# Patient Record
Sex: Female | Born: 1965 | Race: White | Hispanic: No | Marital: Married | Smoking: Never smoker
Health system: Southern US, Community
[De-identification: ages and names within clinical notes are randomized; demographics above are authoritative.]

## PROBLEM LIST (undated history)

## (undated) ENCOUNTER — Emergency Department (HOSPITAL_BASED_OUTPATIENT_CLINIC_OR_DEPARTMENT_OTHER): Discharge status: Absent without leave | Payer: PRIVATE HEALTH INSURANCE

## (undated) DIAGNOSIS — I2510 Atherosclerotic heart disease of native coronary artery without angina pectoris: Secondary | ICD-10-CM

## (undated) DIAGNOSIS — A6920 Lyme disease, unspecified: Secondary | ICD-10-CM

## (undated) DIAGNOSIS — R51 Headache: Secondary | ICD-10-CM

## (undated) DIAGNOSIS — E7800 Pure hypercholesterolemia, unspecified: Secondary | ICD-10-CM

## (undated) DIAGNOSIS — R519 Headache, unspecified: Secondary | ICD-10-CM

## (undated) DIAGNOSIS — F32A Depression, unspecified: Secondary | ICD-10-CM

## (undated) DIAGNOSIS — R011 Cardiac murmur, unspecified: Secondary | ICD-10-CM

## (undated) DIAGNOSIS — G8929 Other chronic pain: Secondary | ICD-10-CM

## (undated) DIAGNOSIS — K7460 Unspecified cirrhosis of liver: Secondary | ICD-10-CM

## (undated) DIAGNOSIS — F419 Anxiety disorder, unspecified: Secondary | ICD-10-CM

## (undated) DIAGNOSIS — H409 Unspecified glaucoma: Secondary | ICD-10-CM

## (undated) DIAGNOSIS — I509 Heart failure, unspecified: Secondary | ICD-10-CM

## (undated) DIAGNOSIS — J45909 Unspecified asthma, uncomplicated: Secondary | ICD-10-CM

## (undated) DIAGNOSIS — M549 Dorsalgia, unspecified: Secondary | ICD-10-CM

## (undated) DIAGNOSIS — F319 Bipolar disorder, unspecified: Secondary | ICD-10-CM

## (undated) DIAGNOSIS — M1990 Unspecified osteoarthritis, unspecified site: Secondary | ICD-10-CM

## (undated) DIAGNOSIS — N183 Chronic kidney disease, stage 3 unspecified: Secondary | ICD-10-CM

## (undated) DIAGNOSIS — F329 Major depressive disorder, single episode, unspecified: Secondary | ICD-10-CM

## (undated) DIAGNOSIS — Z9981 Dependence on supplemental oxygen: Secondary | ICD-10-CM

## (undated) DIAGNOSIS — Z9289 Personal history of other medical treatment: Secondary | ICD-10-CM

## (undated) DIAGNOSIS — M797 Fibromyalgia: Secondary | ICD-10-CM

## (undated) DIAGNOSIS — E1129 Type 2 diabetes mellitus with other diabetic kidney complication: Secondary | ICD-10-CM

## (undated) DIAGNOSIS — H3320 Serous retinal detachment, unspecified eye: Secondary | ICD-10-CM

## (undated) HISTORY — DX: Atherosclerotic heart disease of native coronary artery without angina pectoris: I25.10

## (undated) HISTORY — DX: Major depressive disorder, single episode, unspecified: F32.9

## (undated) HISTORY — DX: Type 2 diabetes mellitus with other diabetic kidney complication: E11.29

## (undated) HISTORY — DX: Depression, unspecified: F32.A

## (undated) HISTORY — DX: Serous retinal detachment, unspecified eye: H33.20

## (undated) HISTORY — DX: Lyme disease, unspecified: A69.20

## (undated) HISTORY — PX: TUBAL LIGATION: SHX77

## (undated) HISTORY — DX: Heart failure, unspecified: I50.9

## (undated) HISTORY — PX: RETINAL DETACHMENT SURGERY: SHX105

## (undated) HISTORY — PX: LEG AMPUTATION BELOW KNEE: SHX694

## (undated) HISTORY — DX: Unspecified cirrhosis of liver: K74.60

## (undated) HISTORY — PX: TONSILLECTOMY: SUR1361

## (undated) HISTORY — PX: LASIK: SHX215

## (undated) HISTORY — PX: CORONARY ANGIOPLASTY WITH STENT PLACEMENT: SHX49

---

## 2011-06-28 DIAGNOSIS — Z9289 Personal history of other medical treatment: Secondary | ICD-10-CM

## 2011-06-28 HISTORY — DX: Personal history of other medical treatment: Z92.89

## 2011-12-29 DIAGNOSIS — F329 Major depressive disorder, single episode, unspecified: Secondary | ICD-10-CM | POA: Diagnosis not present

## 2012-02-02 DIAGNOSIS — F329 Major depressive disorder, single episode, unspecified: Secondary | ICD-10-CM | POA: Diagnosis not present

## 2012-02-10 DIAGNOSIS — L650 Telogen effluvium: Secondary | ICD-10-CM | POA: Diagnosis not present

## 2012-02-17 DIAGNOSIS — Z538 Procedure and treatment not carried out for other reasons: Secondary | ICD-10-CM | POA: Diagnosis not present

## 2012-03-06 DIAGNOSIS — F329 Major depressive disorder, single episode, unspecified: Secondary | ICD-10-CM | POA: Diagnosis not present

## 2012-03-23 DIAGNOSIS — L219 Seborrheic dermatitis, unspecified: Secondary | ICD-10-CM | POA: Diagnosis not present

## 2012-06-06 DIAGNOSIS — F329 Major depressive disorder, single episode, unspecified: Secondary | ICD-10-CM | POA: Diagnosis not present

## 2012-06-06 DIAGNOSIS — E119 Type 2 diabetes mellitus without complications: Secondary | ICD-10-CM | POA: Diagnosis not present

## 2012-06-06 DIAGNOSIS — I10 Essential (primary) hypertension: Secondary | ICD-10-CM | POA: Diagnosis not present

## 2012-06-06 DIAGNOSIS — I509 Heart failure, unspecified: Secondary | ICD-10-CM | POA: Diagnosis not present

## 2012-06-06 DIAGNOSIS — S88119A Complete traumatic amputation at level between knee and ankle, unspecified lower leg, initial encounter: Secondary | ICD-10-CM | POA: Diagnosis not present

## 2012-06-08 DIAGNOSIS — S88119A Complete traumatic amputation at level between knee and ankle, unspecified lower leg, initial encounter: Secondary | ICD-10-CM | POA: Diagnosis not present

## 2012-06-08 DIAGNOSIS — F329 Major depressive disorder, single episode, unspecified: Secondary | ICD-10-CM | POA: Diagnosis not present

## 2012-06-08 DIAGNOSIS — E119 Type 2 diabetes mellitus without complications: Secondary | ICD-10-CM | POA: Diagnosis not present

## 2012-06-08 DIAGNOSIS — I509 Heart failure, unspecified: Secondary | ICD-10-CM | POA: Diagnosis not present

## 2012-06-08 DIAGNOSIS — I10 Essential (primary) hypertension: Secondary | ICD-10-CM | POA: Diagnosis not present

## 2012-06-10 DIAGNOSIS — I509 Heart failure, unspecified: Secondary | ICD-10-CM | POA: Diagnosis not present

## 2012-06-10 DIAGNOSIS — E119 Type 2 diabetes mellitus without complications: Secondary | ICD-10-CM | POA: Diagnosis not present

## 2012-06-10 DIAGNOSIS — S88119A Complete traumatic amputation at level between knee and ankle, unspecified lower leg, initial encounter: Secondary | ICD-10-CM | POA: Diagnosis not present

## 2012-06-10 DIAGNOSIS — F329 Major depressive disorder, single episode, unspecified: Secondary | ICD-10-CM | POA: Diagnosis not present

## 2012-06-10 DIAGNOSIS — I10 Essential (primary) hypertension: Secondary | ICD-10-CM | POA: Diagnosis not present

## 2012-06-12 DIAGNOSIS — S88119A Complete traumatic amputation at level between knee and ankle, unspecified lower leg, initial encounter: Secondary | ICD-10-CM | POA: Diagnosis not present

## 2012-06-12 DIAGNOSIS — E119 Type 2 diabetes mellitus without complications: Secondary | ICD-10-CM | POA: Diagnosis not present

## 2012-06-12 DIAGNOSIS — I509 Heart failure, unspecified: Secondary | ICD-10-CM | POA: Diagnosis not present

## 2012-06-12 DIAGNOSIS — F329 Major depressive disorder, single episode, unspecified: Secondary | ICD-10-CM | POA: Diagnosis not present

## 2012-06-12 DIAGNOSIS — I10 Essential (primary) hypertension: Secondary | ICD-10-CM | POA: Diagnosis not present

## 2012-06-14 DIAGNOSIS — F329 Major depressive disorder, single episode, unspecified: Secondary | ICD-10-CM | POA: Diagnosis not present

## 2012-06-14 DIAGNOSIS — I509 Heart failure, unspecified: Secondary | ICD-10-CM | POA: Diagnosis not present

## 2012-06-14 DIAGNOSIS — S88119A Complete traumatic amputation at level between knee and ankle, unspecified lower leg, initial encounter: Secondary | ICD-10-CM | POA: Diagnosis not present

## 2012-06-14 DIAGNOSIS — E119 Type 2 diabetes mellitus without complications: Secondary | ICD-10-CM | POA: Diagnosis not present

## 2012-06-14 DIAGNOSIS — I10 Essential (primary) hypertension: Secondary | ICD-10-CM | POA: Diagnosis not present

## 2012-06-25 DIAGNOSIS — I509 Heart failure, unspecified: Secondary | ICD-10-CM | POA: Diagnosis not present

## 2012-06-25 DIAGNOSIS — F329 Major depressive disorder, single episode, unspecified: Secondary | ICD-10-CM | POA: Diagnosis not present

## 2012-06-25 DIAGNOSIS — I10 Essential (primary) hypertension: Secondary | ICD-10-CM | POA: Diagnosis not present

## 2012-06-25 DIAGNOSIS — E119 Type 2 diabetes mellitus without complications: Secondary | ICD-10-CM | POA: Diagnosis not present

## 2012-06-25 DIAGNOSIS — S88119A Complete traumatic amputation at level between knee and ankle, unspecified lower leg, initial encounter: Secondary | ICD-10-CM | POA: Diagnosis not present

## 2012-06-28 DIAGNOSIS — E119 Type 2 diabetes mellitus without complications: Secondary | ICD-10-CM | POA: Diagnosis not present

## 2012-06-28 DIAGNOSIS — I509 Heart failure, unspecified: Secondary | ICD-10-CM | POA: Diagnosis not present

## 2012-06-28 DIAGNOSIS — F329 Major depressive disorder, single episode, unspecified: Secondary | ICD-10-CM | POA: Diagnosis not present

## 2012-06-28 DIAGNOSIS — S88119A Complete traumatic amputation at level between knee and ankle, unspecified lower leg, initial encounter: Secondary | ICD-10-CM | POA: Diagnosis not present

## 2012-06-28 DIAGNOSIS — I10 Essential (primary) hypertension: Secondary | ICD-10-CM | POA: Diagnosis not present

## 2012-07-02 DIAGNOSIS — F329 Major depressive disorder, single episode, unspecified: Secondary | ICD-10-CM | POA: Diagnosis not present

## 2012-07-02 DIAGNOSIS — E119 Type 2 diabetes mellitus without complications: Secondary | ICD-10-CM | POA: Diagnosis not present

## 2012-07-02 DIAGNOSIS — I10 Essential (primary) hypertension: Secondary | ICD-10-CM | POA: Diagnosis not present

## 2012-07-02 DIAGNOSIS — I509 Heart failure, unspecified: Secondary | ICD-10-CM | POA: Diagnosis not present

## 2012-07-02 DIAGNOSIS — S88119A Complete traumatic amputation at level between knee and ankle, unspecified lower leg, initial encounter: Secondary | ICD-10-CM | POA: Diagnosis not present

## 2012-07-11 DIAGNOSIS — E119 Type 2 diabetes mellitus without complications: Secondary | ICD-10-CM | POA: Diagnosis not present

## 2012-07-11 DIAGNOSIS — S88119A Complete traumatic amputation at level between knee and ankle, unspecified lower leg, initial encounter: Secondary | ICD-10-CM | POA: Diagnosis not present

## 2012-07-11 DIAGNOSIS — F329 Major depressive disorder, single episode, unspecified: Secondary | ICD-10-CM | POA: Diagnosis not present

## 2012-07-11 DIAGNOSIS — I509 Heart failure, unspecified: Secondary | ICD-10-CM | POA: Diagnosis not present

## 2012-07-11 DIAGNOSIS — I10 Essential (primary) hypertension: Secondary | ICD-10-CM | POA: Diagnosis not present

## 2012-07-16 DIAGNOSIS — E119 Type 2 diabetes mellitus without complications: Secondary | ICD-10-CM | POA: Diagnosis not present

## 2012-07-16 DIAGNOSIS — I509 Heart failure, unspecified: Secondary | ICD-10-CM | POA: Diagnosis not present

## 2012-07-16 DIAGNOSIS — F329 Major depressive disorder, single episode, unspecified: Secondary | ICD-10-CM | POA: Diagnosis not present

## 2012-07-16 DIAGNOSIS — S88119A Complete traumatic amputation at level between knee and ankle, unspecified lower leg, initial encounter: Secondary | ICD-10-CM | POA: Diagnosis not present

## 2012-07-16 DIAGNOSIS — I10 Essential (primary) hypertension: Secondary | ICD-10-CM | POA: Diagnosis not present

## 2012-07-23 DIAGNOSIS — F329 Major depressive disorder, single episode, unspecified: Secondary | ICD-10-CM | POA: Diagnosis not present

## 2012-07-23 DIAGNOSIS — S88119A Complete traumatic amputation at level between knee and ankle, unspecified lower leg, initial encounter: Secondary | ICD-10-CM | POA: Diagnosis not present

## 2012-07-23 DIAGNOSIS — I509 Heart failure, unspecified: Secondary | ICD-10-CM | POA: Diagnosis not present

## 2012-07-23 DIAGNOSIS — E119 Type 2 diabetes mellitus without complications: Secondary | ICD-10-CM | POA: Diagnosis not present

## 2012-07-23 DIAGNOSIS — I10 Essential (primary) hypertension: Secondary | ICD-10-CM | POA: Diagnosis not present

## 2012-08-03 DIAGNOSIS — I509 Heart failure, unspecified: Secondary | ICD-10-CM | POA: Diagnosis not present

## 2012-08-03 DIAGNOSIS — E119 Type 2 diabetes mellitus without complications: Secondary | ICD-10-CM | POA: Diagnosis not present

## 2012-08-03 DIAGNOSIS — I10 Essential (primary) hypertension: Secondary | ICD-10-CM | POA: Diagnosis not present

## 2012-08-03 DIAGNOSIS — F329 Major depressive disorder, single episode, unspecified: Secondary | ICD-10-CM | POA: Diagnosis not present

## 2012-08-03 DIAGNOSIS — S88119A Complete traumatic amputation at level between knee and ankle, unspecified lower leg, initial encounter: Secondary | ICD-10-CM | POA: Diagnosis not present

## 2012-08-05 DIAGNOSIS — I509 Heart failure, unspecified: Secondary | ICD-10-CM | POA: Diagnosis not present

## 2012-08-05 DIAGNOSIS — E119 Type 2 diabetes mellitus without complications: Secondary | ICD-10-CM | POA: Diagnosis not present

## 2012-08-05 DIAGNOSIS — I10 Essential (primary) hypertension: Secondary | ICD-10-CM | POA: Diagnosis not present

## 2012-08-05 DIAGNOSIS — F329 Major depressive disorder, single episode, unspecified: Secondary | ICD-10-CM | POA: Diagnosis not present

## 2012-08-05 DIAGNOSIS — S88119A Complete traumatic amputation at level between knee and ankle, unspecified lower leg, initial encounter: Secondary | ICD-10-CM | POA: Diagnosis not present

## 2012-08-05 DIAGNOSIS — H548 Legal blindness, as defined in USA: Secondary | ICD-10-CM | POA: Diagnosis not present

## 2012-08-06 DIAGNOSIS — I10 Essential (primary) hypertension: Secondary | ICD-10-CM | POA: Diagnosis not present

## 2012-08-06 DIAGNOSIS — E119 Type 2 diabetes mellitus without complications: Secondary | ICD-10-CM | POA: Diagnosis not present

## 2012-08-06 DIAGNOSIS — F329 Major depressive disorder, single episode, unspecified: Secondary | ICD-10-CM | POA: Diagnosis not present

## 2012-08-06 DIAGNOSIS — I509 Heart failure, unspecified: Secondary | ICD-10-CM | POA: Diagnosis not present

## 2012-08-06 DIAGNOSIS — S88119A Complete traumatic amputation at level between knee and ankle, unspecified lower leg, initial encounter: Secondary | ICD-10-CM | POA: Diagnosis not present

## 2012-08-06 DIAGNOSIS — H548 Legal blindness, as defined in USA: Secondary | ICD-10-CM | POA: Diagnosis not present

## 2012-08-13 DIAGNOSIS — I509 Heart failure, unspecified: Secondary | ICD-10-CM | POA: Diagnosis not present

## 2012-08-13 DIAGNOSIS — I10 Essential (primary) hypertension: Secondary | ICD-10-CM | POA: Diagnosis not present

## 2012-08-13 DIAGNOSIS — S88119A Complete traumatic amputation at level between knee and ankle, unspecified lower leg, initial encounter: Secondary | ICD-10-CM | POA: Diagnosis not present

## 2012-08-13 DIAGNOSIS — H548 Legal blindness, as defined in USA: Secondary | ICD-10-CM | POA: Diagnosis not present

## 2012-08-13 DIAGNOSIS — E119 Type 2 diabetes mellitus without complications: Secondary | ICD-10-CM | POA: Diagnosis not present

## 2012-08-13 DIAGNOSIS — F329 Major depressive disorder, single episode, unspecified: Secondary | ICD-10-CM | POA: Diagnosis not present

## 2012-08-20 DIAGNOSIS — S88119A Complete traumatic amputation at level between knee and ankle, unspecified lower leg, initial encounter: Secondary | ICD-10-CM | POA: Diagnosis not present

## 2012-09-17 DIAGNOSIS — I10 Essential (primary) hypertension: Secondary | ICD-10-CM | POA: Diagnosis not present

## 2012-09-17 DIAGNOSIS — F329 Major depressive disorder, single episode, unspecified: Secondary | ICD-10-CM | POA: Diagnosis not present

## 2012-09-17 DIAGNOSIS — I509 Heart failure, unspecified: Secondary | ICD-10-CM | POA: Diagnosis not present

## 2012-09-17 DIAGNOSIS — H548 Legal blindness, as defined in USA: Secondary | ICD-10-CM | POA: Diagnosis not present

## 2012-09-17 DIAGNOSIS — E119 Type 2 diabetes mellitus without complications: Secondary | ICD-10-CM | POA: Diagnosis not present

## 2012-09-17 DIAGNOSIS — S88119A Complete traumatic amputation at level between knee and ankle, unspecified lower leg, initial encounter: Secondary | ICD-10-CM | POA: Diagnosis not present

## 2012-09-25 DIAGNOSIS — F329 Major depressive disorder, single episode, unspecified: Secondary | ICD-10-CM | POA: Diagnosis not present

## 2012-09-25 DIAGNOSIS — I10 Essential (primary) hypertension: Secondary | ICD-10-CM | POA: Diagnosis not present

## 2012-09-25 DIAGNOSIS — H548 Legal blindness, as defined in USA: Secondary | ICD-10-CM | POA: Diagnosis not present

## 2012-09-25 DIAGNOSIS — I509 Heart failure, unspecified: Secondary | ICD-10-CM | POA: Diagnosis not present

## 2012-09-25 DIAGNOSIS — S88119A Complete traumatic amputation at level between knee and ankle, unspecified lower leg, initial encounter: Secondary | ICD-10-CM | POA: Diagnosis not present

## 2012-09-25 DIAGNOSIS — E119 Type 2 diabetes mellitus without complications: Secondary | ICD-10-CM | POA: Diagnosis not present

## 2012-10-01 DIAGNOSIS — I509 Heart failure, unspecified: Secondary | ICD-10-CM | POA: Diagnosis not present

## 2012-10-01 DIAGNOSIS — E119 Type 2 diabetes mellitus without complications: Secondary | ICD-10-CM | POA: Diagnosis not present

## 2012-10-01 DIAGNOSIS — H548 Legal blindness, as defined in USA: Secondary | ICD-10-CM | POA: Diagnosis not present

## 2012-10-01 DIAGNOSIS — F329 Major depressive disorder, single episode, unspecified: Secondary | ICD-10-CM | POA: Diagnosis not present

## 2012-10-01 DIAGNOSIS — I10 Essential (primary) hypertension: Secondary | ICD-10-CM | POA: Diagnosis not present

## 2012-10-01 DIAGNOSIS — S88119A Complete traumatic amputation at level between knee and ankle, unspecified lower leg, initial encounter: Secondary | ICD-10-CM | POA: Diagnosis not present

## 2012-12-03 DIAGNOSIS — I509 Heart failure, unspecified: Secondary | ICD-10-CM | POA: Diagnosis not present

## 2012-12-03 DIAGNOSIS — F319 Bipolar disorder, unspecified: Secondary | ICD-10-CM | POA: Diagnosis not present

## 2012-12-03 DIAGNOSIS — H548 Legal blindness, as defined in USA: Secondary | ICD-10-CM | POA: Diagnosis not present

## 2012-12-03 DIAGNOSIS — E119 Type 2 diabetes mellitus without complications: Secondary | ICD-10-CM | POA: Diagnosis not present

## 2012-12-03 DIAGNOSIS — I2510 Atherosclerotic heart disease of native coronary artery without angina pectoris: Secondary | ICD-10-CM | POA: Diagnosis not present

## 2012-12-03 DIAGNOSIS — S88119A Complete traumatic amputation at level between knee and ankle, unspecified lower leg, initial encounter: Secondary | ICD-10-CM | POA: Diagnosis not present

## 2012-12-10 DIAGNOSIS — F319 Bipolar disorder, unspecified: Secondary | ICD-10-CM | POA: Diagnosis not present

## 2012-12-10 DIAGNOSIS — I2510 Atherosclerotic heart disease of native coronary artery without angina pectoris: Secondary | ICD-10-CM | POA: Diagnosis not present

## 2012-12-10 DIAGNOSIS — S88119A Complete traumatic amputation at level between knee and ankle, unspecified lower leg, initial encounter: Secondary | ICD-10-CM | POA: Diagnosis not present

## 2012-12-10 DIAGNOSIS — E119 Type 2 diabetes mellitus without complications: Secondary | ICD-10-CM | POA: Diagnosis not present

## 2012-12-10 DIAGNOSIS — I509 Heart failure, unspecified: Secondary | ICD-10-CM | POA: Diagnosis not present

## 2012-12-10 DIAGNOSIS — H548 Legal blindness, as defined in USA: Secondary | ICD-10-CM | POA: Diagnosis not present

## 2012-12-24 DIAGNOSIS — I509 Heart failure, unspecified: Secondary | ICD-10-CM | POA: Diagnosis not present

## 2012-12-24 DIAGNOSIS — H548 Legal blindness, as defined in USA: Secondary | ICD-10-CM | POA: Diagnosis not present

## 2012-12-24 DIAGNOSIS — S88119A Complete traumatic amputation at level between knee and ankle, unspecified lower leg, initial encounter: Secondary | ICD-10-CM | POA: Diagnosis not present

## 2012-12-24 DIAGNOSIS — E119 Type 2 diabetes mellitus without complications: Secondary | ICD-10-CM | POA: Diagnosis not present

## 2012-12-24 DIAGNOSIS — F319 Bipolar disorder, unspecified: Secondary | ICD-10-CM | POA: Diagnosis not present

## 2012-12-24 DIAGNOSIS — I2510 Atherosclerotic heart disease of native coronary artery without angina pectoris: Secondary | ICD-10-CM | POA: Diagnosis not present

## 2012-12-31 DIAGNOSIS — I509 Heart failure, unspecified: Secondary | ICD-10-CM | POA: Diagnosis not present

## 2012-12-31 DIAGNOSIS — S88119A Complete traumatic amputation at level between knee and ankle, unspecified lower leg, initial encounter: Secondary | ICD-10-CM | POA: Diagnosis not present

## 2012-12-31 DIAGNOSIS — H548 Legal blindness, as defined in USA: Secondary | ICD-10-CM | POA: Diagnosis not present

## 2012-12-31 DIAGNOSIS — I2510 Atherosclerotic heart disease of native coronary artery without angina pectoris: Secondary | ICD-10-CM | POA: Diagnosis not present

## 2012-12-31 DIAGNOSIS — E119 Type 2 diabetes mellitus without complications: Secondary | ICD-10-CM | POA: Diagnosis not present

## 2012-12-31 DIAGNOSIS — F319 Bipolar disorder, unspecified: Secondary | ICD-10-CM | POA: Diagnosis not present

## 2013-01-07 DIAGNOSIS — I509 Heart failure, unspecified: Secondary | ICD-10-CM | POA: Diagnosis not present

## 2013-01-07 DIAGNOSIS — H548 Legal blindness, as defined in USA: Secondary | ICD-10-CM | POA: Diagnosis not present

## 2013-01-07 DIAGNOSIS — F319 Bipolar disorder, unspecified: Secondary | ICD-10-CM | POA: Diagnosis not present

## 2013-01-07 DIAGNOSIS — S88119A Complete traumatic amputation at level between knee and ankle, unspecified lower leg, initial encounter: Secondary | ICD-10-CM | POA: Diagnosis not present

## 2013-01-07 DIAGNOSIS — I2510 Atherosclerotic heart disease of native coronary artery without angina pectoris: Secondary | ICD-10-CM | POA: Diagnosis not present

## 2013-01-07 DIAGNOSIS — E119 Type 2 diabetes mellitus without complications: Secondary | ICD-10-CM | POA: Diagnosis not present

## 2013-01-14 DIAGNOSIS — I509 Heart failure, unspecified: Secondary | ICD-10-CM | POA: Diagnosis not present

## 2013-01-14 DIAGNOSIS — E119 Type 2 diabetes mellitus without complications: Secondary | ICD-10-CM | POA: Diagnosis not present

## 2013-01-14 DIAGNOSIS — F319 Bipolar disorder, unspecified: Secondary | ICD-10-CM | POA: Diagnosis not present

## 2013-01-14 DIAGNOSIS — H548 Legal blindness, as defined in USA: Secondary | ICD-10-CM | POA: Diagnosis not present

## 2013-01-14 DIAGNOSIS — S88119A Complete traumatic amputation at level between knee and ankle, unspecified lower leg, initial encounter: Secondary | ICD-10-CM | POA: Diagnosis not present

## 2013-01-14 DIAGNOSIS — I2510 Atherosclerotic heart disease of native coronary artery without angina pectoris: Secondary | ICD-10-CM | POA: Diagnosis not present

## 2013-01-23 DIAGNOSIS — S88119A Complete traumatic amputation at level between knee and ankle, unspecified lower leg, initial encounter: Secondary | ICD-10-CM | POA: Diagnosis not present

## 2013-01-23 DIAGNOSIS — I2510 Atherosclerotic heart disease of native coronary artery without angina pectoris: Secondary | ICD-10-CM | POA: Diagnosis not present

## 2013-01-23 DIAGNOSIS — F319 Bipolar disorder, unspecified: Secondary | ICD-10-CM | POA: Diagnosis not present

## 2013-01-23 DIAGNOSIS — E119 Type 2 diabetes mellitus without complications: Secondary | ICD-10-CM | POA: Diagnosis not present

## 2013-01-23 DIAGNOSIS — I509 Heart failure, unspecified: Secondary | ICD-10-CM | POA: Diagnosis not present

## 2013-01-23 DIAGNOSIS — H548 Legal blindness, as defined in USA: Secondary | ICD-10-CM | POA: Diagnosis not present

## 2013-01-31 DIAGNOSIS — H548 Legal blindness, as defined in USA: Secondary | ICD-10-CM | POA: Diagnosis not present

## 2013-01-31 DIAGNOSIS — I509 Heart failure, unspecified: Secondary | ICD-10-CM | POA: Diagnosis not present

## 2013-01-31 DIAGNOSIS — E119 Type 2 diabetes mellitus without complications: Secondary | ICD-10-CM | POA: Diagnosis not present

## 2013-01-31 DIAGNOSIS — S88119A Complete traumatic amputation at level between knee and ankle, unspecified lower leg, initial encounter: Secondary | ICD-10-CM | POA: Diagnosis not present

## 2013-01-31 DIAGNOSIS — I2510 Atherosclerotic heart disease of native coronary artery without angina pectoris: Secondary | ICD-10-CM | POA: Diagnosis not present

## 2013-01-31 DIAGNOSIS — F319 Bipolar disorder, unspecified: Secondary | ICD-10-CM | POA: Diagnosis not present

## 2013-08-26 DIAGNOSIS — I2589 Other forms of chronic ischemic heart disease: Secondary | ICD-10-CM | POA: Diagnosis not present

## 2013-08-26 DIAGNOSIS — I5022 Chronic systolic (congestive) heart failure: Secondary | ICD-10-CM | POA: Diagnosis not present

## 2013-08-26 DIAGNOSIS — I509 Heart failure, unspecified: Secondary | ICD-10-CM | POA: Diagnosis not present

## 2013-08-26 DIAGNOSIS — R0602 Shortness of breath: Secondary | ICD-10-CM | POA: Diagnosis not present

## 2013-08-27 DIAGNOSIS — I10 Essential (primary) hypertension: Secondary | ICD-10-CM | POA: Diagnosis not present

## 2013-08-27 DIAGNOSIS — I2510 Atherosclerotic heart disease of native coronary artery without angina pectoris: Secondary | ICD-10-CM | POA: Diagnosis not present

## 2013-08-27 DIAGNOSIS — I5022 Chronic systolic (congestive) heart failure: Secondary | ICD-10-CM | POA: Diagnosis not present

## 2013-08-27 DIAGNOSIS — E119 Type 2 diabetes mellitus without complications: Secondary | ICD-10-CM | POA: Diagnosis not present

## 2013-08-27 DIAGNOSIS — R0602 Shortness of breath: Secondary | ICD-10-CM | POA: Diagnosis not present

## 2013-08-27 DIAGNOSIS — I509 Heart failure, unspecified: Secondary | ICD-10-CM | POA: Diagnosis not present

## 2013-10-31 DIAGNOSIS — I2510 Atherosclerotic heart disease of native coronary artery without angina pectoris: Secondary | ICD-10-CM | POA: Diagnosis not present

## 2013-10-31 DIAGNOSIS — S88119A Complete traumatic amputation at level between knee and ankle, unspecified lower leg, initial encounter: Secondary | ICD-10-CM | POA: Diagnosis not present

## 2013-10-31 DIAGNOSIS — E1159 Type 2 diabetes mellitus with other circulatory complications: Secondary | ICD-10-CM | POA: Diagnosis not present

## 2013-10-31 DIAGNOSIS — I10 Essential (primary) hypertension: Secondary | ICD-10-CM | POA: Diagnosis not present

## 2014-06-25 DIAGNOSIS — I5022 Chronic systolic (congestive) heart failure: Secondary | ICD-10-CM | POA: Diagnosis not present

## 2014-06-25 DIAGNOSIS — E1165 Type 2 diabetes mellitus with hyperglycemia: Secondary | ICD-10-CM | POA: Diagnosis not present

## 2014-06-25 DIAGNOSIS — R0602 Shortness of breath: Secondary | ICD-10-CM | POA: Diagnosis not present

## 2014-06-25 DIAGNOSIS — I2510 Atherosclerotic heart disease of native coronary artery without angina pectoris: Secondary | ICD-10-CM | POA: Diagnosis not present

## 2014-09-09 ENCOUNTER — Ambulatory Visit: Payer: Self-pay | Admitting: Family Medicine

## 2014-09-12 DIAGNOSIS — E876 Hypokalemia: Secondary | ICD-10-CM | POA: Diagnosis not present

## 2014-09-12 DIAGNOSIS — K5900 Constipation, unspecified: Secondary | ICD-10-CM | POA: Diagnosis not present

## 2014-09-12 DIAGNOSIS — H6090 Unspecified otitis externa, unspecified ear: Secondary | ICD-10-CM | POA: Diagnosis not present

## 2014-09-12 DIAGNOSIS — L723 Sebaceous cyst: Secondary | ICD-10-CM | POA: Diagnosis not present

## 2014-09-12 DIAGNOSIS — E1159 Type 2 diabetes mellitus with other circulatory complications: Secondary | ICD-10-CM | POA: Diagnosis not present

## 2014-10-09 DIAGNOSIS — N19 Unspecified kidney failure: Secondary | ICD-10-CM | POA: Diagnosis not present

## 2014-10-09 DIAGNOSIS — I5022 Chronic systolic (congestive) heart failure: Secondary | ICD-10-CM | POA: Diagnosis not present

## 2014-11-28 ENCOUNTER — Encounter: Payer: Self-pay | Admitting: Family Medicine

## 2014-11-28 ENCOUNTER — Ambulatory Visit (INDEPENDENT_AMBULATORY_CARE_PROVIDER_SITE_OTHER): Payer: Medicare Other | Admitting: Family Medicine

## 2014-11-28 VITALS — BP 110/80 | HR 95 | Temp 98.7°F

## 2014-11-28 DIAGNOSIS — E1121 Type 2 diabetes mellitus with diabetic nephropathy: Secondary | ICD-10-CM | POA: Diagnosis not present

## 2014-11-28 DIAGNOSIS — I5023 Acute on chronic systolic (congestive) heart failure: Secondary | ICD-10-CM | POA: Insufficient documentation

## 2014-11-28 DIAGNOSIS — K7460 Unspecified cirrhosis of liver: Secondary | ICD-10-CM

## 2014-11-28 DIAGNOSIS — Z89512 Acquired absence of left leg below knee: Secondary | ICD-10-CM

## 2014-11-28 DIAGNOSIS — H548 Legal blindness, as defined in USA: Secondary | ICD-10-CM

## 2014-11-28 DIAGNOSIS — I5022 Chronic systolic (congestive) heart failure: Secondary | ICD-10-CM

## 2014-11-28 DIAGNOSIS — Z89511 Acquired absence of right leg below knee: Secondary | ICD-10-CM

## 2014-11-28 DIAGNOSIS — M25569 Pain in unspecified knee: Secondary | ICD-10-CM

## 2014-11-28 DIAGNOSIS — I25810 Atherosclerosis of coronary artery bypass graft(s) without angina pectoris: Secondary | ICD-10-CM | POA: Diagnosis not present

## 2014-11-28 DIAGNOSIS — M159 Polyosteoarthritis, unspecified: Secondary | ICD-10-CM | POA: Diagnosis not present

## 2014-11-28 NOTE — Progress Notes (Signed)
HPI:  Sue Martin is here to establish care. [place]   Has the following chronic problems that require follow up and concerns today:  DM: -complications: s/p bilat LE amputation and on disability for this - reports spent 45 days at [facility] 2012 for gangrene, PVD, Renal Failure, diabetic retinopathy -meds:lantus 40 U bid, lispro 15 units per meal -fasting BS in 250-400 chronically per her report -she is frustrated with this, agreeable to seeing endocrinologist -sees optho/legally blind: sees Dr Johna SheriffPeter Rogaski  CKD: -from her diabetes  -on dialysis in the past -reports needs a nephrologist but prior doc did not refer her, wants referral -reports her potassium is always low - has required potassium supplementation in the past but she does not tolerated these well and reports will not take -rarely uses ibuprofen  Hx of CAD, CHF: -S/p stenting Jan of 2007 [facility] [place], [place] -[place] cardi[place] in [place] now Dr. Wynonia HazardKhawaja - reports EF is around 4341 now and has appointment coming up -meds: asa, plavix, lipitor, lisinopril, lasix, metalazone, spironolactone  OA: -knees and back -takes ibuprofen rarely for this  Chronic muscle spasm around knees: -reports since amputation -wants referral to PT  Liver cirrhosis: -reports diagnosed in 2013, reports told this was from her diabetes -reports had extensive testing for hepatitis and does not have hepatitis  Chronic allergies: -takes benadryl at night -reports will not use nose sprays -chronic nasal congestion and fullness and popping in ears  ROS negative for unless reported above: fevers, unintentional weight loss, hearing or vision loss, chest pain, palpitations, struggling to breath, hemoptysis, melena, hematochezia, hematuria, falls, loc, si, thoughts of self harm  Past Medical History  Diagnosis Date  . Diabetes mellitus with renal complications   . CAD  (coronary artery disease)   . CHF (congestive heart failure)   . Arteriosclerosis   . Chronic kidney disease   . Liver cirrhosis   . Retinal detachment   . Lyme disease   . Glaucoma     Past Surgical History  Procedure Laterality Date  . Leg amputation below knee  09/09/2011, 09/11/2011  . Tubal ligation    . Lasik      Family History  Problem Relation Age of Onset  . Arthritis Mother   . Arthritis Maternal Grandmother   . Arthritis Father   . Heart disease Mother   . CVA Father   . Hypertension Father   . Sudden death Paternal Grandfather   . Mental illness Mother   . Diabetes Mother     History   Social History  . Marital Status: Married    Spouse Name: N/A  . Number of Children: N/A  . Years of Education: N/A   Social History Main Topics  . Smoking status: Never Smoker   . Smokeless tobacco: Not on file  . Alcohol Use: No  . Drug Use: No  . Sexual Activity: Not on file   Other Topics Concern  . None   Social History Narrative     Current outpatient prescriptions:  .  aspirin 325 MG tablet, Take 325 mg by mouth., Disp: , Rfl:  .  atorvastatin (LIPITOR) 10 MG tablet, Take 10 mg by mouth daily., Disp: , Rfl:  .  clopidogrel (PLAVIX) 75 MG tablet, Take 75 mg by mouth daily. , Disp: , Rfl:  .  cyclobenzaprine (FLEXERIL) 10 MG tablet, Take 10 mg by mouth as needed. , Disp: , Rfl:  .  diphenhydrAMINE (BENADRYL) 50 MG capsule, Take 50 mg by mouth daily. , Disp: , Rfl:  .  glucose blood test strip, Check sugars four times daily   Dx E11.9, Disp: , Rfl:  .  insulin glargine (LANTUS) 100 UNIT/ML injection, Inject 40 Units into the skin 2 (two) times daily., Disp: , Rfl:  .  insulin lispro (HUMALOG) 100 UNIT/ML injection, Inject 15 Units into the skin 3 (three) times daily with meals. , Disp: , Rfl:  .  lisinopril (PRINIVIL,ZESTRIL) 10 MG tablet, Take 10 mg by mouth., Disp: , Rfl:  .  metolazone (ZAROXOLYN) 5 MG tablet, Take by mouth daily. 30 minutes before Demadex  dose, Disp: , Rfl:  .  spironolactone (ALDACTONE) 25 MG tablet, Take 25 mg by mouth daily., Disp: , Rfl:  .  torsemide (DEMADEX) 20 MG tablet, TAKE ONE TABLET BY MOUTH TWICE DAILY, Disp: , Rfl:  .  ibuprofen (ADVIL,MOTRIN) 200 MG tablet, Take 200 mg by mouth as needed., Disp: , Rfl:   EXAM:  Filed Vitals:   11/28/14 1419  BP: 110/80  Pulse: 95  Temp: 98.7 F (37.1 C)    There is no height or weight on file to calculate BMI.  GENERAL: vitals reviewed and listed above, alert, oriented, appears well hydrated and in no acute distress  HEENT: atraumatic, conjunttiva clear, no obvious abnormalities on inspection of external nose and ears  NECK: no obvious masses on inspection  LUNGS: clear to auscultation bilaterally, no wheezes, rales or rhonchi, good air movement  CV: HRRR, no peripheral edema  MS: bilateral BKA  PSYCH: pleasant and cooperative, no obvious depression or anxiety  ASSESSMENT AND PLAN:  Discussed the following assessment and plan:  >45 minutes spent with this patient with > 50% face to face in counseling of this patient. She has a very complicated health history which we attempted to review and update. We plan to start with some basic labs today and have her see the endocrinologist for her uncontrolled diabetes. We will have her follow up relatively closely after attempting to obtain records so that we can continue to learn more about her health history and help her with her healthcare needs.  Type 2 diabetes mellitus with diabetic nephropathy - Plan: CMP, Hemoglobin A1c -referred per her request to endocrinologist for management of her severe, uncontrolled diabetes with multiple complications  CHF Coronary artery disease involving coronary bypass graft of native heart without angina pectoris - Plan: CMP, Lipid Panel -advised she have her cardiologist send records and that her cardiologist manage her heart medications  Generalized OA Caution with nsaids given  renal disease.  Cirrhosis of liver without ascites, unspecified hepatic cirrhosis type Caution with liver toxic drugs.  -We reviewed the PMH, PSH, FH, SH, Meds and Allergies. -We provided refills for any medications we will prescribe as needed. -We addressed current concerns per orders and patient instructions. -We have asked for records for pertinent exams, studies, vaccines and notes from previous providers. -We have advised patient to follow up per instructions below.   -Patient advised to return or notify a doctor immediately if symptoms worsen or persist or new concerns arise.  Patient Instructions  BEFORE YOU LEAVE: -schedule fasting lab appointment in the next 1-2 weeks -schedule follow up with me in 2-3 months  -We placed a referral for you as discussed to the endocrinologist and to the physical therapist. It usually takes about 1-2 weeks to process and schedule this referral. If you have not heard from [place] regarding  this appointment in 2 weeks please contact our office.  Stop the benadryl and start zyrtec daily - this is available over the counter  .      Kriste Basque R.

## 2014-11-28 NOTE — Progress Notes (Signed)
Pre visit review using our clinic review tool, if applicable. No additional management support is needed unless otherwise documented below in the visit note. 

## 2014-11-28 NOTE — Patient Instructions (Signed)
BEFORE YOU LEAVE: -schedule fasting lab appointment in the next 1-2 weeks -schedule follow up with me in 2-3 months  -We placed a referral for you as discussed to the endocrinologist and to the physical therapist. It usually takes about 1-2 weeks to process and schedule this referral. If you have not heard from us regarding this appointment in 2 weeks please contact our office.  Stop the benadryl and start zyrtec daily - this is available over the counter  .

## 2014-12-01 ENCOUNTER — Other Ambulatory Visit (INDEPENDENT_AMBULATORY_CARE_PROVIDER_SITE_OTHER): Payer: Medicare Other

## 2014-12-01 DIAGNOSIS — I25810 Atherosclerosis of coronary artery bypass graft(s) without angina pectoris: Secondary | ICD-10-CM | POA: Diagnosis not present

## 2014-12-01 DIAGNOSIS — E1121 Type 2 diabetes mellitus with diabetic nephropathy: Secondary | ICD-10-CM | POA: Diagnosis not present

## 2014-12-01 LAB — COMPREHENSIVE METABOLIC PANEL
ALT: 10 U/L (ref 0–35)
AST: 13 U/L (ref 0–37)
Albumin: 3.8 g/dL (ref 3.5–5.2)
Alkaline Phosphatase: 67 U/L (ref 39–117)
BUN: 53 mg/dL — ABNORMAL HIGH (ref 6–23)
CALCIUM: 9.3 mg/dL (ref 8.4–10.5)
CHLORIDE: 104 meq/L (ref 96–112)
CO2: 27 mEq/L (ref 19–32)
CREATININE: 1.86 mg/dL — AB (ref 0.40–1.20)
GFR: 30.75 mL/min — ABNORMAL LOW (ref >60.00)
Glucose, Bld: 181 mg/dL — ABNORMAL HIGH (ref 70–99)
Potassium: 4.8 mEq/L (ref 3.5–5.1)
Sodium: 139 mEq/L (ref 135–145)
Total Bilirubin: 0.5 mg/dL (ref 0.2–1.2)
Total Protein: 6.7 g/dL (ref 6.0–8.3)

## 2014-12-01 LAB — HEMOGLOBIN A1C: Hgb A1c MFr Bld: 8.9 % — ABNORMAL HIGH (ref 4.6–6.5)

## 2014-12-01 LAB — LIPID PANEL
Cholesterol: 161 mg/dL (ref 0–200)
HDL: 34.8 mg/dL — ABNORMAL LOW
LDL Cholesterol: 97 mg/dL (ref 0–99)
NonHDL: 126.2
Total CHOL/HDL Ratio: 5
Triglycerides: 147 mg/dL (ref 0.0–149.0)
VLDL: 29.4 mg/dL (ref 0.0–40.0)

## 2014-12-08 ENCOUNTER — Other Ambulatory Visit: Payer: Self-pay | Admitting: Family Medicine

## 2014-12-08 DIAGNOSIS — N183 Chronic kidney disease, stage 3 unspecified: Secondary | ICD-10-CM

## 2014-12-10 ENCOUNTER — Ambulatory Visit: Payer: Medicare Other

## 2014-12-12 ENCOUNTER — Encounter: Payer: Self-pay | Admitting: Family Medicine

## 2014-12-12 DIAGNOSIS — I739 Peripheral vascular disease, unspecified: Secondary | ICD-10-CM | POA: Insufficient documentation

## 2014-12-12 DIAGNOSIS — E11319 Type 2 diabetes mellitus with unspecified diabetic retinopathy without macular edema: Secondary | ICD-10-CM | POA: Insufficient documentation

## 2014-12-12 DIAGNOSIS — I2510 Atherosclerotic heart disease of native coronary artery without angina pectoris: Secondary | ICD-10-CM

## 2014-12-12 DIAGNOSIS — N183 Chronic kidney disease, stage 3 unspecified: Secondary | ICD-10-CM | POA: Insufficient documentation

## 2014-12-12 HISTORY — DX: Atherosclerotic heart disease of native coronary artery without angina pectoris: I25.10

## 2014-12-22 ENCOUNTER — Ambulatory Visit (INDEPENDENT_AMBULATORY_CARE_PROVIDER_SITE_OTHER): Payer: Medicare Other | Admitting: Endocrinology

## 2014-12-22 ENCOUNTER — Encounter: Payer: Self-pay | Admitting: Endocrinology

## 2014-12-22 VITALS — BP 120/80 | HR 100 | Temp 99.1°F | Resp 16 | Ht <= 58 in

## 2014-12-22 DIAGNOSIS — I25810 Atherosclerosis of coronary artery bypass graft(s) without angina pectoris: Secondary | ICD-10-CM

## 2014-12-22 DIAGNOSIS — E1139 Type 2 diabetes mellitus with other diabetic ophthalmic complication: Secondary | ICD-10-CM | POA: Diagnosis not present

## 2014-12-22 DIAGNOSIS — E785 Hyperlipidemia, unspecified: Secondary | ICD-10-CM | POA: Diagnosis not present

## 2014-12-22 DIAGNOSIS — E1165 Type 2 diabetes mellitus with hyperglycemia: Secondary | ICD-10-CM

## 2014-12-22 DIAGNOSIS — N183 Chronic kidney disease, stage 3 unspecified: Secondary | ICD-10-CM

## 2014-12-22 DIAGNOSIS — IMO0002 Reserved for concepts with insufficient information to code with codable children: Secondary | ICD-10-CM

## 2014-12-22 LAB — POCT URINALYSIS DIPSTICK
Bilirubin, UA: NEGATIVE
Blood, UA: NEGATIVE
GLUCOSE UA: 250
Ketones, UA: NEGATIVE
Leukocytes, UA: NEGATIVE
NITRITE UA: NEGATIVE
Spec Grav, UA: 1.015
UROBILINOGEN UA: 0.2
pH, UA: 6

## 2014-12-22 LAB — MICROALBUMIN / CREATININE URINE RATIO
CREATININE, U: 102.6 mg/dL
Microalb Creat Ratio: 17.4 mg/g (ref 0.0–30.0)
Microalb, Ur: 17.8 mg/dL — ABNORMAL HIGH (ref 0.0–1.9)

## 2014-12-22 MED ORDER — DULAGLUTIDE 0.75 MG/0.5ML ~~LOC~~ SOAJ: SUBCUTANEOUS | Status: DC

## 2014-12-22 NOTE — Progress Notes (Signed)
Patient ID: Sue Martin, female   DOB: [birth year], 49 y.o.   MRN: [id number]           Reason for Appointment: Consultation for Type 2 Diabetes  Referring physician: None  History of Present Illness:          Date of diagnosis of type 2 diabetes mellitus: ?  2007        Background history:  She had gestational diabetes several years ago Subsequently she was not monitored and apparently in 2007 she was diagnosed to have diabetes when she was being evaluated for cardiac problems Pneumonia.  Her blood sugar was about 700 She was started on insulin and has only been on insulin since diagnosis She thinks her blood sugars have been usually very difficult to control and has been generally followed by her primary care physician only Her A1c record indicates her level has been mostly around 10-11 in 2015 and early 2016  Recent history:  Her insulin dose has been continued unchanged recently and she has been referred here when her A1c was 8.9 earlier this month  INSULIN regimen is described as: 40 units of Lantus bid Humalog 15 units with meals     Current blood sugar patterns and problems identified:  She is taking her Humalog usually about an hour or 2 after eating since she was instructed to do so in the hospital  Although she is checking her blood sugars 4 times a day she has to use a talking meter and is not able to keep a record  She thinks her blood sugars are mostly around 250 but generally higher towards bedtime and also if she checks them after meals  She does not adjust her Humalog based on what she is eating She tends to get more fatigue when her blood sugars are relatively higher: Usually tries to drink water when she is thirsty and avoid drinks with sugar  Compliance with the medical regimen: Fair Hypoglycemia: never   Glucose monitoring:  done 3-4 times a day         Glucometer: Prodigy.      Blood Glucose readings by recall   PREMEAL Breakfast Lunch Dinner Bedtime   Overall   Glucose range: 250  250   250  300-450   Median:        Self-care: The diet that the patient has been following is: tries to limit portions .     Meal times: Breakfast: 10 am Lunch: 3-4 pm, usually not eating a full dinner   Typical meal intake: Breakfast is his usually a granola bar or light yogurt, lunch may be sandwich or pasta and soups, dinner is usually yogurt or other snacks like fruit or sugar-free cookies               Dietician visit, most recent: none, only in the hospital               Exercise:  none  Weight history: Around the time of diagnosis overweight was 350, recently has been fairly stable around 175  Wt Readings from Last 3 Encounters:  No data found for Wt    Glycemic control:   Lab Results  Component Value Date   HGBA1C 8.9* 12/01/2014   Lab Results  Component Value Date   MICROALBUR 17.8* 12/22/2014   LDLCALC 97 12/01/2014   CREATININE 1.86* 12/01/2014         Medication List       This list is accurate  as of: 12/22/14  8:38 PM.  Always use your most recent med list.               aspirin 325 MG tablet  Take 325 mg by mouth.     atorvastatin 10 MG tablet  Commonly known as:  LIPITOR  Take 10 mg by mouth daily.     clopidogrel 75 MG tablet  Commonly known as:  PLAVIX  Take 75 mg by mouth daily.     cyclobenzaprine 10 MG tablet  Commonly known as:  FLEXERIL  Take 10 mg by mouth as needed.     diphenhydrAMINE 50 MG capsule  Commonly known as:  BENADRYL  Take 50 mg by mouth daily.     Dulaglutide 0.75 MG/0.5ML Sopn  Commonly known as:  TRULICITY  Inject once weekly     glucose blood test strip  Check sugars four times daily   Dx E11.9     ibuprofen 200 MG tablet  Commonly known as:  ADVIL,MOTRIN  Take 200 mg by mouth as needed.     insulin glargine 100 UNIT/ML injection  Commonly known as:  LANTUS  Inject 40 Units into the skin 2 (two) times daily.     LANTUS SOLOSTAR 100 UNIT/ML Solostar Pen  Generic drug:   Insulin Glargine  Inject 100 mLs into the skin 2 (two) times daily.     insulin lispro 100 UNIT/ML injection  Commonly known as:  HUMALOG  Inject 15 Units into the skin 3 (three) times daily with meals.     lisinopril 10 MG tablet  Commonly known as:  PRINIVIL,ZESTRIL  Take 10 mg by mouth.     metolazone 5 MG tablet  Commonly known as:  ZAROXOLYN  Take by mouth daily. 30 minutes before Demadex dose     spironolactone 25 MG tablet  Commonly known as:  ALDACTONE  Take 25 mg by mouth daily.     torsemide 20 MG tablet  Commonly known as:  DEMADEX  TAKE ONE TABLET BY MOUTH TWICE DAILY        Allergies:  Allergies  Allergen Reactions  . Broccoli [Brassica Oleracea Italica] Anaphylaxis  . Iron Other (See Comments)    GI intolerance  . Latex Other (See Comments)    blisters  . Penicillins Other (See Comments)    GI intolerance  . Codeine Nausea And Vomiting and Rash  . Sulfa Antibiotics Nausea And Vomiting and Rash    Past Medical History  Diagnosis Date  . Diabetes mellitus with renal complications     PVD and retinopathy  . CHF (congestive heart failure)   . Arteriosclerosis   . Chronic kidney disease   . Liver cirrhosis   . Retinal detachment   . Lyme disease   . Glaucoma   . CAD (coronary artery disease) 12/12/2014    -s/p 2V PCI of the LAD/RCA with taxus stents -cardiologist is Dr. Carmon Sails, [place]   . Depression     Past Surgical History  Procedure Laterality Date  . Leg amputation below knee  09/09/2011, 09/11/2011  . Tubal ligation    . Lasik      Family History  Problem Relation Age of Onset  . Arthritis Mother   . Heart disease Mother   . Mental illness Mother   . Diabetes Mother   . Arthritis Maternal Grandmother   . Diabetes Maternal Grandmother   . Arthritis Father   . CVA Father   . Hypertension Father   . Sudden  death Paternal Grandfather     Social History:  reports that she has never smoked. She does not have any smokeless tobacco  history on file. She reports that she does not drink alcohol or use illicit drugs.    Review of Systems    Lipid history: On Lipitor for the last few years, taking only 10 mg   Lab Results  Component Value Date   CHOL 161 12/01/2014   HDL 34.80* 12/01/2014   LDLCALC 97 12/01/2014   TRIG 147.0 12/01/2014   CHOLHDL 5 12/01/2014           Constitutional: no recent weight gain/loss, no complaints of unusual fatigue   Eyes:  history of blurred vision since about 2005.  Most recent eye exam was 2013  ENT: no recent allergies or difficulty swallowing  Cardiovascular: no chest pain or tightness on exertion.  No leg swelling.  Hypertension: None, on medications for CHF mostly  Respiratory: no cough/shortness of breath  Gastrointestinal: no constipation, diarrhea, nausea or abdominal pain  Musculoskeletal: no muscle/joint aches   Urological:  No history of urinary tract infections or dysuria  Skin: no rash or infections  Neurological: no headaches.  Has no numbness in her hands.  Did have some symptoms of neuropathy before her amputation  Psychiatric:  Has symptoms of depression, currently not on medications  Endocrine: No unusual fatigue, cold intolerance or history of thyroid disease  GYN: She has regular menstrual cycles  LABS:  Office Visit on 12/22/2014  Component Date Value Ref Range Status  . Microalb, Ur 12/22/2014 17.8* 0.0 - 1.9 mg/dL Final  . Creatinine,U 16/03/9603 102.6   Final  . Microalb Creat Ratio 12/22/2014 17.4  0.0 - 30.0 mg/g Final  . Color, UA 12/22/2014 Yellow   Final  . Clarity, UA 12/22/2014 Clear   Final  . Glucose, UA 12/22/2014 250   Final  . Bilirubin, UA 12/22/2014 Neg   Final  . Ketones, UA 12/22/2014 Neg   Final  . Spec Grav, UA 12/22/2014 1.015   Final  . Blood, UA 12/22/2014 Neg   Final  . pH, UA 12/22/2014 6.0   Final  . Protein, UA 12/22/2014 30+   Final  . Urobilinogen, UA 12/22/2014 0.2   Final  . Nitrite, UA 12/22/2014 Neg    Final  . Leukocytes, UA 12/22/2014 Negative  Negative Final    Physical Examination:  BP 120/80 mmHg  Pulse 100  Temp(Src) 99.1 F (37.3 C) (Oral)  Resp 16  Ht 4\' 7"  (1.397 m)  SpO2 89%  LMP 11/24/2014  GENERAL:         Patient has generalized obesity.   HEENT:         Eye exam shows normal external appearance. Fundus exam shows significant laser scarring on the left, right side not clearly visible Oral exam shows normal mucosa .  NECK:   There is no lymphadenopathy Thyroid is not enlarged and no nodules felt.  Carotids are normal to palpation and no bruit heard LUNGS:         Chest is symmetrical. Lungs are clear to auscultation.Marland Kitchen   HEART:         Heart sounds:  S1 and S2 are normal. No murmurs or clicks heard., no S3 or S4.   ABDOMEN:   There is no distention present.  No distention and no mass palpable          MUSCULOSKELETAL:  she has bilateral below-knee amputations  NEUROLOGICAL:  normal.sensation in the fingertips  Biceps are 1+ bilaterally.  There is no swelling or deformity of the hands. Spine is normal to inspection.   EXTREMITIES:     There is no peripheral edema. No skin lesions present.Marland Kitchen SKIN:       No rash, does appear to have some acanthosis of the neck    ASSESSMENT:  Diabetes type 2, uncontrolled    She appears to have had persistently poor control with A1c usually over 10% and around 9% recently She has long-standing diabetes and is insulin-dependent but also appears to be insulin resistant because of her requiring over 100 units of insulin a day without adequate control Unable to review her home blood sugars because she did not bring any record or her monitor See history of present illness for detailed discussion of his current management, blood sugar patterns and problems identified She is currently taking relatively small amounts of Humalog compared to her basal insulin and also is taking this inappropriately after meals instead of before.  She does  appear to be getting inadequate amount of insulin with her level of hypoglycemia Also not able to lose weight especially with her inactivity Limited choices as far as additional drugs to help her hyperglycemia with her renal dysfunction  Start TRULICITYwith the pen as shown once weekly on the same day of the week.  You may inject in the stomach, thigh or arm as indicated in the brochure given.  You will feel fullness of the stomach with starting the medication and should try to keep the portions at meals small.  You may experience nausea in the first few days which usually gets better over time   If any questions or concerns are present call the office or the  [facility] Answers Center at [phone].  Also visit [email or web address] website for more useful information  Complications: Diabetic nephropathy, neuropathy, retinopathy, macrovascular disease  Diabetic nephropathy: Unclear what level of proteinuria she has had, currently on ACE inhibitor with lisinopril  MULTIPLE comorbidities including coronary artery disease, congestive heart failure ?  Cirrhosis, visual loss, chronic kidney disease and history of peripheral vascular disease  PLAN:   Start increasing insulin to control hyperglycemia.  Since she is having relatively higher blood sugar is later in the day compared to fasting will increase her morning Lantus by 15 units.  Also increase her mealtime doses by at least 10 units  Emphasized the need to take Humalog only right before or after a meal instead of 1-2 hours postprandially  She will start Trulicity as discussed above and this should help improve her diabetes control and help with weight loss as well as control her portions and snacks.  She will start 0.75 and consider increasing the dose to 1.5 on her next visit.  Also discussed that if she needs to use an alternative product on her insurance may consider Victoza and discussed how this would be dosed  She will need to bring her  glucose monitor which will be reviewed for her blood sugar patterns and insulin doses adjusted accordingly  History of hyperlipidemia with coronary artery disease: She needs to be on at least a moderate dose statin, recent LDL is relatively high at 97.  Will defer to PCP or cardiologist  Check urine microalbumin  Patient Instructions  Take HUMALOG right at mealtimes: 25 units before meals and may go up if sugar at the next test time is consistently over 200  Lantus 55 units in am and 40 in pm  Start TRULICITY with the pen as shown once weekly on the same day of the week.  You may inject in the stomach, thigh or arm as indicated in the brochure given.  You will feel fullness of the stomach with starting the medication and should try to keep the portions at meals small.  You may experience nausea in the first few days which usually gets better over time   If any questions or concerns are present call the office or the  [facility] Center at [phone].  Also visit [email or web address] website for more useful information     Counseling time on subjects discussed above is over 50% of today's 60 minute visit   Sue Martin 12/22/2014, 8:38 PM   Note: This office note was prepared with Dragon voice recognition system technology. Any transcriptional errors that result from this process are unintentional.

## 2014-12-22 NOTE — Patient Instructions (Addendum)
Take HUMALOG right at mealtimes: 25 units before meals and may go up if sugar at the next test time is consistently over 200  Lantus 55 units in am and 40 in pm  Start TRULICITY with the pen as shown once weekly on the same day of the week.  You may inject in the stomach, thigh or arm as indicated in the brochure given.  You will feel fullness of the stomach with starting the medication and should try to keep the portions at meals small.  You may experience nausea in the first few days which usually gets better over time   If any questions or concerns are present call the office or the  [facility] at [phone].  Also visit [email or web address] website for more useful information

## 2014-12-25 ENCOUNTER — Other Ambulatory Visit: Payer: Self-pay

## 2014-12-25 ENCOUNTER — Telehealth: Payer: Self-pay | Admitting: Endocrinology

## 2014-12-25 MED ORDER — INSULIN LISPRO 100 UNIT/ML ~~LOC~~ SOLN: SUBCUTANEOUS | Status: DC

## 2014-12-25 NOTE — Telephone Encounter (Signed)
Pt needs refill calling into [facility] on [address] for the humalog 25 u 3 times daily 30 day supply

## 2015-01-01 ENCOUNTER — Other Ambulatory Visit: Payer: Self-pay | Admitting: *Deleted

## 2015-01-01 MED ORDER — INSULIN LISPRO 100 UNIT/ML (KWIKPEN): PEN_INJECTOR | SUBCUTANEOUS | Status: DC

## 2015-01-05 ENCOUNTER — Ambulatory Visit: Payer: Medicare Other | Attending: Family Medicine

## 2015-01-05 DIAGNOSIS — M25659 Stiffness of unspecified hip, not elsewhere classified: Secondary | ICD-10-CM

## 2015-01-05 DIAGNOSIS — R29898 Other symptoms and signs involving the musculoskeletal system: Secondary | ICD-10-CM | POA: Diagnosis not present

## 2015-01-05 DIAGNOSIS — M24669 Ankylosis, unspecified knee: Secondary | ICD-10-CM | POA: Diagnosis not present

## 2015-01-05 DIAGNOSIS — M25669 Stiffness of unspecified knee, not elsewhere classified: Secondary | ICD-10-CM

## 2015-01-05 NOTE — Therapy (Signed)
[facility] [address], [address] [place], [place], [address] Phone: [phone]   Fax:  [phone]  Physical Therapy Evaluation  Patient Details  Name: Sue Martin MRN: [id number] Date of Birth: 12-Oct-1965 Referring Provider:  Terressa Koyanagi, DO  Encounter Date: 01/05/2015      PT End of Session - 01/05/15 1418    Visit Number 1   Number of Visits 10  Medicare   Date for PT Re-Evaluation 03/02/15   PT Start Time 1104   PT Stop Time 1209   PT Time Calculation (min) 65 min   Activity Tolerance Patient tolerated treatment well   Behavior During Therapy [facility] for tasks assessed/performed      Past Medical History  Diagnosis Date  . Diabetes mellitus with renal complications     PVD and retinopathy  . CHF (congestive heart failure)   . Arteriosclerosis   . Chronic kidney disease   . Liver cirrhosis   . Retinal detachment   . Lyme disease   . Glaucoma   . CAD (coronary artery disease) 12/12/2014    -s/p 2V PCI of the LAD/RCA with taxus stents -cardiologist is Dr. Carmon Sails, [place]eledonio Savage   . Depression     Past Surgical History  Procedure Laterality Date  . Leg amputation below knee  09/09/2011, 09/11/2011  . Tubal ligation    . Lasik      There were no vitals filed for this visit.  Visit Diagnosis:  Stiffness of hip joint, unspecified laterality - Plan: PT plan of care cert/re-cert  Weakness of both lower extremities - Plan: PT plan of care cert/re-cert  Decreased range of motion (ROM) of knee - Plan: PT plan of care cert/re-cert      Subjective Assessment - 01/05/15 1121    Subjective Has noticed increased knee pain/back pain from sitting all day long. Has tried stretching and that seems to help.    Pertinent History bilateral below knee ampuations   How long can you sit comfortably? 2 - 5 hours    How long can you stand comfortably? N/A   How long can you walk comfortably? N/A    Patient Stated Goals To gain  flexibility in order to decrease pain, would like to go back to prosthetics    Currently in Pain? Yes  Low back pain is constant 6/10    Pain Score 6    Pain Location Knee   Pain Orientation Right;Left   Pain Descriptors / Indicators Sharp   Pain Type Chronic pain   Pain Frequency Constant   Aggravating Factors  Sitting for long periods of time    Pain Relieving Factors elevating legs, flexeril    Multiple Pain Sites Yes            [facility] PT Assessment - 01/05/15 0001    Assessment   Medical Diagnosis Pain in joint, lower leg, unspecified laterality (M25.569)   Onset Date/Surgical Date 01/04/14   Next MD Visit August 29th    Prior Therapy Yes - right after amputation surgery    Precautions   Precautions Other (comment)  Difficulty seeing   Restrictions   Weight Bearing Restrictions Yes  NWB bilaterally    Balance Screen   Has the patient fallen in the past 6 months No   Has the patient had a decrease in activity level because of a fear of falling?  No   Is the patient reluctant to leave their home because of a fear of falling?  No   Home Environment   Living Environment Other (Comment)  Long stay hotel    Living Arrangements Spouse/significant other   Type of Home Other(Comment)   Prior Function   Level of Independence Requires assistive device for independence;Needs assistance with ADLs   Vocation Unemployed   Leisure Watch TV, play online    Cognition   Overall Cognitive Status Within Functional Limits for tasks assessed   Observation/Other Assessments   Focus on Therapeutic Outcomes (FOTO)  66% limitation   ROM / Strength   AROM / PROM / Strength PROM;AROM;Strength   AROM   Overall AROM  Deficits   Overall AROM Comments Rt. knee 50% of left knee; both are limited by 75% from full extension    PROM   Overall PROM  Deficits   Overall PROM Comments Empty end feel due to pain; extension limited 75% on Rt., 50 % on Lt. Bilateral hip flexibility limited by 25-50%  with hip flexon contracture bilaterally.   Strength   Overall Strength Deficits   Strength Assessment Site Hip;Knee   Right/Left Hip Right;Left   Right Hip Flexion 3/5   Left Hip Flexion 3/5  Limited strength due to pain    Right/Left Knee Right;Left   Right Knee Extension 3-/5  Unable to perform full knee ROM    Left Knee Extension 2+/5  Limited by pain    Flexibility   Soft Tissue Assessment /Muscle Length yes   Hamstrings SLR: Rt. limited by 50% to the Lt.; increased in low back pain with Rt.                            PT Education - 01/05/15 1417    Education provided Yes   Education Details HEP: stretching for hip and knee   Person(s) Educated Patient;Spouse   Methods Explanation;Demonstration;Handout   Comprehension Verbalized understanding;Returned demonstration          PT Short Term Goals - 01/05/15 1219    PT SHORT TERM GOAL #1   Title Be independent with home exercise program    Time 4   Period Weeks   Status New   PT SHORT TERM GOAL #2   Title Decrease bil. knee pain by 25% with wheelchair <> bed transfers due to improved knee flexiblity    Time 4   Period Weeks   Status New           PT Long Term Goals - 01/05/15 1224    PT LONG TERM GOAL #1   Title Be independent with advanced HEP.    Time 8   Period Weeks   Status New   PT LONG TERM GOAL #2   Title Improve hip flexion and knee extension strength to 4-/5 bilaterally to improve ease with mobility and transfers   Time 8   Period Weeks   Status New   PT LONG TERM GOAL #3   Title decrease bilateral knee pain by 40% with transfers due to improved flexiblity   Time 8   Period Weeks   Status New   PT LONG TERM GOAL #4   Title Caregiver verbalizes understanding of how to assist with LE stretching techniques at home    Time 8   Period Weeks   Status New   PT LONG TERM GOAL #5   Title Reduce FOTO limitation to < or = to 47%    Time 8   Period Weeks   Status New  Plan - 01/05/15 1214    Clinical Impression Statement 49 y.o patient with bilateral amputation/NWB status experiencing increased bilateral knee pain due to sitting in chair for 8 hours a day. Pain began last year and has been increasing; pt reports stretching and medication sometimes helps. Knee and hip ROM limited bilaterally and painful due to hamstring tightness; patient is unable to fully extend at  knee joint. LE weakness present bilaterally; bil. knee strength limited by flexibility and pain. Will benefit from skilled PT for LE strengthening and flexiblity training.     Pt will benefit from skilled therapeutic intervention in order to improve on the following deficits Decreased range of motion;Obesity;Impaired vision/preception;Pain;Impaired flexibility;Decreased strength   Rehab Potential Good   PT Frequency 2x / week   PT Duration 8 weeks   PT Treatment/Interventions ADLs/Self Care Home Management;Therapeutic activities;Therapeutic exercise;Prosthetic Training;Cryotherapy;Electrical Stimulation;Neuromuscular re-education;Patient/family education;Passive range of motion;Compression bandaging;Energy conservation   PT Next Visit Plan Begin with hip SLR, review stretches, PROM, take PROM/AROM measurements at knee joint    Consulted and Agree with Plan of Care Patient          G-Codes - 01/05/15 1045    Functional Assessment Tool Used FOTO: 66% limitation   Functional Limitation Mobility: Walking and moving around   Mobility: Walking and Moving Around Current Status [phone]([id number]) At least 60 percent but less than 80 percent impaired, limited or restricted   Mobility: Walking and Moving Around Goal Status [phone](G8979) At least 40 percent but less than 60 percent impaired, limited or restricted       Problem List Patient Active Problem List   Diagnosis Date Noted  . Type II diabetes mellitus with ophthalmic manifestations, uncontrolled 12/22/2014  . Hyperlipidemia 12/22/2014   . CAD (coronary artery disease) 12/12/2014  . Diabetic retinopathy 12/12/2014  . PVD (peripheral vascular disease) 12/12/2014  . CKD (chronic kidney disease) stage 3, GFR 30-59 ml/min 12/12/2014  . Type 2 diabetes mellitus with diabetic nephropathy 11/28/2014  . Chronic systolic congestive heart failure 11/28/2014  . Cirrhosis 11/28/2014  . Generalized OA 11/28/2014  . S/P bilateral BKA (below knee amputation) 11/28/2014   Reyes IvanMarian Magie Ciampa, SPT 01/05/2015 2:34 PM   During this treatment session, the therapist was present, participating in, and directing the treatment. TAKACS,KELLY 01/05/2015, 2:33 PM  [facility] [address], [address] [place], [place], [address] Phone: [phone]   Fax:  [phone]

## 2015-01-05 NOTE — Patient Instructions (Addendum)
Hip Flexor Stretch   Lying on back near edge of bed, bend one leg.Sherri Rad. Hang other leg over edge, relaxed, thigh resting on a raised leg. Repeat _3___ times. Do __3x__ sessions per day. Hold for 1 minute at a time.  http://gt2.exer.us/347   Copyright  VHI. All rights reserved.  EXTENSION: Sitting - Exercise Ball (Isometric)   Sit, both legs straight on the bed.  Hold 1 minute each leg. Do 3 times each leg.  Perform _2x__ sessions per day.  Copyright  VHI. All rights reserved.  Extension: Stretch - Posterior Knee (Supine / Sitting)   Position Patient: Lie or sit with left ankle and foot supported. Hold for 5 minutes. Repeat _3x__ times. Repeat with other leg. Do _3x__ sessions per day.   Copyright  VHI. All rights reserved.  [facility] [address], [address] [place], [place] [address] Phone # [phone] Fax [phone]

## 2015-01-06 ENCOUNTER — Telehealth: Payer: Self-pay | Admitting: Family Medicine

## 2015-01-06 NOTE — Telephone Encounter (Signed)
Pt will drop off handicapp renewal form for completion. Pt is aware md out of office and we are not sure if she will charge to complete the form.

## 2015-01-12 ENCOUNTER — Other Ambulatory Visit: Payer: Self-pay | Admitting: Endocrinology

## 2015-01-13 NOTE — Telephone Encounter (Signed)
ok 

## 2015-01-19 ENCOUNTER — Telehealth: Payer: Self-pay | Admitting: Endocrinology

## 2015-01-19 ENCOUNTER — Telehealth: Payer: Self-pay | Admitting: Family Medicine

## 2015-01-19 NOTE — Telephone Encounter (Signed)
Message sent to Dr. [name] for review.

## 2015-01-19 NOTE — Telephone Encounter (Signed)
Please confirm that the patient is complaining about vomiting.  May stop Trulicity She will need to be switched to [place], will discuss this on her next visit  Needs to call PCP for her urinary symptoms

## 2015-01-19 NOTE — Telephone Encounter (Signed)
Pt has not been able to keep anything down and has a UTI this has been going on for a week and feels it could be from trulicity

## 2015-01-19 NOTE — Telephone Encounter (Signed)
Recommend appt. If thinks UTI recommend not waiting on this - could work in tomorrow 2.15?

## 2015-01-19 NOTE — Telephone Encounter (Signed)
Spoke to patient and patient states only date she can come into office is Friday, the 29th. Appointment made for Friday at 10:45. Advised patient we disapprove on waiting until Friday to be seen but patient was firm on she was unable to get to office until then. Also educated about speaking with MD Kumar's office about possible prescription since she was in communication with them recently. Patient verbalized understanding of worsening symptoms to go to ED or Urgent Care.

## 2015-01-19 NOTE — Telephone Encounter (Signed)
Pt needs abx for uti and nausea. Pt unable to come in for an appointment. Pt has been having for about 8 day. This call was transfer to [employer] [facility]

## 2015-01-19 NOTE — Telephone Encounter (Signed)
[facility] - Client TELEPHONE ADVICE RECORD   [facility] Call Center    --------------------------------------------------------------------------------   Patient Name: NANAMI WHITELAW  Gender: Female  DOB: 05-09-1966   Age: 49 Y 11 M 28 D  Return Phone Number: [phone] (Primary)  Address:     City/State/Zip:  [place]     Client [facility] - Client  Client Site [facility] - Day  Physician Kriste Basque   Contact Type Call  Call Type Triage / Clinical  Relationship To Patient Self  Return Phone Number [phone] (Primary)  Chief Complaint Medication reaction  Initial Comment Caller states she has major nausea and a UTI. She has the nausea from the medication Trulicity.   PreDisposition Call Doctor       Nurse Assessment  Nurse: Logan Bores, RN, Efraim Kaufmann Date/Time Lamount Cohen Time): 01/19/2015 3:16:52 PM  Confirm and document reason for call. If symptomatic, describe symptoms. ---Caller states she has major nausea and a UTI. She has the nausea from the medication Trulicity.    Has the patient traveled out of the country within the last 30 days? ---Not Applicable    Does the patient require triage? ---Yes    Related visit to physician within the last 2 weeks? ---Yes    Does the PT have any chronic conditions? (i.e. diabetes, asthma, etc.) ---Yes    List chronic conditions. ---Trulicity- stopped today per the endocrinologist Dr. 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 ---No           Guidelines          Guideline Title Affirmed Question Affirmed Notes Nurse Date/Time (Eastern Time)  Urination Pain - Female [1] Unable to urinate (or only a few drops) > 4 hours AND [2] bladder feels very full (e.g., palpable bladder or strong urge to urinate)     Logan Bores, RN, Efraim Kaufmann 01/19/2015 3:22:44 PM    Disp. Time Lamount Cohen Time) Disposition Final User         01/19/2015 3:26:40 PM Go to ED Now Yes Logan Bores, RN, Efraim Kaufmann            Caller Understands: Yes  Disagree/Comply: Disagree  Disagree/Comply Reason: Unable to find transportation    Care Advice Given Per Guideline        GO TO ED NOW: You need to be seen in the Emergency Department. Go to the ER at ___________ Hospital. Leave now. Drive carefully.    After Care Instructions Given        Call Event Type User Date / Time Description        --------------------------------------------------------------------------------         Comments  User: Ardeen Garland, RN Date/Time (Eastern Time): 01/19/2015 3:28:12 PM  Caller reports she has no way to get to the ED. Caller informed will make a note on the chart and contact the office, someone will be contacting her. Caller verb. understood. Caller also requesting something for nausea.    Referrals  GO TO FACILITY REFUSED

## 2015-01-19 NOTE — Telephone Encounter (Signed)
Please see below and advise.

## 2015-01-19 NOTE — Telephone Encounter (Signed)
Noted, patient is aware and stopped the trulicity yesterday, she said she has not been able to keep anything down for the last 8 days. Instructed her to call her PCP about uti, she asked about an anti-nausea medicine, I told her to ask her pcp and if she wouldn't to call us back.

## 2015-01-21 ENCOUNTER — Ambulatory Visit: Payer: PRIVATE HEALTH INSURANCE | Admitting: Nutrition

## 2015-01-21 ENCOUNTER — Ambulatory Visit: Payer: PRIVATE HEALTH INSURANCE | Admitting: Endocrinology

## 2015-01-23 ENCOUNTER — Encounter: Payer: Self-pay | Admitting: Family Medicine

## 2015-01-23 ENCOUNTER — Ambulatory Visit (INDEPENDENT_AMBULATORY_CARE_PROVIDER_SITE_OTHER): Payer: Medicare Other | Admitting: Family Medicine

## 2015-01-23 VITALS — BP 122/90 | HR 101

## 2015-01-23 DIAGNOSIS — R300 Dysuria: Secondary | ICD-10-CM

## 2015-01-23 DIAGNOSIS — I25810 Atherosclerosis of coronary artery bypass graft(s) without angina pectoris: Secondary | ICD-10-CM | POA: Diagnosis not present

## 2015-01-23 DIAGNOSIS — R110 Nausea: Secondary | ICD-10-CM | POA: Diagnosis not present

## 2015-01-23 DIAGNOSIS — N390 Urinary tract infection, site not specified: Secondary | ICD-10-CM | POA: Diagnosis not present

## 2015-01-23 LAB — POCT URINALYSIS DIPSTICK
Bilirubin, UA: NEGATIVE
Glucose, UA: NEGATIVE
KETONES UA: NEGATIVE
Nitrite, UA: POSITIVE
PH UA: 6
PROTEIN UA: NEGATIVE
Spec Grav, UA: 1.01
UROBILINOGEN UA: 0.2

## 2015-01-23 MED ORDER — NITROFURANTOIN MONOHYD MACRO 100 MG PO CAPS: 100.0000 mg | ORAL_CAPSULE | Freq: Two times a day (BID) | ORAL | Status: DC

## 2015-01-23 MED ORDER — ONDANSETRON HCL 4 MG PO TABS: 4.0000 mg | ORAL_TABLET | Freq: Three times a day (TID) | ORAL | Status: DC | PRN

## 2015-01-23 NOTE — Progress Notes (Signed)
Pre visit review using our clinic review tool, if applicable. No additional management support is needed unless otherwise documented below in the visit note. 

## 2015-01-23 NOTE — Patient Instructions (Signed)
Take the antibiotic as instructed  Follow up as scheduled and as needed

## 2015-01-23 NOTE — Progress Notes (Signed)
HPI:  Acute visit for:  Dysuira: -started: about 1 week ago -symptoms: dysuria, frequency, urgency -denies: fevers, hematuria, flank pain, vaginal symptoms -trulicity caused her severe nausea and vomiting and she is working with endocrine on this and feels better now that she stopped this - but does still have some nausea, but has had nausea her whole life, report is worse whenever she is sick and wants something for this while on the antibiotic -allergic to bactrim and penicillin  ROS: See pertinent positives and negatives per HPI.  Past Medical History  Diagnosis Date  . Diabetes mellitus with renal complications     PVD and retinopathy  . CHF (congestive heart failure)   . Arteriosclerosis   . Chronic kidney disease   . Liver cirrhosis   . Retinal detachment   . Lyme disease   . Glaucoma   . CAD (coronary artery disease) 12/12/2014    -s/p 2V PCI of the LAD/RCA with taxus stents -cardiologist is Dr. Carmon Sails, Celedonio Savage   . Depression     Past Surgical History  Procedure Laterality Date  . Leg amputation below knee  09/09/2011, 09/11/2011  . Tubal ligation    . Lasik      Family History  Problem Relation Age of Onset  . Arthritis Mother   . Heart disease Mother   . Mental illness Mother   . Diabetes Mother   . Arthritis Maternal Grandmother   . Diabetes Maternal Grandmother   . Arthritis Father   . CVA Father   . Hypertension Father   . Sudden death Paternal Grandfather     History   Social History  . Marital Status: Married    Spouse Name: N/A  . Number of Children: N/A  . Years of Education: N/A   Social History Main Topics  . Smoking status: Never Smoker   . Smokeless tobacco: Not on file  . Alcohol Use: No  . Drug Use: No  . Sexual Activity: Not on file   Other Topics Concern  . None   Social History Narrative   Married   Husband is a Naval architect        Current outpatient prescriptions:  .  aspirin 325 MG tablet, Take 325 mg by mouth.,  Disp: , Rfl:  .  atorvastatin (LIPITOR) 10 MG tablet, Take 10 mg by mouth daily., Disp: , Rfl:  .  clopidogrel (PLAVIX) 75 MG tablet, Take 75 mg by mouth daily. , Disp: , Rfl:  .  cyclobenzaprine (FLEXERIL) 10 MG tablet, Take 10 mg by mouth as needed. , Disp: , Rfl:  .  diphenhydrAMINE (BENADRYL) 50 MG capsule, Take 50 mg by mouth daily. , Disp: , Rfl:  .  glucose blood test strip, Check sugars four times daily   Dx E11.9, Disp: , Rfl:  .  ibuprofen (ADVIL,MOTRIN) 200 MG tablet, Take 200 mg by mouth as needed., Disp: , Rfl:  .  Insulin Glargine (LANTUS SOLOSTAR) 100 UNIT/ML Solostar Pen, Inject 100 mLs into the skin 2 (two) times daily., Disp: , Rfl:  .  insulin glargine (LANTUS) 100 UNIT/ML injection, Inject 40 Units into the skin 2 (two) times daily., Disp: , Rfl:  .  insulin lispro (HUMALOG) 100 UNIT/ML KiwkPen, Inject 25 units three times a day with meals, Disp: 30 mL, Rfl: 3 .  lisinopril (PRINIVIL,ZESTRIL) 10 MG tablet, Take 10 mg by mouth., Disp: , Rfl:  .  metolazone (ZAROXOLYN) 5 MG tablet, Take by mouth daily. 30 minutes before Demadex dose,  Disp: , Rfl:  .  spironolactone (ALDACTONE) 25 MG tablet, Take 25 mg by mouth daily., Disp: , Rfl:  .  torsemide (DEMADEX) 20 MG tablet, TAKE ONE TABLET BY MOUTH TWICE DAILY, Disp: , Rfl:  .  TRULICITY 0.75 MG/0.5ML SOPN, INJECT ONE UNIT DOSE (0.75 MG/0.5ML) SUBCUTANEOUSLY ONCE A WEEK, Disp: 4 pen, Rfl: 1 .  nitrofurantoin, macrocrystal-monohydrate, (MACROBID) 100 MG capsule, Take 1 capsule (100 mg total) by mouth 2 (two) times daily., Disp: 14 capsule, Rfl: 0 .  ondansetron (ZOFRAN) 4 MG tablet, Take 1 tablet (4 mg total) by mouth every 8 (eight) hours as needed for nausea or vomiting., Disp: 20 tablet, Rfl: 0  EXAM:  Filed Vitals:   01/23/15 1031  BP: 122/90  Pulse: 101    There is no weight on file to calculate BMI.  GENERAL: vitals reviewed and listed above, alert, oriented, appears well hydrated and in no acute distress  HEENT:  atraumatic, conjunttiva clear, no obvious abnormalities on inspection of external nose and ears  NECK: no obvious masses on inspection  LUNGS: clear to auscultation bilaterally, no wheezes, rales or rhonchi, good air movement  CV: HRRR, no peripheral edema  MS: moves all extremities without noticeable abnormality  PSYCH: pleasant and cooperative, no obvious depression or anxiety  ASSESSMENT AND PLAN:  Discussed the following assessment and plan:  Dysuria - Plan: POC Urinalysis Dipstick, nitrofurantoin, macrocrystal-monohydrate, (MACROBID) 100 MG capsule, ondansetron (ZOFRAN) 4 MG tablet  UTI (lower urinary tract infection) - Plan: nitrofurantoin, macrocrystal-monohydrate, (MACROBID) 100 MG capsule, ondansetron (ZOFRAN) 4 MG tablet  Nausea without vomiting - Plan: ondansetron (ZOFRAN) 4 MG tablet  -udip c/w UTI, start abx, culture pending -zofran short course while on abx only -return and emergency precautions -Patient advised to return or notify a doctor immediately if symptoms worsen or persist or new concerns arise.  There are no Patient Instructions on file for this visit.   Kriste Basque R.

## 2015-01-23 NOTE — Addendum Note (Signed)
Addended by: Johnella Moloney on: 01/23/2015 11:44 AM   Modules accepted: Orders

## 2015-01-26 LAB — URINE CULTURE: Colony Count: >=100000

## 2015-01-30 NOTE — Telephone Encounter (Signed)
Form already completed.

## 2015-02-09 ENCOUNTER — Encounter: Payer: PRIVATE HEALTH INSURANCE | Admitting: Physical Therapy

## 2015-02-16 ENCOUNTER — Ambulatory Visit: Payer: PRIVATE HEALTH INSURANCE | Admitting: Endocrinology

## 2015-02-16 ENCOUNTER — Ambulatory Visit: Payer: Medicare Other | Attending: Family Medicine | Admitting: Physical Therapy

## 2015-02-16 ENCOUNTER — Encounter: Payer: Self-pay | Admitting: Physical Therapy

## 2015-02-16 DIAGNOSIS — M24669 Ankylosis, unspecified knee: Secondary | ICD-10-CM | POA: Insufficient documentation

## 2015-02-16 DIAGNOSIS — R29898 Other symptoms and signs involving the musculoskeletal system: Secondary | ICD-10-CM | POA: Diagnosis not present

## 2015-02-16 DIAGNOSIS — M25669 Stiffness of unspecified knee, not elsewhere classified: Secondary | ICD-10-CM

## 2015-02-16 DIAGNOSIS — M25659 Stiffness of unspecified hip, not elsewhere classified: Secondary | ICD-10-CM

## 2015-02-16 NOTE — Therapy (Signed)
[facility] [address], [address] [place], [place], [address] Phone: [phone]   Fax:  [phone]  Physical Therapy Treatment  Patient Details  Name: Sue Martin MRN: [id number] Date of Birth: 05-25-1966 Referring Provider:  Terressa Koyanagi, DO  Encounter Date: 02/16/2015      PT End of Session - 02/16/15 1054    Visit Number 2   Number of Visits 10   Date for PT Re-Evaluation 03/02/15   PT Start Time 1018   PT Stop Time 1100   PT Time Calculation (min) 42 min   Activity Tolerance Patient tolerated treatment well   Behavior During Therapy [facility] for tasks assessed/performed      Past Medical History  Diagnosis Date  . Diabetes mellitus with renal complications     PVD and retinopathy  . CHF (congestive heart failure)   . Arteriosclerosis   . Chronic kidney disease   . Liver cirrhosis   . Retinal detachment   . Lyme disease   . Glaucoma   . CAD (coronary artery disease) 12/12/2014    -s/p 2V PCI of the LAD/RCA with taxus stents -cardiologist is Dr. Carmon Sails, Celedonio Martin   . Depression     Past Surgical History  Procedure Laterality Date  . Leg amputation below knee  09/09/2011, 09/11/2011  . Tubal ligation    . Lasik      There were no vitals filed for this visit.  Visit Diagnosis:  Weakness of both lower extremities  Decreased range of motion (ROM) of knee  Stiffness of hip joint, unspecified laterality      Subjective Assessment - 02/16/15 1023    Subjective Rain yesterday made knees, hips, and back sore.    Currently in Pain? Yes   Pain Score 8    Pain Location --  Joints of knees, hips, back   Pain Orientation Right;Left   Pain Descriptors / Indicators Aching   Aggravating Factors  Sitting   Pain Relieving Factors Laying down and meds   Multiple Pain Sites No                         [facility] Adult PT Treatment/Exercise - 02/16/15 0001    Lumbar Exercises: Stretches   Single Knee to  Chest Stretch 3 reps;20 seconds   Single Knee to Chest Stretch Limitations Needsa assistance with towel to get into position   Lumbar Exercises: Supine   Other Supine Lumbar Exercises decompression breathing    Other Supine Lumbar Exercises Hip IR stretch 3 x20 sec    Knee/Hip Exercises: Supine   Short Arc Quad Sets AROM;Both;2 sets;5 sets   Short Arc Quad Sets Limitations RT side was harder than Lt   Straight Leg Raises --  SAQ to SLR bil 8x    Knee/Hip Exercises: Sidelying   Hip ABduction --  TA prior to lift 8x bil                  PT Short Term Goals - 01/05/15 1219    PT SHORT TERM GOAL #1   Title Be independent with home exercise program    Time 4   Period Weeks   Status New   PT SHORT TERM GOAL #2   Title Decrease bil. knee pain by 25% with wheelchair <> bed transfers due to improved knee flexiblity    Time 4   Period Weeks   Status New  PT Long Term Goals - 01/05/15 1224    PT LONG TERM GOAL #1   Title Be independent with advanced HEP.    Time 8   Period Weeks   Status New   PT LONG TERM GOAL #2   Title Improve hip flexion and knee extension strength to 4-/5 bilaterally to improve ease with mobility and transfers   Time 8   Period Weeks   Status New   PT LONG TERM GOAL #3   Title decrease bilateral knee pain by 40% with transfers due to improved flexiblity   Time 8   Period Weeks   Status New   PT LONG TERM GOAL #4   Title Caregiver verbalizes understanding of how to assist with LE stretching techniques at home    Time 8   Period Weeks   Status New   PT LONG TERM GOAL #5   Title Reduce FOTO limitation to < or = to 47%    Time 8   Period Weeks   Status New               Plan - 02/16/15 1059    Clinical Impression Statement Pt had some breathing difficulties today from CHF so we moved slow and kept volume low. Technically pt performed exercises very well, RT side was more difficult than the LT. Exercises did not increase  any of her pain in any joint.     Pt will benefit from skilled therapeutic intervention in order to improve on the following deficits Decreased range of motion;Obesity;Impaired vision/preception;Pain;Impaired flexibility;Decreased strength   Rehab Potential Good   PT Frequency 2x / week   PT Duration 8 weeks   PT Treatment/Interventions ADLs/Self Care Home Management;Therapeutic activities;Therapeutic exercise;Prosthetic Training;Cryotherapy;Electrical Stimulation;Neuromuscular re-education;Patient/family education;Passive range of motion;Compression bandaging;Energy conservation   PT Next Visit Plan Give SLR for HEP if ok, take knee measurements, PROM knee extension, hip extension   Consulted and Agree with Plan of Care Patient        Problem List Patient Active Problem List   Diagnosis Date Noted  . Type II diabetes mellitus with ophthalmic manifestations, uncontrolled 12/22/2014  . Hyperlipidemia 12/22/2014  . CAD (coronary artery disease) 12/12/2014  . Diabetic retinopathy 12/12/2014  . PVD (peripheral vascular disease) 12/12/2014  . CKD (chronic kidney disease) stage 3, GFR 30-59 ml/min 12/12/2014  . Type 2 diabetes mellitus with diabetic nephropathy 11/28/2014  . Chronic systolic congestive heart failure 11/28/2014  . Cirrhosis 11/28/2014  . Generalized OA 11/28/2014  . S/P bilateral BKA (below knee amputation) 11/28/2014    [name], PTA 02/16/2015, 11:04 AM  [facility] [address] [place], [place], [address] Phone: [phone]   Fax:  [phone]

## 2015-02-19 ENCOUNTER — Ambulatory Visit: Payer: Medicare Other

## 2015-02-19 DIAGNOSIS — M24669 Ankylosis, unspecified knee: Secondary | ICD-10-CM | POA: Diagnosis not present

## 2015-02-19 DIAGNOSIS — M25659 Stiffness of unspecified hip, not elsewhere classified: Secondary | ICD-10-CM

## 2015-02-19 DIAGNOSIS — R29898 Other symptoms and signs involving the musculoskeletal system: Secondary | ICD-10-CM

## 2015-02-19 DIAGNOSIS — M25669 Stiffness of unspecified knee, not elsewhere classified: Secondary | ICD-10-CM

## 2015-02-19 NOTE — Therapy (Signed)
[facility] [address] [place], [place], [address] Phone: [phone]   Fax:  [phone]  Physical Therapy Treatment  Patient Details  Name: Sue Martin MRN: [id number] Date of Birth: October 08, 1965 Referring Provider:  Terressa Koyanagi, DO  Encounter Date: 02/19/2015      PT End of Session - 02/19/15 1054    Visit Number 3   Number of Visits 10   Date for PT Re-Evaluation 03/02/15   PT Start Time 1018   PT Stop Time 1056   PT Time Calculation (min) 38 min   Activity Tolerance Patient limited by pain   Behavior During Therapy [facility] for tasks assessed/performed      Past Medical History  Diagnosis Date  . Diabetes mellitus with renal complications     PVD and retinopathy  . CHF (congestive heart failure)   . Arteriosclerosis   . Chronic kidney disease   . Liver cirrhosis   . Retinal detachment   . Lyme disease   . Glaucoma   . CAD (coronary artery disease) 12/12/2014    -s/p 2V PCI of the LAD/RCA with taxus stents -cardiologist is Dr. Carmon Sails, Celedonio Savage   . Depression     Past Surgical History  Procedure Laterality Date  . Leg amputation below knee  09/09/2011, 09/11/2011  . Tubal ligation    . Lasik      There were no vitals filed for this visit.  Visit Diagnosis:  Weakness of both lower extremities  Decreased range of motion (ROM) of knee  Stiffness of hip joint, unspecified laterality      Subjective Assessment - 02/19/15 1024    Subjective Going to see MD as she is having diffuculty breathing   Pertinent History bilateral below knee ampuations   Currently in Pain? Yes   Pain Score 8    Pain Location Leg   Pain Orientation Right;Left   Pain Descriptors / Indicators Aching   Pain Type Chronic pain            [facility] PT Assessment - 02/19/15 0001    PROM   Overall PROM  Deficits   Overall PROM Comments lacking 35 degrees bilaterally                     [facility] Adult PT  Treatment/Exercise - 02/19/15 0001    Lumbar Exercises: Stretches   Single Knee to Chest Stretch 3 reps;20 seconds   Single Knee to Chest Stretch Limitations Needsa assistance with towel to get into position   Lumbar Exercises: Supine   Straight Leg Raise 20 reps   Other Supine Lumbar Exercises Hip IR stretch 3 x20 sec    Knee/Hip Exercises: Stretches   Other Knee/Hip Stretches prolonged stretch into extension.  End of legs placed on blue pad 4x1 minute hold with verbal cues to keep hips neutral.    Knee/Hip Exercises: Sidelying   Hip ABduction 2 sets;10 reps;Both  verbal cues for not flexing hip   Manual Therapy   Manual Therapy Passive ROM   Manual therapy comments PROM and prolonged hold (gentle) into knee extension                  PT Short Term Goals - 01/05/15 1219    PT SHORT TERM GOAL #1   Title Be independent with home exercise program    Time 4   Period Weeks   Status New   PT SHORT TERM GOAL #2   Title  Decrease bil. knee pain by 25% with wheelchair <> bed transfers due to improved knee flexiblity    Time 4   Period Weeks   Status New           PT Long Term Goals - 01/05/15 1224    PT LONG TERM GOAL #1   Title Be independent with advanced HEP.    Time 8   Period Weeks   Status New   PT LONG TERM GOAL #2   Title Improve hip flexion and knee extension strength to 4-/5 bilaterally to improve ease with mobility and transfers   Time 8   Period Weeks   Status New   PT LONG TERM GOAL #3   Title decrease bilateral knee pain by 40% with transfers due to improved flexiblity   Time 8   Period Weeks   Status New   PT LONG TERM GOAL #4   Title Caregiver verbalizes understanding of how to assist with LE stretching techniques at home    Time 8   Period Weeks   Status New   PT LONG TERM GOAL #5   Title Reduce FOTO limitation to < or = to 47%    Time 8   Period Weeks   Status New               Plan - 02/19/15 1027    Clinical Impression  Statement Pt with difficulty breathing today due to CHF.  Pt with stiffness of both hips and knees due to immobility.  Pt will benefit from skilled Pt as she is not able to assume position to allow for hip and knee flexibility at home independently.     Pt will benefit from skilled therapeutic intervention in order to improve on the following deficits Decreased range of motion;Obesity;Impaired vision/preception;Pain;Impaired flexibility;Decreased strength   Rehab Potential Good   PT Frequency 2x / week   PT Duration 8 weeks   PT Treatment/Interventions ADLs/Self Care Home Management;Therapeutic activities;Therapeutic exercise;Prosthetic Training;Cryotherapy;Electrical Stimulation;Neuromuscular re-education;Patient/family education;Passive range of motion;Compression bandaging;Energy conservation   PT Next Visit Plan Pt will bring her husband and son with her.  Show them how to stretch her into knee and hip extension   Consulted and Agree with Plan of Care Patient        Problem List Patient Active Problem List   Diagnosis Date Noted  . Type II diabetes mellitus with ophthalmic manifestations, uncontrolled 12/22/2014  . Hyperlipidemia 12/22/2014  . CAD (coronary artery disease) 12/12/2014  . Diabetic retinopathy 12/12/2014  . PVD (peripheral vascular disease) 12/12/2014  . CKD (chronic kidney disease) stage 3, GFR 30-59 ml/min 12/12/2014  . Type 2 diabetes mellitus with diabetic nephropathy 11/28/2014  . Chronic systolic congestive heart failure 11/28/2014  . Cirrhosis 11/28/2014  . Generalized OA 11/28/2014  . S/P bilateral BKA (below knee amputation) 11/28/2014    Kewanda Poland, PT 02/19/2015, 10:55 AM  [facility] [address] [place], [place], [address] Phone: [phone]   Fax:  [phone]

## 2015-02-20 ENCOUNTER — Emergency Department (HOSPITAL_BASED_OUTPATIENT_CLINIC_OR_DEPARTMENT_OTHER): Payer: PRIVATE HEALTH INSURANCE

## 2015-02-20 ENCOUNTER — Encounter (HOSPITAL_BASED_OUTPATIENT_CLINIC_OR_DEPARTMENT_OTHER): Payer: Self-pay | Admitting: *Deleted

## 2015-02-20 ENCOUNTER — Observation Stay (HOSPITAL_BASED_OUTPATIENT_CLINIC_OR_DEPARTMENT_OTHER)
Admission: EM | Admit: 2015-02-20 | Discharge: 2015-02-21 | Disposition: A | Discharge status: Home / Self Care | Payer: PRIVATE HEALTH INSURANCE | Attending: Internal Medicine | Admitting: Internal Medicine

## 2015-02-20 DIAGNOSIS — N184 Chronic kidney disease, stage 4 (severe): Secondary | ICD-10-CM | POA: Diagnosis not present

## 2015-02-20 DIAGNOSIS — Z89511 Acquired absence of right leg below knee: Secondary | ICD-10-CM | POA: Insufficient documentation

## 2015-02-20 DIAGNOSIS — Z7902 Long term (current) use of antithrombotics/antiplatelets: Secondary | ICD-10-CM | POA: Insufficient documentation

## 2015-02-20 DIAGNOSIS — I2510 Atherosclerotic heart disease of native coronary artery without angina pectoris: Secondary | ICD-10-CM | POA: Diagnosis not present

## 2015-02-20 DIAGNOSIS — N390 Urinary tract infection, site not specified: Secondary | ICD-10-CM

## 2015-02-20 DIAGNOSIS — F329 Major depressive disorder, single episode, unspecified: Secondary | ICD-10-CM | POA: Diagnosis not present

## 2015-02-20 DIAGNOSIS — E1122 Type 2 diabetes mellitus with diabetic chronic kidney disease: Secondary | ICD-10-CM | POA: Insufficient documentation

## 2015-02-20 DIAGNOSIS — E1151 Type 2 diabetes mellitus with diabetic peripheral angiopathy without gangrene: Secondary | ICD-10-CM | POA: Diagnosis not present

## 2015-02-20 DIAGNOSIS — Z791 Long term (current) use of non-steroidal anti-inflammatories (NSAID): Secondary | ICD-10-CM | POA: Insufficient documentation

## 2015-02-20 DIAGNOSIS — Z89512 Acquired absence of left leg below knee: Secondary | ICD-10-CM | POA: Diagnosis not present

## 2015-02-20 DIAGNOSIS — K7460 Unspecified cirrhosis of liver: Secondary | ICD-10-CM | POA: Insufficient documentation

## 2015-02-20 DIAGNOSIS — R0789 Other chest pain: Secondary | ICD-10-CM | POA: Insufficient documentation

## 2015-02-20 DIAGNOSIS — Z794 Long term (current) use of insulin: Secondary | ICD-10-CM | POA: Diagnosis not present

## 2015-02-20 DIAGNOSIS — I509 Heart failure, unspecified: Secondary | ICD-10-CM

## 2015-02-20 DIAGNOSIS — I129 Hypertensive chronic kidney disease with stage 1 through stage 4 chronic kidney disease, or unspecified chronic kidney disease: Secondary | ICD-10-CM | POA: Diagnosis not present

## 2015-02-20 DIAGNOSIS — Z7982 Long term (current) use of aspirin: Secondary | ICD-10-CM | POA: Diagnosis not present

## 2015-02-20 DIAGNOSIS — Z79899 Other long term (current) drug therapy: Secondary | ICD-10-CM | POA: Diagnosis not present

## 2015-02-20 DIAGNOSIS — E1139 Type 2 diabetes mellitus with other diabetic ophthalmic complication: Secondary | ICD-10-CM | POA: Diagnosis not present

## 2015-02-20 DIAGNOSIS — E876 Hypokalemia: Secondary | ICD-10-CM | POA: Insufficient documentation

## 2015-02-20 DIAGNOSIS — I5023 Acute on chronic systolic (congestive) heart failure: Secondary | ICD-10-CM | POA: Diagnosis not present

## 2015-02-20 DIAGNOSIS — E1165 Type 2 diabetes mellitus with hyperglycemia: Secondary | ICD-10-CM | POA: Insufficient documentation

## 2015-02-20 DIAGNOSIS — N183 Chronic kidney disease, stage 3 unspecified: Secondary | ICD-10-CM | POA: Diagnosis present

## 2015-02-20 DIAGNOSIS — Z955 Presence of coronary angioplasty implant and graft: Secondary | ICD-10-CM | POA: Diagnosis not present

## 2015-02-20 DIAGNOSIS — R0602 Shortness of breath: Secondary | ICD-10-CM | POA: Diagnosis present

## 2015-02-20 DIAGNOSIS — IMO0002 Reserved for concepts with insufficient information to code with codable children: Secondary | ICD-10-CM | POA: Diagnosis present

## 2015-02-20 LAB — URINALYSIS, ROUTINE W REFLEX MICROSCOPIC
Bilirubin Urine: NEGATIVE
GLUCOSE, UA: NEGATIVE mg/dL
Ketones, ur: NEGATIVE mg/dL
Nitrite: POSITIVE — AB
PH: 6 (ref 5.0–8.0)
Protein, ur: 30 mg/dL — AB
Specific Gravity, Urine: 1.011 (ref 1.005–1.030)
Urobilinogen, UA: 1 mg/dL (ref 0.0–1.0)

## 2015-02-20 LAB — CBC
HCT: 41 % (ref 36.0–46.0)
Hemoglobin: 13 g/dL (ref 12.0–15.0)
MCH: 29 pg (ref 26.0–34.0)
MCHC: 31.7 g/dL (ref 30.0–36.0)
MCV: 91.5 fL (ref 78.0–100.0)
PLATELETS: 160 10*3/uL (ref 150–400)
RBC: 4.48 MIL/uL (ref 3.87–5.11)
RDW: 16.1 % — AB (ref 11.5–15.5)
WBC: 8.2 10*3/uL (ref 4.0–10.5)

## 2015-02-20 LAB — GLUCOSE, CAPILLARY: GLUCOSE-CAPILLARY: 133 mg/dL — AB (ref 65–99)

## 2015-02-20 LAB — BASIC METABOLIC PANEL
ANION GAP: 9 (ref 5–15)
BUN: 17 mg/dL (ref 6–20)
CO2: 32 mmol/L (ref 22–32)
Calcium: 9.2 mg/dL (ref 8.9–10.3)
Chloride: 100 mmol/L — ABNORMAL LOW (ref 101–111)
Creatinine, Ser: 1.32 mg/dL — ABNORMAL HIGH (ref 0.44–1.00)
GFR calc Af Amer: 54 mL/min — ABNORMAL LOW (ref >60)
GFR calc non Af Amer: 47 mL/min — ABNORMAL LOW (ref >60)
GLUCOSE: 116 mg/dL — AB (ref 65–99)
POTASSIUM: 3.4 mmol/L — AB (ref 3.5–5.1)
Sodium: 141 mmol/L (ref 135–145)

## 2015-02-20 LAB — PROTIME-INR
INR: 1.12 (ref 0.00–1.49)
Prothrombin Time: 14.6 seconds (ref 11.6–15.2)

## 2015-02-20 LAB — URINE MICROSCOPIC-ADD ON

## 2015-02-20 LAB — BRAIN NATRIURETIC PEPTIDE: B Natriuretic Peptide: 164 pg/mL — ABNORMAL HIGH (ref 0.0–100.0)

## 2015-02-20 LAB — TROPONIN I

## 2015-02-20 MED ORDER — DEXTROSE 5 % IV SOLN
1.0000 g | Freq: Once | INTRAVENOUS | Status: AC
  Administered 2015-02-20: 1 g via INTRAVENOUS

## 2015-02-20 MED ORDER — FUROSEMIDE 10 MG/ML IJ SOLN
40.0000 mg | Freq: Once | INTRAMUSCULAR | Status: AC
  Administered 2015-02-20: 40 mg via INTRAVENOUS
  Filled 2015-02-20: qty 4

## 2015-02-20 MED ORDER — CEFTRIAXONE SODIUM 1 G IJ SOLR
INTRAMUSCULAR | Status: AC
Start: 2015-02-20 — End: 2015-02-20
  Filled 2015-02-20: qty 10

## 2015-02-20 MED ORDER — ALBUTEROL SULFATE (2.5 MG/3ML) 0.083% IN NEBU
5.0000 mg | INHALATION_SOLUTION | Freq: Once | RESPIRATORY_TRACT | Status: AC
  Administered 2015-02-20: 5 mg via RESPIRATORY_TRACT
  Filled 2015-02-20: qty 6

## 2015-02-20 NOTE — ED Notes (Signed)
Pt has decided that she is agreeable to being admitted.

## 2015-02-20 NOTE — ED Notes (Signed)
Sob x 2 days. States she has been taking diuretics but feels she is in CHF.

## 2015-02-20 NOTE — ED Notes (Signed)
Per Dr. Madilyn Hook, removing [place] O2 to trial pt on RA.

## 2015-02-20 NOTE — ED Notes (Signed)
Pt sat on RA fell below 90% to 88% consistently with lowest observed sat at 86%. Dr. Madilyn Hook notified and [place] O2 at 2 L reinitiated.

## 2015-02-20 NOTE — ED Provider Notes (Signed)
CSN: [id number]     Arrival date & time 02/20/15  1722 History   First MD Initiated Contact with Patient 02/20/15 1734     Chief Complaint  Patient presents with  . Shortness of Breath      Patient is a 49 y.o. female presenting with shortness of breath. The history is provided by the patient. No language interpreter was used.  Shortness of Breath  Ms. Sue Martin presents for evaluation of SOB.  She reports that she's had shortness of breath for the last 4-5 days, significantly increased over the last day to day and a half. She has a history of congestive heart failure and believe she is gained 35-40 pounds last week but does not weigh herself at home. She feels that all her clothes are small on her. She has a nonproductive cough. She has a temperature of 100-100.6 at home. She states that she's been outside a lot lately. She reports decreased urination despite taking her diuretic medications. She has chest discomfort. No vomiting or abdominal pain.  Past Medical History  Diagnosis Date  . Diabetes mellitus with renal complications     PVD and retinopathy  . CHF (congestive heart failure)   . Arteriosclerosis   . Chronic kidney disease   . Liver cirrhosis   . Retinal detachment   . Lyme disease   . Glaucoma   . CAD (coronary artery disease) 12/12/2014    -s/p 2V PCI of the LAD/RCA with taxus stents -cardiologist is Dr. Carmon Sails, Celedonio Savage   . Depression    Past Surgical History  Procedure Laterality Date  . Leg amputation below knee  09/09/2011, 09/11/2011  . Tubal ligation    . Lasik     Family History  Problem Relation Age of Onset  . Arthritis Mother   . Heart disease Mother   . Mental illness Mother   . Diabetes Mother   . Arthritis Maternal Grandmother   . Diabetes Maternal Grandmother   . Arthritis Father   . CVA Father   . Hypertension Father   . Sudden death Paternal Grandfather    Social History  Substance Use Topics  . Smoking status: Never Smoker   . Smokeless  tobacco: None  . Alcohol Use: No   OB History    No data available     Review of Systems  Respiratory: Positive for shortness of breath.   All other systems reviewed and are negative.     Allergies  Broccoli; Iron; Latex; Penicillins; Codeine; and Sulfa antibiotics  Home Medications   Prior to Admission medications   Medication Sig Start Date End Date Taking? Authorizing Provider  aspirin 325 MG tablet Take 325 mg by mouth.    Historical Provider, MD  atorvastatin (LIPITOR) 10 MG tablet Take 10 mg by mouth daily.    Historical Provider, MD  clopidogrel (PLAVIX) 75 MG tablet Take 75 mg by mouth daily.  06/25/14 06/25/15  Historical Provider, MD  cyclobenzaprine (FLEXERIL) 10 MG tablet Take 10 mg by mouth as needed.  06/25/14   Historical Provider, MD  diphenhydrAMINE (BENADRYL) 50 MG capsule Take 50 mg by mouth daily.     Historical Provider, MD  glucose blood test strip Check sugars four times daily   Dx E11.9 11/21/14 11/21/15  Historical Provider, MD  ibuprofen (ADVIL,MOTRIN) 200 MG tablet Take 200 mg by mouth as needed.    Historical Provider, MD  Insulin Glargine (LANTUS SOLOSTAR) 100 UNIT/ML Solostar Pen Inject 100 mLs into the skin 2 (two)  times daily. 12/15/14   Historical Provider, MD  insulin glargine (LANTUS) 100 UNIT/ML injection Inject 40 Units into the skin 2 (two) times daily.    Historical Provider, MD  insulin lispro (HUMALOG) 100 UNIT/ML KiwkPen Inject 25 units three times a day with meals 01/01/15   Reather Littler, MD  lisinopril (PRINIVIL,ZESTRIL) 10 MG tablet Take 10 mg by mouth. 06/25/14 06/25/15  Historical Provider, MD  metolazone (ZAROXOLYN) 5 MG tablet Take by mouth daily. 30 minutes before Demadex dose 06/25/14   Historical Provider, MD  nitrofurantoin, macrocrystal-monohydrate, (MACROBID) 100 MG capsule Take 1 capsule (100 mg total) by mouth 2 (two) times daily. 01/23/15   Terressa Koyanagi, DO  ondansetron (ZOFRAN) 4 MG tablet Take 1 tablet (4 mg total) by mouth every 8  (eight) hours as needed for nausea or vomiting. 01/23/15   Terressa Koyanagi, DO  spironolactone (ALDACTONE) 25 MG tablet Take 25 mg by mouth daily.    Historical Provider, MD  torsemide (DEMADEX) 20 MG tablet TAKE ONE TABLET BY MOUTH TWICE DAILY 06/25/14   Historical Provider, MD  TRULICITY 0.75 MG/0.5ML SOPN INJECT ONE UNIT DOSE (0.75 MG/0.5ML) SUBCUTANEOUSLY ONCE A WEEK 01/12/15   Reather Littler, MD   BP 125/66 mmHg  Pulse 95  Temp(Src) 98.8 F (37.1 C) (Oral)  Resp 24  Wt 227 lb (102.967 kg)  SpO2 92%  LMP 01/29/2015 Physical Exam  Constitutional: She is oriented to person, place, and time. She appears well-developed and well-nourished.  HENT:  Head: Normocephalic and atraumatic.  Cardiovascular: Normal rate and regular rhythm.   No murmur heard. Pulmonary/Chest:  Speaks in short sentences, decreased air movement bilaterally with occasional end expiratory wheezes in the right lung base.  Abdominal: Soft. There is no tenderness. There is no rebound and no guarding.  Musculoskeletal:  Bilateral BKA's, nonpitting edema to bilateral lower extremities.  Neurological: She is alert and oriented to person, place, and time.  Skin: Skin is warm and dry.  Psychiatric: She has a normal mood and affect. Her behavior is normal.  Nursing note and vitals reviewed.   ED Course  Procedures (including critical care time) Labs Review Labs Reviewed  BASIC METABOLIC PANEL - Abnormal; Notable for the following:    Potassium 3.4 (*)    Chloride 100 (*)    Glucose, Bld 116 (*)    Creatinine, Ser 1.32 (*)    GFR calc non Af Amer 47 (*)    GFR calc Af Amer 54 (*)    All other components within normal limits  CBC - Abnormal; Notable for the following:    RDW 16.1 (*)    All other components within normal limits  URINALYSIS, ROUTINE W REFLEX MICROSCOPIC (NOT AT [facility]) - Abnormal; Notable for the following:    APPearance CLOUDY (*)    Hgb urine dipstick TRACE (*)    Protein, ur 30 (*)    Nitrite POSITIVE  (*)    Leukocytes, UA MODERATE (*)    All other components within normal limits  BRAIN NATRIURETIC PEPTIDE - Abnormal; Notable for the following:    B Natriuretic Peptide 164.0 (*)    All other components within normal limits  URINE MICROSCOPIC-ADD ON - Abnormal; Notable for the following:    Squamous Epithelial / LPF FEW (*)    Bacteria, UA MANY (*)    All other components within normal limits  URINE CULTURE  TROPONIN I  PROTIME-INR    Imaging Review No results found. I have personally reviewed and evaluated  these images and lab results as part of my medical decision-making.   EKG Interpretation None      MDM   Final diagnoses:  Acute congestive heart failure, unspecified congestive heart failure type  Acute UTI   Recheck at 1900 - pt had transient improvement after neb, then developed recurrent chest tightness.  Repeat lung exam with good air movement bilaterally, occasional crackles.  Pt with SpO2 86% on room air.     Patient has had some diuresis with Lasix therapy but she is continuing to be short of breath with oxygen requirement on repeat evaluation. UA is consistent with UTI, culture sent and treated with Rocephin. Discussed with Dr. Julian Reil or with hospitalist service over at [facility], plan to admit for CHF exacerbation.   Tilden Fossa, MD 02/20/15 2249

## 2015-02-20 NOTE — ED Notes (Signed)
Pt verbalized to EDP and RN that she would rather not be admitted if she has an option. EDP is agreeable to observing whether the pt feels better and her hypoxia improves after administration of Lasix prior to making a decision. Pt has been informed that she will be observed for improvement until 2130 at which point a decision about her disposition will be made.

## 2015-02-21 ENCOUNTER — Encounter (HOSPITAL_COMMUNITY): Payer: Self-pay

## 2015-02-21 DIAGNOSIS — E1139 Type 2 diabetes mellitus with other diabetic ophthalmic complication: Secondary | ICD-10-CM

## 2015-02-21 DIAGNOSIS — I5023 Acute on chronic systolic (congestive) heart failure: Secondary | ICD-10-CM

## 2015-02-21 DIAGNOSIS — N183 Chronic kidney disease, stage 3 (moderate): Secondary | ICD-10-CM

## 2015-02-21 DIAGNOSIS — E1165 Type 2 diabetes mellitus with hyperglycemia: Secondary | ICD-10-CM | POA: Diagnosis not present

## 2015-02-21 LAB — BASIC METABOLIC PANEL
Anion gap: 9 (ref 5–15)
BUN: 13 mg/dL (ref 6–20)
CHLORIDE: 98 mmol/L — AB (ref 101–111)
CO2: 32 mmol/L (ref 22–32)
Calcium: 9.1 mg/dL (ref 8.9–10.3)
Creatinine, Ser: 1.31 mg/dL — ABNORMAL HIGH (ref 0.44–1.00)
GFR calc non Af Amer: 47 mL/min — ABNORMAL LOW (ref >60)
GFR, EST AFRICAN AMERICAN: 55 mL/min — AB (ref >60)
Glucose, Bld: 216 mg/dL — ABNORMAL HIGH (ref 65–99)
POTASSIUM: 3.1 mmol/L — AB (ref 3.5–5.1)
SODIUM: 139 mmol/L (ref 135–145)

## 2015-02-21 LAB — GLUCOSE, CAPILLARY
GLUCOSE-CAPILLARY: 120 mg/dL — AB (ref 65–99)
Glucose-Capillary: 192 mg/dL — ABNORMAL HIGH (ref 65–99)

## 2015-02-21 LAB — MAGNESIUM: MAGNESIUM: 1.9 mg/dL (ref 1.7–2.4)

## 2015-02-21 MED ORDER — ASPIRIN 325 MG PO TABS
325.0000 mg | ORAL_TABLET | Freq: Every day | ORAL | Status: DC
  Administered 2015-02-21: 325 mg via ORAL
  Filled 2015-02-21: qty 1

## 2015-02-21 MED ORDER — LISINOPRIL 10 MG PO TABS
10.0000 mg | ORAL_TABLET | Freq: Every day | ORAL | Status: DC
  Administered 2015-02-21: 10 mg via ORAL
  Filled 2015-02-21: qty 1

## 2015-02-21 MED ORDER — DEXTROSE 5 % IV SOLN
1.0000 g | INTRAVENOUS | Status: DC
  Filled 2015-02-21: qty 10

## 2015-02-21 MED ORDER — TORSEMIDE 20 MG PO TABS
20.0000 mg | ORAL_TABLET | Freq: Two times a day (BID) | ORAL | Status: DC
  Administered 2015-02-21: 20 mg via ORAL
  Filled 2015-02-21: qty 1

## 2015-02-21 MED ORDER — TORSEMIDE 20 MG PO TABS: 20.0000 mg | ORAL_TABLET | Freq: Two times a day (BID) | ORAL | Status: DC

## 2015-02-21 MED ORDER — SODIUM CHLORIDE 0.9 % IV SOLN: 250.0000 mL | INTRAVENOUS | Status: DC | PRN

## 2015-02-21 MED ORDER — ACETAMINOPHEN 325 MG PO TABS: 650.0000 mg | ORAL_TABLET | ORAL | Status: DC | PRN

## 2015-02-21 MED ORDER — INSULIN GLARGINE 100 UNIT/ML SOLOSTAR PEN: 40.0000 [IU] | PEN_INJECTOR | Freq: Every day | SUBCUTANEOUS | Status: DC

## 2015-02-21 MED ORDER — CLOPIDOGREL BISULFATE 75 MG PO TABS
75.0000 mg | ORAL_TABLET | Freq: Every day | ORAL | Status: DC
  Administered 2015-02-21: 75 mg via ORAL
  Filled 2015-02-21: qty 1

## 2015-02-21 MED ORDER — IBUPROFEN 200 MG PO TABS
200.0000 mg | ORAL_TABLET | Freq: Three times a day (TID) | ORAL | Status: DC | PRN
  Filled 2015-02-21: qty 1

## 2015-02-21 MED ORDER — CYCLOBENZAPRINE HCL 10 MG PO TABS: 10.0000 mg | ORAL_TABLET | Freq: Three times a day (TID) | ORAL | Status: DC | PRN

## 2015-02-21 MED ORDER — INSULIN GLARGINE 100 UNIT/ML ~~LOC~~ SOLN
40.0000 [IU] | Freq: Every day | SUBCUTANEOUS | Status: DC
  Administered 2015-02-21: 40 [IU] via SUBCUTANEOUS
  Filled 2015-02-21 (×2): qty 0.4

## 2015-02-21 MED ORDER — INSULIN LISPRO 100 UNIT/ML (KWIKPEN): 25.0000 [IU] | PEN_INJECTOR | Freq: Three times a day (TID) | SUBCUTANEOUS | Status: DC

## 2015-02-21 MED ORDER — CEFPODOXIME PROXETIL 200 MG PO TABS: 200.0000 mg | ORAL_TABLET | Freq: Two times a day (BID) | ORAL | Status: DC

## 2015-02-21 MED ORDER — INSULIN GLARGINE 100 UNIT/ML ~~LOC~~ SOLN
55.0000 [IU] | Freq: Every day | SUBCUTANEOUS | Status: DC
  Administered 2015-02-21: 55 [IU] via SUBCUTANEOUS
  Filled 2015-02-21 (×2): qty 0.55

## 2015-02-21 MED ORDER — ONDANSETRON HCL 4 MG PO TABS: 4.0000 mg | ORAL_TABLET | Freq: Three times a day (TID) | ORAL | Status: DC | PRN

## 2015-02-21 MED ORDER — ATORVASTATIN CALCIUM 10 MG PO TABS: 10.0000 mg | ORAL_TABLET | Freq: Every day | ORAL | Status: DC

## 2015-02-21 MED ORDER — POTASSIUM CHLORIDE 20 MEQ/15ML (10%) PO SOLN
40.0000 meq | Freq: Once | ORAL | Status: AC
  Administered 2015-02-21: 40 meq via ORAL
  Filled 2015-02-21: qty 30

## 2015-02-21 MED ORDER — SODIUM CHLORIDE 0.9 % IJ SOLN
3.0000 mL | Freq: Two times a day (BID) | INTRAMUSCULAR | Status: DC
  Administered 2015-02-21 (×2): 3 mL via INTRAVENOUS

## 2015-02-21 MED ORDER — SPIRONOLACTONE 25 MG PO TABS
25.0000 mg | ORAL_TABLET | Freq: Every day | ORAL | Status: DC
  Administered 2015-02-21: 25 mg via ORAL
  Filled 2015-02-21: qty 1

## 2015-02-21 MED ORDER — SODIUM CHLORIDE 0.9 % IJ SOLN: 3.0000 mL | INTRAMUSCULAR | Status: DC | PRN

## 2015-02-21 MED ORDER — TORSEMIDE 20 MG PO TABS: 20.0000 mg | ORAL_TABLET | Freq: Every day | ORAL | Status: DC

## 2015-02-21 MED ORDER — INSULIN ASPART 100 UNIT/ML ~~LOC~~ SOLN
25.0000 [IU] | Freq: Three times a day (TID) | SUBCUTANEOUS | Status: DC
  Administered 2015-02-21 (×2): 25 [IU] via SUBCUTANEOUS

## 2015-02-21 MED ORDER — POTASSIUM CHLORIDE CRYS ER 20 MEQ PO TBCR: 40.0000 meq | EXTENDED_RELEASE_TABLET | ORAL | Status: DC

## 2015-02-21 MED ORDER — HEPARIN SODIUM (PORCINE) 5000 UNIT/ML IJ SOLN
5000.0000 [IU] | Freq: Three times a day (TID) | INTRAMUSCULAR | Status: DC
  Administered 2015-02-21: 5000 [IU] via SUBCUTANEOUS
  Filled 2015-02-21: qty 1

## 2015-02-21 MED ORDER — CYCLOBENZAPRINE HCL 10 MG PO TABS: 10.0000 mg | ORAL_TABLET | ORAL | Status: DC | PRN

## 2015-02-21 MED ORDER — ONDANSETRON HCL 4 MG/2ML IJ SOLN: 4.0000 mg | Freq: Four times a day (QID) | INTRAMUSCULAR | Status: DC | PRN

## 2015-02-21 MED ORDER — METOLAZONE 5 MG PO TABS
5.0000 mg | ORAL_TABLET | ORAL | Status: DC
  Administered 2015-02-21: 5 mg via ORAL
  Filled 2015-02-21: qty 1

## 2015-02-21 NOTE — Discharge Summary (Addendum)
Sue Martin, is a 49 y.o. female  DOB 1965/12/29  MRN [id number].  Admission date:  02/20/2015  Admitting Physician  Hillary Bow, DO  Discharge Date:  02/21/2015   Primary MD  Terressa Koyanagi., DO  Recommendations for primary care physician for things to follow:   Check CBC, BMP next visit. Follow final urine culture results. She must follow with her cardiologist within a week.   Admission Diagnosis  Acute UTI [N39.0] Acute congestive heart failure, unspecified congestive heart failure type [I50.9]   Discharge Diagnosis  Acute UTI [N39.0] Acute congestive heart failure, unspecified congestive heart failure type [I50.9]     Principal Problem:   Acute on chronic systolic heart failure Active Problems:   CKD (chronic kidney disease) stage 3, GFR 30-59 ml/min   Type II diabetes mellitus with ophthalmic manifestations, uncontrolled      Past Medical History  Diagnosis Date  . Diabetes mellitus with renal complications     PVD and retinopathy  . CHF (congestive heart failure)   . Arteriosclerosis   . Chronic kidney disease   . Liver cirrhosis   . Retinal detachment   . Lyme disease   . Glaucoma   . CAD (coronary artery disease) 12/12/2014    -s/p 2V PCI of the LAD/RCA with taxus stents -cardiologist is Dr. Carmon Sails, [facility]   . Depression     Past Surgical History  Procedure Laterality Date  . Leg amputation below knee  09/09/2011, 09/11/2011  . Tubal ligation    . Lasik         HPI  from the history and physical done on the day of admission:    Sue Martin is a 49 y.o. female with h/o CHF, patient presents to the ED with SOB. Onset 4-5 days ago, worsening with significant increase of last day and a half. Feels she has large amount of weight gain over the past week but dosent weigh her self at  home. Non-productive cough. Decreased urination despite taking diuretic at home.     Hospital Course:     1. Acute on chronic systolic heart failure. No recent echogram in the chart, patient does not want to stay to get repeat echogram and she is eager to be discharged as she is now symptom-free after an extra dose of IV Lasix. [facility], per her cardiologist's note patient has chronic systolic heart failure however could not access echo reports or EF. Also reviewed patient's primary care physician's note in care everywhere epic. No mention of EF.  Patient has history of chronic systolic heart failure is on Demadex 20 mg twice a day and Zaroxolyn 5 mg daily, she went into mild acute on chronic systolic heart failure with some shortness of breath, received extra IV Lasix in the ER with resolution of symptoms. This morning she is completely symptom free and wants to be discharged ASAP. She has some work at home as she is currently moving, she does not want to undergo any  further testing. As she symptom-free I will discharge her with close outpatient PCP and cardiology follow-up. Have resumed her home medications and counseled her strictly on salt and water intake. Have also counseled her on daily weight check.   I will resume Her home diuretic regimen along with ACE inhibitor. She is not on beta blocker-for this to her primary cardiologist.   2. Chronic kidney disease stage IV. [facility] 1.4, she is at or better than baseline.   3. CAD. No acute issues no chest pain. Continue aspirin, Plavix and statin for secondary prevention.   4. DM type II. Home regimen started.   5. UTI. Continue on Vantin for 5 more days, good clinical response to Rocephin.   6. Bilateral BKA. Supportive care.   7. Hypokalemia. Replaced. Request PCP to recheck next visit.    Discharge Condition: Stable  Follow UP  Follow-up  Information    Follow up with Kriste Basque R., DO. Schedule an appointment as soon as possible for a visit in 3 days.   Specialty:  Family Medicine   Contact information:   [address] [place] Flake [place] [address] [phone]       Follow up with [facility], MD. Schedule an appointment as soon as possible for a visit in 1 week.   Specialty:  Cardiology   Why:  CHF   Contact information:   [address] [place] [address] [phone]        Consults obtained - None  Diet and Activity recommendation: See Discharge Instructions below  Discharge Instructions       Discharge Instructions    Discharge instructions    Complete by:  As directed   Follow with Primary MD Terressa Koyanagi., DO and your Cardiologist in 3 days   Get CBC, CMP, 2 view Chest X ray checked  by Primary MD next visit.    Activity: As tolerated with Full fall precautions use walker/cane & assistance as needed   Disposition Home     Diet: Heart Healthy Low Carb.  - Check your Weight same time everyday, if you gain over 2 pounds, or you develop in leg swelling, experience more shortness of breath or chest pain, call your Primary MD immediately. Follow Cardiac Low Salt Diet and 1.5 lit/day fluid restriction.   On your next visit with your primary care physician please Get Medicines reviewed and adjusted.   Please request your Prim.MD to go over all Hospital Tests and Procedure/Radiological results at the follow up, please get all Hospital records sent to your Prim MD by signing hospital release before you go home.   If you experience worsening of your admission symptoms, develop shortness of breath, life threatening emergency, suicidal or homicidal thoughts you must seek medical attention immediately by calling 911 or calling your MD immediately  if symptoms less severe.  You Must read complete instructions/literature along with all the possible adverse reactions/side effects for all the  Medicines you take and that have been prescribed to you. Take any new Medicines after you have completely understood and accpet all the possible adverse reactions/side effects.   Do not drive, operating heavy machinery, perform activities at heights, swimming or participation in water activities or provide baby sitting services if your were admitted for syncope or siezures until you have seen by Primary MD or a Neurologist and advised to do so again.  Do not drive when taking Pain medications.    Do not  take more than prescribed Pain, Sleep and Anxiety Medications  Special Instructions: If you have smoked or chewed Tobacco  in the last 2 yrs please stop smoking, stop any regular Alcohol  and or any Recreational drug use.  Wear Seat belts while driving.   Please note  You were cared for by a hospitalist during your hospital stay. If you have any questions about your discharge medications or the care you received while you were in the hospital after you are discharged, you can call the unit and asked to speak with the hospitalist on call if the hospitalist that took care of you is not available. Once you are discharged, your primary care physician will handle any further medical issues. Please note that NO REFILLS for any discharge medications will be authorized once you are discharged, as it is imperative that you return to your primary care physician (or establish a relationship with a primary care physician if you do not have one) for your aftercare needs so that they can reassess your need for medications and monitor your lab values.     Increase activity slowly    Complete by:  As directed              Discharge Medications       Medication List    STOP taking these medications        ibuprofen 200 MG tablet  Commonly known as:  ADVIL,MOTRIN      TAKE these medications        aspirin 325 MG tablet  Take 325 mg by mouth.     atorvastatin 10 MG tablet  Commonly known as:   LIPITOR  Take 10 mg by mouth daily.     cefpodoxime 200 MG tablet  Commonly known as:  VANTIN  Take 1 tablet (200 mg total) by mouth 2 (two) times daily.     clopidogrel 75 MG tablet  Commonly known as:  PLAVIX  Take 75 mg by mouth daily.     cyclobenzaprine 10 MG tablet  Commonly known as:  FLEXERIL  Take 10 mg by mouth as needed.     glucose blood test strip  Check sugars four times daily   Dx E11.9     insulin glargine 100 UNIT/ML injection  Commonly known as:  LANTUS  Inject 40 Units into the skin 2 (two) times daily.     LANTUS SOLOSTAR 100 UNIT/ML Solostar Pen  Generic drug:  Insulin Glargine  Inject 100 mLs into the skin 2 (two) times daily.     insulin lispro 100 UNIT/ML KiwkPen  Commonly known as:  HUMALOG  Inject 25 units three times a day with meals     lisinopril 10 MG tablet  Commonly known as:  PRINIVIL,ZESTRIL  Take 10 mg by mouth.     metolazone 5 MG tablet  Commonly known as:  ZAROXOLYN  Take by mouth daily. 30 minutes before Demadex dose     ondansetron 4 MG tablet  Commonly known as:  ZOFRAN  Take 1 tablet (4 mg total) by mouth every 8 (eight) hours as needed for nausea or vomiting.     spironolactone 25 MG tablet  Commonly known as:  ALDACTONE  Take 25 mg by mouth daily.     torsemide 20 MG tablet  Commonly known as:  DEMADEX  Take 1 tablet (20 mg total) by mouth 2 (two) times daily.        Major procedures and Radiology Reports - PLEASE review  detailed and final reports for all details, in brief -       Dg Chest 2 View  02/20/2015   CLINICAL DATA:  Shortness of breath x 2 days. Sates her grown son tried sitting on her lap when symptoms began.Hx: Diabetes, CHF, CAD, Bilateral leg amputation.  EXAM: CHEST  2 VIEW  COMPARISON:  None.  FINDINGS: Cardiac silhouette is mildly enlarged. No mediastinal or hilar masses or evidence of adenopathy.  Lungs show interstitial thickening including thickening of the fissures. No focal lung consolidation.  No pleural effusion or pneumothorax.  Bony thorax is intact.  IMPRESSION: Findings most consistent with mild congestive heart failure. No evidence of pneumonia.   Electronically Signed   By: Amie Portland M.D.   On: 02/20/2015 18:38    Micro Results      No results found for this or any previous visit (from the past 240 hour(s)).     Today   Subjective    Margarett Beechy today has no headache,no chest abdominal pain,no new weakness tingling or numbness, feels much better wants to go home today.     Objective   Blood pressure 113/63, pulse 81, temperature 97.9 F (36.6 C), temperature source Oral, resp. rate 20, height 4\' 7"  (1.397 m), weight 102.962 kg (226 lb 15.8 oz), last menstrual period 01/29/2015, SpO2 95 %.   Intake/Output Summary (Last 24 hours) at 02/21/15 1059 Last data filed at 02/21/15 0930  Gross per 24 hour  Intake    603 ml  Output    800 ml  Net   -197 ml    Exam Awake Alert, Oriented x 3, No new F.N deficits, Normal affect [place].AT,PERRAL Supple Neck,No JVD, No cervical lymphadenopathy appriciated.  Symmetrical Chest wall movement, Good air movement bilaterally, CTAB RRR,No Gallops,Rubs or new Murmurs, No Parasternal Heave +ve B.Sounds, Abd Soft, Non tender, No organomegaly appriciated, No rebound -guarding or rigidity. No Cyanosis, Clubbing or edema, No new Rash or bruise, bilateral BKA   Data Review   CBC w Diff: Lab Results  Component Value Date   WBC 8.2 02/20/2015   HGB 13.0 02/20/2015   HCT 41.0 02/20/2015   PLT 160 02/20/2015    CMP: Lab Results  Component Value Date   NA 139 02/21/2015   K 3.1* 02/21/2015   CL 98* 02/21/2015   CO2 32 02/21/2015   BUN 13 02/21/2015   CREATININE 1.31* 02/21/2015   PROT 6.7 12/01/2014   ALBUMIN 3.8 12/01/2014   BILITOT 0.5 12/01/2014   ALKPHOS 67 12/01/2014   AST 13 12/01/2014   ALT 10 12/01/2014  .   Total Time in preparing paper work, data evaluation and todays exam - 35  minutes  Leroy Sea M.D on 02/21/2015 at 10:59 AM  [facility]   Office  [phone]

## 2015-02-21 NOTE — Discharge Instructions (Signed)
Follow with Primary MD Terressa Koyanagi., DO and your Cardiologist in 3 days   Get CBC, CMP, 2 view Chest X ray checked  by Primary MD next visit.    Activity: As tolerated with Full fall precautions use walker/cane & assistance as needed   Disposition Home     Diet: Heart Healthy Low Carb.  - Check your Weight same time everyday, if you gain over 2 pounds, or you develop in leg swelling, experience more shortness of breath or chest pain, call your Primary MD immediately. Follow Cardiac Low Salt Diet and 1.5 lit/day fluid restriction.   On your next visit with your primary care physician please Get Medicines reviewed and adjusted.   Please request your Prim.MD to go over all Hospital Tests and Procedure/Radiological results at the follow up, please get all Hospital records sent to your Prim MD by signing hospital release before you go home.   If you experience worsening of your admission symptoms, develop shortness of breath, life threatening emergency, suicidal or homicidal thoughts you must seek medical attention immediately by calling 911 or calling your MD immediately  if symptoms less severe.  You Must read complete instructions/literature along with all the possible adverse reactions/side effects for all the Medicines you take and that have been prescribed to you. Take any new Medicines after you have completely understood and accpet all the possible adverse reactions/side effects.   Do not drive, operating heavy machinery, perform activities at heights, swimming or participation in water activities or provide baby sitting services if your were admitted for syncope or siezures until you have seen by Primary MD or a Neurologist and advised to do so again.  Do not drive when taking Pain medications.    Do not take more than prescribed Pain, Sleep and Anxiety Medications  Special Instructions: If you have smoked or chewed Tobacco  in the last 2 yrs please stop smoking, stop any regular  Alcohol  and or any Recreational drug use.  Wear Seat belts while driving.   Please note  You were cared for by a hospitalist during your hospital stay. If you have any questions about your discharge medications or the care you received while you were in the hospital after you are discharged, you can call the unit and asked to speak with the hospitalist on call if the hospitalist that took care of you is not available. Once you are discharged, your primary care physician will handle any further medical issues. Please note that NO REFILLS for any discharge medications will be authorized once you are discharged, as it is imperative that you return to your primary care physician (or establish a relationship with a primary care physician if you do not have one) for your aftercare needs so that they can reassess your need for medications and monitor your lab values.

## 2015-02-21 NOTE — H&P (Signed)
[facility] History and Physical  Sue Martin WUJ:[id number] DOB: 1966-01-06 DOA: 02/20/2015  Referring physician: EDP PCP: Terressa Koyanagi., DO   Chief Complaint: SOB   HPI: Sue Martin is a 49 y.o. female with h/o CHF, patient presents to the ED with SOB.  Onset 4-5 days ago, worsening with significant increase of last day and a half.  Feels she has large amount of weight gain over the past week but dosent weigh her self at home.  Non-productive cough.  Decreased urination despite taking diuretic at home.  Review of Systems: Systems reviewed.  As above, otherwise negative  Past Medical History  Diagnosis Date  . Diabetes mellitus with renal complications     PVD and retinopathy  . CHF (congestive heart failure)   . Arteriosclerosis   . Chronic kidney disease   . Liver cirrhosis   . Retinal detachment   . Lyme disease   . Glaucoma   . CAD (coronary artery disease) 12/12/2014    -s/p 2V PCI of the LAD/RCA with taxus stents -cardiologist is Dr. Carmon Sails, [facility]   . Depression    Past Surgical History  Procedure Laterality Date  . Leg amputation below knee  09/09/2011, 09/11/2011  . Tubal ligation    . Lasik     Social History:  reports that she has never smoked. She does not have any smokeless tobacco history on file. She reports that she does not drink alcohol or use illicit drugs.  Allergies  Allergen Reactions  . Broccoli [Brassica Oleracea Italica] Anaphylaxis  . Trulicity [Dulaglutide] Hives and Nausea And Vomiting  . Iron Other (See Comments)    GI intolerance  . Latex Other (See Comments)    blisters  . Penicillins Other (See Comments)    GI intolerance Tolerates cephalosporins  . Codeine Nausea And Vomiting and Rash  . Sulfa Antibiotics Nausea And Vomiting and Rash    Family History  Problem Relation Age of Onset  . Arthritis Mother   . Heart disease Mother   . Mental illness Mother   . Diabetes Mother   . Arthritis Maternal Grandmother   .  Diabetes Maternal Grandmother   . Arthritis Father   . CVA Father   . Hypertension Father   . Sudden death Paternal Grandfather      Prior to Admission medications   Medication Sig Start Date End Date Taking? Authorizing Provider  aspirin 325 MG tablet Take 325 mg by mouth.    Historical Provider, MD  atorvastatin (LIPITOR) 10 MG tablet Take 10 mg by mouth daily.    Historical Provider, MD  clopidogrel (PLAVIX) 75 MG tablet Take 75 mg by mouth daily.  06/25/14 06/25/15  Historical Provider, MD  cyclobenzaprine (FLEXERIL) 10 MG tablet Take 10 mg by mouth as needed.  06/25/14   Historical Provider, MD  glucose blood test strip Check sugars four times daily   Dx E11.9 11/21/14 11/21/15  Historical Provider, MD  ibuprofen (ADVIL,MOTRIN) 200 MG tablet Take 200 mg by mouth as needed.    Historical Provider, MD  Insulin Glargine (LANTUS SOLOSTAR) 100 UNIT/ML Solostar Pen Inject 100 mLs into the skin 2 (two) times daily. 12/15/14   Historical Provider, MD  insulin glargine (LANTUS) 100 UNIT/ML injection Inject 40 Units into the skin 2 (two) times daily.    Historical Provider, MD  insulin lispro (HUMALOG) 100 UNIT/ML KiwkPen Inject 25 units three times a day with meals 01/01/15   Reather Littler, MD  lisinopril (PRINIVIL,ZESTRIL) 10 MG tablet Take  10 mg by mouth. 06/25/14 06/25/15  Historical Provider, MD  metolazone (ZAROXOLYN) 5 MG tablet Take by mouth daily. 30 minutes before Demadex dose 06/25/14   Historical Provider, MD  ondansetron (ZOFRAN) 4 MG tablet Take 1 tablet (4 mg total) by mouth every 8 (eight) hours as needed for nausea or vomiting. 01/23/15   Terressa Koyanagi, DO  spironolactone (ALDACTONE) 25 MG tablet Take 25 mg by mouth daily.    Historical Provider, MD   Physical Exam: Filed Vitals:   02/20/15 2339  BP: 136/62  Pulse: 91  Temp: 98.2 F (36.8 C)  Resp: 20    BP 136/62 mmHg  Pulse 91  Temp(Src) 98.2 F (36.8 C) (Oral)  Resp 20  Ht 4\' 7"  (1.397 m)  Wt 102.962 kg (226 lb 15.8 oz)   BMI 52.76 kg/m2  SpO2 98%  LMP 01/29/2015  General Appearance:    Alert, oriented, no distress, appears stated age  Head:    Normocephalic, atraumatic  Eyes:    PERRL, EOMI, sclera non-icteric        Nose:   Nares without drainage or epistaxis. Mucosa, turbinates normal  Throat:   Moist mucous membranes. Oropharynx without erythema or exudate.  Neck:   Supple. No carotid bruits.  No thyromegaly.  No lymphadenopathy.   Back:     No CVA tenderness, no spinal tenderness  Lungs:     Clear to auscultation bilaterally, without wheezes, rhonchi or rales  Chest wall:    No tenderness to palpitation  Heart:    Regular rate and rhythm without murmurs, gallops, rubs  Abdomen:     Soft, non-tender, nondistended, normal bowel sounds, no organomegaly  Genitalia:    deferred  Rectal:    deferred  Extremities:   No clubbing, cyanosis or edema.  Pulses:   2+ and symmetric all extremities  Skin:   Skin color, texture, turgor normal, no rashes or lesions  Lymph nodes:   Cervical, supraclavicular, and axillary nodes normal  Neurologic:   CNII-XII intact. Normal strength, sensation and reflexes      throughout    Labs on Admission:  Basic Metabolic Panel:  Recent Labs Lab 02/20/15 1750  NA 141  K 3.4*  CL 100*  CO2 32  GLUCOSE 116*  BUN 17  CREATININE 1.32*  CALCIUM 9.2   Liver Function Tests: No results for input(s): AST, ALT, ALKPHOS, BILITOT, PROT, ALBUMIN in the last 168 hours. No results for input(s): LIPASE, AMYLASE in the last 168 hours. No results for input(s): AMMONIA in the last 168 hours. CBC:  Recent Labs Lab 02/20/15 1750  WBC 8.2  HGB 13.0  HCT 41.0  MCV 91.5  PLT 160   Cardiac Enzymes:  Recent Labs Lab 02/20/15 1750  TROPONINI <0.03    BNP (last 3 results) No results for input(s): PROBNP in the last 8760 hours. CBG:  Recent Labs Lab 02/20/15 2351  GLUCAP 133*    Radiological Exams on Admission: Dg Chest 2 View  02/20/2015   CLINICAL DATA:   Shortness of breath x 2 days. Sates her grown son tried sitting on her lap when symptoms began.Hx: Diabetes, CHF, CAD, Bilateral leg amputation.  EXAM: CHEST  2 VIEW  COMPARISON:  None.  FINDINGS: Cardiac silhouette is mildly enlarged. No mediastinal or hilar masses or evidence of adenopathy.  Lungs show interstitial thickening including thickening of the fissures. No focal lung consolidation. No pleural effusion or pneumothorax.  Bony thorax is intact.  IMPRESSION: Findings most consistent with  mild congestive heart failure. No evidence of pneumonia.   Electronically Signed   By: Amie Portland M.D.   On: 02/20/2015 18:38    EKG: Independently reviewed.  Assessment/Plan Principal Problem:   Acute on chronic systolic heart failure Active Problems:   CKD (chronic kidney disease) stage 3, GFR 30-59 ml/min   Type II diabetes mellitus with ophthalmic manifestations, uncontrolled   1. Acute on chronic systolic CHF -  1. Heart failure pathway 2. Lasix  IV given in ED with significant improvement 3. Resuming home diuretics PO 1. If working then likely can discharge later today 4. Monitor strict intake and output 5. Daily BMP 2. CKD stage 3 - chronic and stable creatinine 3. DM2 - 1. Continue home lantus 2. Continue home mealtime novolog 3. CBG checks AC/HS    Code Status: Full  Family Communication: Family at bedside Disposition Plan: Admit to obs   Time spent: 70 min  Kenneith Stief M. [facility] Pager [phone]  If 7AM-7PM, please contact the day team taking care of the patient [email or web address] Password [facility] 02/21/2015, 12:54 AM

## 2015-02-22 LAB — URINE CULTURE: Culture: >=100000

## 2015-02-23 ENCOUNTER — Telehealth: Payer: Self-pay | Admitting: *Deleted

## 2015-02-23 ENCOUNTER — Ambulatory Visit (INDEPENDENT_AMBULATORY_CARE_PROVIDER_SITE_OTHER): Payer: PRIVATE HEALTH INSURANCE | Admitting: Family Medicine

## 2015-02-23 ENCOUNTER — Ambulatory Visit: Payer: Medicare Other

## 2015-02-23 ENCOUNTER — Encounter: Payer: Self-pay | Admitting: Family Medicine

## 2015-02-23 VITALS — BP 102/76 | HR 90 | Temp 99.5°F

## 2015-02-23 DIAGNOSIS — N390 Urinary tract infection, site not specified: Secondary | ICD-10-CM

## 2015-02-23 DIAGNOSIS — M25659 Stiffness of unspecified hip, not elsewhere classified: Secondary | ICD-10-CM | POA: Diagnosis not present

## 2015-02-23 DIAGNOSIS — E1121 Type 2 diabetes mellitus with diabetic nephropathy: Secondary | ICD-10-CM | POA: Diagnosis not present

## 2015-02-23 DIAGNOSIS — E876 Hypokalemia: Secondary | ICD-10-CM | POA: Diagnosis not present

## 2015-02-23 DIAGNOSIS — I5022 Chronic systolic (congestive) heart failure: Secondary | ICD-10-CM

## 2015-02-23 DIAGNOSIS — N183 Chronic kidney disease, stage 3 unspecified: Secondary | ICD-10-CM

## 2015-02-23 DIAGNOSIS — R29898 Other symptoms and signs involving the musculoskeletal system: Secondary | ICD-10-CM | POA: Diagnosis not present

## 2015-02-23 DIAGNOSIS — M25669 Stiffness of unspecified knee, not elsewhere classified: Secondary | ICD-10-CM

## 2015-02-23 DIAGNOSIS — M24669 Ankylosis, unspecified knee: Secondary | ICD-10-CM | POA: Diagnosis not present

## 2015-02-23 NOTE — Progress Notes (Signed)
Pre visit review using our clinic review tool, if applicable. No additional management support is needed unless otherwise documented below in the visit note. 

## 2015-02-23 NOTE — Progress Notes (Addendum)
HPI:  Sue Martin is a 49 yo F with a very complicated PMH here for an acute visit for transition from a recent hospitalization.  Per discharge documents, she was hospitalized 8/26-8/27/2016 for CHF and uti - presenting symptoms of dyspnea. Treated with IV lasix. Sees cardiologist for CAD, CsCHF with most recent EF 41% last year per her report done with her cardiologist -  the last ECHO we have was form 2011 - not repeated in hospital with EF 39%. On chronic demadex  bid, zaroxolyn, spironolactone, asa, plavix and lipitor. Reports can't take much diuresis or results in hypokalemia and reports all forms of potassium orally cause nausea and vomiting. Reports not on BB per her cardiologist  Recs. She was also treated with rocephin and vantin for a UTI per discharge notes - did not see this when uCx resulted and macrobid sent but cancelled. Sensitive to ceftriaxone - so can cont Vantin as reports is tolerating well and feels well. Transient hypokal in hospital replaced per notes. CBC ok in hospital. Cr at baseline. Reports BS stable at home. Seeing endocrinology for her diabetes which has been chronically uncontrolled. She reports:she feels much better with improved energy, resolved SOB and resolved cough and resolving urinary symptoms. Denies: malaise, fevers, cough, SOB, swelling, CP, dizziness, DOE, orthopnea. Reports had quarter in her shirt when xray done.   ROS: See pertinent positives and negatives per HPI.  Past Medical History  Diagnosis Date  . Diabetes mellitus with renal complications     PVD and retinopathy, S/p bilat ambutations  . CHF (congestive heart failure)     sees Dr. Wynonia Hazard  . Chronic kidney disease   . Liver cirrhosis   . Retinal detachment     sees Dr. Johna Sheriff per her report  . Lyme disease   . Glaucoma   . CAD (coronary artery disease) 12/12/2014    -s/p 2V PCI of the LAD/RCA with taxus stents -cardiologist is Dr. Carmon Sails, [place] Savage   . Depression     Past  Surgical History  Procedure Laterality Date  . Leg amputation below knee  09/09/2011, 09/11/2011  . Tubal ligation    . Lasik      Family History  Problem Relation Age of Onset  . Arthritis Mother   . Heart disease Mother   . Mental illness Mother   . Diabetes Mother   . Arthritis Maternal Grandmother   . Diabetes Maternal Grandmother   . Arthritis Father   . CVA Father   . Hypertension Father   . Sudden death Paternal Grandfather     Social History   Social History  . Marital Status: Married    Spouse Name: N/A  . Number of Children: N/A  . Years of Education: N/A   Social History Main Topics  . Smoking status: Never Smoker   . Smokeless tobacco: None  . Alcohol Use: No  . Drug Use: No  . Sexual Activity: Not Asked   Other Topics Concern  . None   Social History Narrative   Married   Husband is a Naval architect        Current outpatient prescriptions:  .  aspirin 325 MG tablet, Take 325 mg by mouth at bedtime. , Disp: , Rfl:  .  atorvastatin (LIPITOR) 10 MG tablet, Take 10 mg by mouth daily at 6 PM. , Disp: , Rfl:  .  cefpodoxime (VANTIN) 200 MG tablet, Take 1 tablet (200 mg total) by mouth 2 (two) times daily., Disp: 12  tablet, Rfl: 0 .  clopidogrel (PLAVIX) 75 MG tablet, Take 75 mg by mouth at bedtime. , Disp: , Rfl:  .  cyclobenzaprine (FLEXERIL) 10 MG tablet, Take 10 mg by mouth 3 (three) times daily as needed for muscle spasms. , Disp: , Rfl:  .  glucose blood test strip, Check sugars four times daily   Dx E11.9, Disp: , Rfl:  .  Insulin Glargine (LANTUS SOLOSTAR) 100 UNIT/ML Solostar Pen, Inject 40-55 Units into the skin 2 (two) times daily. 55 units every morning and 40 units every evening., Disp: , Rfl:  .  insulin lispro (HUMALOG) 100 UNIT/ML KiwkPen, Inject 25 units three times a day with meals, Disp: 30 mL, Rfl: 3 .  lisinopril (PRINIVIL,ZESTRIL) 10 MG tablet, Take 10 mg by mouth at bedtime. , Disp: , Rfl:  .  metolazone (ZAROXOLYN) 5 MG tablet, Take by  mouth daily. 30 minutes before Demadex dose, Disp: , Rfl:  .  ondansetron (ZOFRAN) 4 MG tablet, Take 1 tablet (4 mg total) by mouth every 8 (eight) hours as needed for nausea or vomiting., Disp: 20 tablet, Rfl: 0 .  torsemide (DEMADEX) 20 MG tablet, Take 1 tablet (20 mg total) by mouth 2 (two) times daily., Disp: 60 tablet, Rfl: 0 .  torsemide (DEMADEX) 20 MG tablet, Take 20 mg by mouth 2 (two) times daily., Disp: , Rfl:   EXAM:  Filed Vitals:   02/23/15 1517  BP: 102/76  Pulse: 90  Temp: 99.5 F (37.5 C)    There is no weight on file to calculate BMI.  GENERAL: vitals reviewed and listed above, alert, oriented, appears well hydrated and in no acute distress  HEENT: atraumatic, conjunttiva clear, no obvious abnormalities on inspection of external nose and ears  NECK: no obvious masses on inspection, no JVD  LUNGS: clear to auscultation bilaterally, no wheezes, rales or rhonchi, good air movement  CV: HRRR  MS: moves all extremities without noticeable abnormality; bilat amputations  PSYCH: pleasant and cooperative, no obvious depression or anxiety  ASSESSMENT AND PLAN:  Discussed the following assessment and plan:  Chronic systolic congestive heart failure  UTI (lower urinary tract infection)  CKD (chronic kidney disease) stage 3, GFR 30-59 ml/min  Type 2 diabetes mellitus with diabetic nephropathy  Hypokalemia - Plan: Basic metabolic panel  -check BMP -ensure follow up with cardiologist - assistant to contact their office to assist her with appt, request records as well -cleared up confusion on abx for UTI - Vantin good and is to be continued to complete course -reports she sees endo this week for her diabetes -improved and reports doing well, but O2 still mildly low, she reports better then when in hospital, advised double dose demadex in am for 3 days if K+ ok until sees cardiology - she is reluctant without talking with her cardiologist -reports can't tolerate any  form of oral potassium as causes nausea and vomiting -dietary recs for chf discussed -lifestyle recs for diabetes and has appt with endo -Patient advised to return or notify a doctor immediately if symptoms worsen or persist or new concerns arise. -offered pneumonia and Tdap -advised eye exam -follow up 3 months  Theron Arista Rogaski [facility] - cardiology  There are no Patient Instructions on file for this visit.   Kriste Basque R.

## 2015-02-23 NOTE — Therapy (Signed)
[facility] [address] [place], [place], [address] Phone: [phone]   Fax:  [phone]  Physical Therapy Treatment  Patient Details  Name: Sue Martin MRN: [id number] Date of Birth: [birth year] Referring Provider:  Terressa Koyanagi, DO  Encounter Date: 02/23/2015      PT End of Session - 02/23/15 1309    Visit Number 4   Number of Visits 10   Date for PT Re-Evaluation 03/02/15   PT Start Time 1231   PT Stop Time 1310   PT Time Calculation (min) 39 min   Activity Tolerance Patient tolerated treatment well   Behavior During Therapy [facility] for tasks assessed/performed      Past Medical History  Diagnosis Date  . Diabetes mellitus with renal complications     PVD and retinopathy, S/p bilat ambutations  . CHF (congestive heart failure)     sees Dr. Wynonia Hazard  . Chronic kidney disease   . Liver cirrhosis   . Retinal detachment     sees Dr. Johna Sheriff per her report  . Lyme disease   . Glaucoma   . CAD (coronary artery disease) 12/12/2014    -s/p 2V PCI of the LAD/RCA with taxus stents -cardiologist is Dr. Carmon Sails, [place]eledonio Savage   . Depression     Past Surgical History  Procedure Laterality Date  . Leg amputation below knee  09/09/2011, 09/11/2011  . Tubal ligation    . Lasik      There were no vitals filed for this visit.  Visit Diagnosis:  Weakness of both lower extremities  Decreased range of motion (ROM) of knee  Stiffness of hip joint, unspecified laterality      Subjective Assessment - 02/23/15 1233    Subjective Pt's has her son and husband with her today.  They want to be able to assist at home with exercises/stretching.     Currently in Pain? Yes   Pain Score 6    Pain Location Leg   Pain Orientation Right;Left   Pain Descriptors / Indicators Aching   Pain Type Chronic pain   Pain Frequency Constant   Aggravating Factors  sitting   Pain Relieving Factors laying down, not stretching leg                          [facility] Adult PT Treatment/Exercise - 02/23/15 0001    Lumbar Exercises: Stretches   Single Knee to Chest Stretch 3 reps;20 seconds   Single Knee to Chest Stretch Limitations Needsa assistance with towel to get into position   Lumbar Exercises: Supine   Straight Leg Raise 20 reps   Other Supine Lumbar Exercises Hip IR stretch 3 x20 sec    Knee/Hip Exercises: Stretches   Other Knee/Hip Stretches prolonged stretch into extension.  End of legs placed on blue pad 4x1 minute hold with verbal cues to keep hips neutral.    Knee/Hip Exercises: Supine   Short Arc Quad Sets AROM;Both;2 sets;5 sets   Knee/Hip Exercises: Sidelying   Hip ABduction 2 sets;10 reps;Both  verbal cues for not flexing hip   Manual Therapy   Manual Therapy Passive ROM   Manual therapy comments PROM and prolonged hold (gentle) into knee extension                PT Education - 02/23/15 1249    Education Details HEP: sidelying abduction, SLR, prolonged knee extension stretch   Person(s) Educated Patient;Spouse;Child(ren)   Methods  Explanation;Demonstration;Handout   Comprehension Verbalized understanding;Returned demonstration          PT Short Term Goals - 02/23/15 1236    PT SHORT TERM GOAL #1   Title Be independent with home exercise program    Status Achieved   PT SHORT TERM GOAL #2   Title Decrease bil. knee pain by 25% with wheelchair <> bed transfers due to improved knee flexiblity    Time 4   Period Weeks   Status On-going  No change in pain           PT Long Term Goals - 01/05/15 1224    PT LONG TERM GOAL #1   Title Be independent with advanced HEP.    Time 8   Period Weeks   Status New   PT LONG TERM GOAL #2   Title Improve hip flexion and knee extension strength to 4-/5 bilaterally to improve ease with mobility and transfers   Time 8   Period Weeks   Status New   PT LONG TERM GOAL #3   Title decrease bilateral knee pain by 40% with transfers  due to improved flexiblity   Time 8   Period Weeks   Status New   PT LONG TERM GOAL #4   Title Caregiver verbalizes understanding of how to assist with LE stretching techniques at home    Time 8   Period Weeks   Status New   PT LONG TERM GOAL #5   Title Reduce FOTO limitation to < or = to 47%    Time 8   Period Weeks   Status New               Plan - 02/23/15 1256    Clinical Impression Statement Pt had family with her today and PT showed pt's family how to assist with therapy at home.  Pt with increased pain with PROM/prolonged stretch into extenison on the Lt knee.  Pt did tolerate prolonged extension better today but still painful. Pt with not significant change in pain in the knees when sitting.  Pt with continued AROM deficits due to immobility and prolonged positioning.     Pt will benefit from skilled therapeutic intervention in order to improve on the following deficits Decreased range of motion;Obesity;Impaired vision/preception;Pain;Impaired flexibility;Decreased strength   Rehab Potential Good   PT Frequency 2x / week   PT Duration 8 weeks   PT Treatment/Interventions ADLs/Self Care Home Management;Therapeutic activities;Therapeutic exercise;Prosthetic Training;Cryotherapy;Electrical Stimulation;Neuromuscular re-education;Patient/family education;Passive range of motion;Compression bandaging;Energy conservation   PT Next Visit Plan Continue HEP advancement, PROM into knee extension.  Hip strength.  Renewal is needed next session.     Consulted and Agree with Plan of Care Patient        Problem List Patient Active Problem List   Diagnosis Date Noted  . Acute on chronic systolic heart failure 02/21/2015  . Type II diabetes mellitus with ophthalmic manifestations, uncontrolled 12/22/2014  . Hyperlipidemia 12/22/2014  . CAD (coronary artery disease) 12/12/2014  . Diabetic retinopathy 12/12/2014  . PVD (peripheral vascular disease) 12/12/2014  . CKD (chronic  kidney disease) stage 3, GFR 30-59 ml/min 12/12/2014  . Type 2 diabetes mellitus with diabetic nephropathy 11/28/2014  . Chronic systolic congestive heart failure 11/28/2014  . Cirrhosis 11/28/2014  . Generalized OA 11/28/2014  . S/P bilateral BKA (below knee amputation) 11/28/2014    Lundon Rosier, PT 02/23/2015, 1:10 PM  [facility] [address] [place], [place], [address] Phone:  [phone]   Fax:  [phone]

## 2015-02-23 NOTE — Patient Instructions (Signed)
BEFORE YOU LEAVE: -labs -check if she wants flu and Tdap vaccines -follow up in 3 months  Complete the antibiotic for the UTI  Keep follow up with your cardiologist and diabetes doctors  Please watch sodium and fluid intake per instructions and call your cardiologist immediately if difficulty breathing recurs.  Get an eye exam yearly for your diabetes

## 2015-02-23 NOTE — Telephone Encounter (Signed)
Transition Care Management Follow-up Telephone Call How have you been since you were released from the hospital? Not too bad  Do you understand why you were in the hospital? yes   Do you understand the discharge instrcutions? yes  Items Reviewed:  Medications reviewed: yes - would like a refill of metolazone  Allergies reviewed: yes  Dietary changes reviewed: yes  Referrals reviewed: yes   Functional Questionnaire:   Activities of Daily Living (ADLs):   She states they are independent in the following: bathing and hygiene, feeding, continence, grooming, toileting and dressing - using a wheelchair States they require assistance with the following: none   Any transportation issues/concerns?: no   Any patient concerns? no   Confirmed importance and date/time of follow-up visits scheduled: yes   Confirmed with patient if condition begins to worsen call PCP or go to the ER.  Patient was given the Call-a-Nurse line [phone]: yes Patient was discharged 02/21/15 Patient was discharged to home Patient has an appointment with Dr Selena Batten 02/23/15

## 2015-02-23 NOTE — Telephone Encounter (Signed)
Per Dr Selena Batten I called the pts cardiologist Dr Wynonia Hazard and spoke with [facility] and scheduled the pt an appt for 03/04/2015 at 11am as a work-in to be seen post ER visit due to CHF. The pt was given appt info and I faxed Dr Elmyra Ricks office note to [phone].

## 2015-02-23 NOTE — Patient Instructions (Signed)
Quad Set / Hip Extension   With towel roll under calf of residual limb, tighten thigh muscle to straighten knee. Push down into towel roll to lift buttocks. Hold _60___ seconds. Repeat _3___ times. Do _3___ sessions per day.  Copyright  VHI. All rights reserved.  Straight Leg Raise   Squeeze pelvic floor and hold. Tighten top of left thigh. Raise leg off bed ___ inches. Hold for _3__ seconds. Relax for _5__ seconds. Repeat _10__ times. Do __1-2_ times a day. Repeat with other leg.    Copyright  VHI. All rights reserved.  Knee to Chest   Lying supine, bend involved knee to chest, hold 20 seconds  _3__ times. Repeat with other leg. Do _3__ times per day.  Abduction: Clam (Eccentric) - Side-Lying   Lie on side with knees bent. Lift top knee, keeping feet together. Keep trunk steady. Slowly lower for 3-5 seconds. _10__ reps per set, __1-2_ sets per day, _2_ days per week.   Copyright  VHI. All rights reserved.   [facility] [address], [address] [place], [place] [address] Phone # [phone] Fax [phone]

## 2015-02-24 DIAGNOSIS — H02834 Dermatochalasis of left upper eyelid: Secondary | ICD-10-CM | POA: Diagnosis not present

## 2015-02-24 DIAGNOSIS — H43813 Vitreous degeneration, bilateral: Secondary | ICD-10-CM | POA: Diagnosis not present

## 2015-02-24 DIAGNOSIS — H50111 Monocular exotropia, right eye: Secondary | ICD-10-CM | POA: Diagnosis not present

## 2015-02-24 DIAGNOSIS — H02831 Dermatochalasis of right upper eyelid: Secondary | ICD-10-CM | POA: Diagnosis not present

## 2015-02-24 LAB — BASIC METABOLIC PANEL
BUN: 27 mg/dL — ABNORMAL HIGH (ref 6–23)
CALCIUM: 9.4 mg/dL (ref 8.4–10.5)
CHLORIDE: 100 meq/L (ref 96–112)
CO2: 29 meq/L (ref 19–32)
Creatinine, Ser: 1.46 mg/dL — ABNORMAL HIGH (ref 0.40–1.20)
GFR: 40.62 mL/min — ABNORMAL LOW (ref >60.00)
GLUCOSE: 111 mg/dL — AB (ref 70–99)
POTASSIUM: 3.8 meq/L (ref 3.5–5.1)
SODIUM: 139 meq/L (ref 135–145)

## 2015-02-24 LAB — HM DIABETES EYE EXAM

## 2015-02-25 ENCOUNTER — Encounter: Payer: Self-pay | Admitting: Endocrinology

## 2015-02-25 ENCOUNTER — Ambulatory Visit (INDEPENDENT_AMBULATORY_CARE_PROVIDER_SITE_OTHER): Payer: PRIVATE HEALTH INSURANCE | Admitting: Endocrinology

## 2015-02-25 VITALS — BP 130/90 | HR 92 | Temp 98.3°F | Resp 16

## 2015-02-25 DIAGNOSIS — E1139 Type 2 diabetes mellitus with other diabetic ophthalmic complication: Secondary | ICD-10-CM | POA: Diagnosis not present

## 2015-02-25 DIAGNOSIS — E1165 Type 2 diabetes mellitus with hyperglycemia: Secondary | ICD-10-CM | POA: Diagnosis not present

## 2015-02-25 DIAGNOSIS — IMO0002 Reserved for concepts with insufficient information to code with codable children: Secondary | ICD-10-CM

## 2015-02-25 DIAGNOSIS — I25810 Atherosclerosis of coronary artery bypass graft(s) without angina pectoris: Secondary | ICD-10-CM

## 2015-02-25 NOTE — Progress Notes (Signed)
Patient ID: Sue Martin, female   DOB: 24-Jan-1966, 49 y.o.   MRN: [id number]           Reason for Appointment: Follow-up for Type 2 Diabetes  Referring physician: None  History of Present Illness:          Date of diagnosis of type 2 diabetes mellitus: ?  2007        Background history:  She had gestational diabetes several years ago Subsequently she was not monitored and apparently in 2007 she was diagnosed to have diabetes when she was being evaluated for cardiac problems Pneumonia.  Her blood sugar was about 700 She was started on insulin and has only been on insulin since diagnosis She thinks her blood sugars have been usually very difficult to control and has been generally followed by her primary care physician only Her A1c record indicates her level has been mostly around 10-11 in 2015 and early 2016  Recent history:  On her initial consultation because of her high A1c of 8.9% she was told to increase her insulin doses Also she was supposed to start on Trulicity but was having significant amount of vomiting with this and stopped this.  INSULIN regimen is described as: 55-40 units of Lantus bid Humalog 25 units with meals     Current blood sugar patterns and problems identified:  She was told to take her Humalog before eating and has been trying to do this usually  She has increase her mealtime doses by 10 units compared to previous doses but she thinks her blood sugars after supper are still high and has had some readings over 300  Since her monitor has the wrong date and time difficult to know what her blood sugar patterns are but does not appear to have consistently high readings in the mornings  She has some high readings during the day also but not consistent and since she has not kept a diary of her blood sugars unable to get a clear idea of her blood sugar patterns  She does not adjust her Humalog based on what she is eating  Compliance with the medical regimen:  Fair Hypoglycemia: never   Glucose monitoring:  done 3-4 times a day         Glucometer: Prodigy.      Blood Glucose readings by review of her monitor showed average 242  Self-care: The diet that the patient has been following is: tries to limit portions .     Meal times: Breakfast: 10 am Lunch: 3-4 pm, usually not eating a full dinner   Typical meal intake: Breakfast is his usually a granola bar or light yogurt, lunch may be sandwich or pasta and soups, dinner is usually yogurt or other snacks like fruit or sugar-free cookies               Dietician visit, most recent: none, only in the hospital               Exercise:  unable to do any  Weight history: Around the time of diagnosis overweight was 350, recently has been fairly stable around 175  Wt Readings from Last 3 Encounters:  02/20/15 226 lb 15.8 oz (102.962 kg)    Glycemic control:   Lab Results  Component Value Date   HGBA1C 8.9* 12/01/2014   Lab Results  Component Value Date   MICROALBUR 17.8* 12/22/2014   LDLCALC 97 12/01/2014   CREATININE 1.46* 02/23/2015  Medication List       This list is accurate as of: 02/25/15  2:38 PM.  Always use your most recent med list.               aspirin 325 MG tablet  Take 325 mg by mouth at bedtime.     atorvastatin 10 MG tablet  Commonly known as:  LIPITOR  Take 10 mg by mouth daily at 6 PM.     cefpodoxime 200 MG tablet  Commonly known as:  VANTIN  Take 1 tablet (200 mg total) by mouth 2 (two) times daily.     clopidogrel 75 MG tablet  Commonly known as:  PLAVIX  Take 75 mg by mouth at bedtime.     cyclobenzaprine 10 MG tablet  Commonly known as:  FLEXERIL  Take 10 mg by mouth 3 (three) times daily as needed for muscle spasms.     glucose blood test strip  Check sugars four times daily   Dx E11.9     insulin lispro 100 UNIT/ML KiwkPen  Commonly known as:  HUMALOG  Inject 25 units three times a day with meals     LANTUS SOLOSTAR 100 UNIT/ML  Solostar Pen  Generic drug:  Insulin Glargine  Inject 40-55 Units into the skin 2 (two) times daily. 55 units every morning and 40 units every evening.     lisinopril 10 MG tablet  Commonly known as:  PRINIVIL,ZESTRIL  Take 10 mg by mouth at bedtime.     metolazone 5 MG tablet  Commonly known as:  ZAROXOLYN  Take by mouth daily. 30 minutes before Demadex dose     ondansetron 4 MG tablet  Commonly known as:  ZOFRAN  Take 1 tablet (4 mg total) by mouth every 8 (eight) hours as needed for nausea or vomiting.     torsemide 20 MG tablet  Commonly known as:  DEMADEX  Take 1 tablet (20 mg total) by mouth 2 (two) times daily.        Allergies:  Allergies  Allergen Reactions  . Broccoli [Brassica Oleracea Italica] Anaphylaxis  . Trulicity [Dulaglutide] Hives and Nausea And Vomiting  . Iron Other (See Comments)    GI intolerance  . Latex Other (See Comments)    blisters  . Lisinopril Nausea Only  . Penicillins Other (See Comments)    GI intolerance, UTI, can tolerate in IV Tolerates cephalosporins  . Tape Other (See Comments)    Tears skin.  Please use "paper" tape  . Codeine Nausea And Vomiting and Rash  . Sulfa Antibiotics Nausea And Vomiting and Rash  . Sulfur Nausea And Vomiting and Rash    Past Medical History  Diagnosis Date  . Diabetes mellitus with renal complications     PVD and retinopathy, S/p bilat ambutations  . CHF (congestive heart failure)     sees Dr. Wynonia Hazard  . Chronic kidney disease   . Liver cirrhosis   . Retinal detachment     sees Dr. Johna Sheriff per her report  . Lyme disease   . Glaucoma   . CAD (coronary artery disease) 12/12/2014    -s/p 2V PCI of the LAD/RCA with taxus stents -cardiologist is Dr. Carmon Sails, Celedonio Savage   . Depression     Past Surgical History  Procedure Laterality Date  . Leg amputation below knee  09/09/2011, 09/11/2011  . Tubal ligation    . Lasik      Family History  Problem Relation Age of Onset  .  Arthritis Mother   .  Heart disease Mother   . Mental illness Mother   . Diabetes Mother   . Arthritis Maternal Grandmother   . Diabetes Maternal Grandmother   . Arthritis Father   . CVA Father   . Hypertension Father   . Sudden death Paternal Grandfather     Social History:  reports that she has never smoked. She does not have any smokeless tobacco history on file. She reports that she does not drink alcohol or use illicit drugs.    Review of Systems    Lipid history: On Lipitor for the last few years, taking 10 mg   Lab Results  Component Value Date   CHOL 161 12/01/2014   HDL 34.80* 12/01/2014   LDLCALC 97 12/01/2014   TRIG 147.0 12/01/2014   CHOLHDL 5 12/01/2014            Eyes:  history of significant blurred vision since about 2005.  Most recent eye exam was 2013  Hypertension: None, on medications for CHF mostly    Physical Examination:  BP 130/90 mmHg  Pulse 92  Temp(Src) 98.3 F (36.8 C)  Resp 16  Ht   Wt   SpO2 93%  LMP 01/29/2015           MUSCULOSKELETAL:  she has bilateral below-knee amputations    ASSESSMENT:  Diabetes type 2, uncontrolled with obesity  Although her blood sugars are somewhat better with increasing her insulin difficult to assess her blood sugar pattern since she does not keep a record of her blood sugars and unable to download her meter today See history of present illness for detailed discussion of his current management, blood sugar patterns and problems identified  She needs to be on a GLP-1 drug because of her obesity and insulin resistance and cannot take a drug like Invokana because of renal dysfunction Since she had reported vomiting with Trulicity will have her try Tanzeum as this will have less GI side effects   PLAN:   Start Tanzeum when available through her insurance, 30 mg weekly.  Showed her how to use this pen  Needs at least 5-10 units of Humalog more active but time based on meal size  She will keep her blood sugar diary  for her blood sugars at least for the week or 2 before her visit  Low-fat diet  Discussed timing and targets of blood sugars  No change in Lantus as yet  Patient Instructions  Humalog 30-35 unit at supper based on meal size  Check blood sugars on waking up .4-6.Marland Kitchen times a week Also check blood sugars about 2 hours after a meal and do this after different meals by rotation  Recommended blood sugar levels on waking up is 90-130 and about 2 hours after meal is 140-180 Please bring blood sugar monitor to each visit.    Counseling time on subjects discussed above is over 50% of today's 25 minute visit   Tarena Gockley 02/25/2015, 2:38 PM   Note: This office note was prepared with Dragon voice recognition system technology. Any transcriptional errors that result from this process are unintentional.

## 2015-02-25 NOTE — Patient Instructions (Signed)
Humalog 30-35 unit at supper based on meal size  Check blood sugars on waking up .4-6.Marland Kitchen times a week Also check blood sugars about 2 hours after a meal and do this after different meals by rotation  Recommended blood sugar levels on waking up is 90-130 and about 2 hours after meal is 140-180 Please bring blood sugar monitor to each visit.

## 2015-03-03 ENCOUNTER — Ambulatory Visit: Payer: PRIVATE HEALTH INSURANCE | Admitting: Family Medicine

## 2015-03-05 ENCOUNTER — Encounter: Payer: Self-pay | Admitting: Physical Therapy

## 2015-03-05 ENCOUNTER — Ambulatory Visit: Payer: PRIVATE HEALTH INSURANCE | Attending: Family Medicine | Admitting: Physical Therapy

## 2015-03-05 DIAGNOSIS — M24669 Ankylosis, unspecified knee: Secondary | ICD-10-CM | POA: Diagnosis not present

## 2015-03-05 DIAGNOSIS — M25669 Stiffness of unspecified knee, not elsewhere classified: Secondary | ICD-10-CM

## 2015-03-05 DIAGNOSIS — M25659 Stiffness of unspecified hip, not elsewhere classified: Secondary | ICD-10-CM | POA: Diagnosis not present

## 2015-03-05 DIAGNOSIS — R29898 Other symptoms and signs involving the musculoskeletal system: Secondary | ICD-10-CM | POA: Diagnosis not present

## 2015-03-05 NOTE — Therapy (Signed)
[facility] [address] [place], [place], [address] Phone: [phone]   Fax:  [phone]  Physical Therapy Treatment  Patient Details  Name: Sue Martin MRN: [id number] Date of Birth: [birth year] Referring Provider:  Lucretia Kern, DO  Encounter Date: 03/05/2015      PT End of Session - 03/05/15 1036    Visit Number 5   Number of Visits 10  Medicare   Date for PT Re-Evaluation 04/27/15   PT Start Time 6301   PT Stop Time 1055   PT Time Calculation (min) 40 min   Activity Tolerance Patient tolerated treatment well   Behavior During Therapy [facility] for tasks assessed/performed      Past Medical History  Diagnosis Date  . Diabetes mellitus with renal complications     PVD and retinopathy, S/p bilat ambutations  . CHF (congestive heart failure)     sees Dr. Novella Rob  . Chronic kidney disease   . Liver cirrhosis   . Retinal detachment     sees Dr. Rose Phi per her report  . Lyme disease   . Glaucoma   . CAD (coronary artery disease) 12/12/2014    -s/p 2V PCI of the LAD/RCA with taxus stents -cardiologist is Dr. Carlis Abbott, [place]   . Depression     Past Surgical History  Procedure Laterality Date  . Leg amputation below knee  09/09/2011, 09/11/2011  . Tubal ligation    . Lasik      There were no vitals filed for this visit.  Visit Diagnosis:  Weakness of both lower extremities - Plan: PT plan of care cert/re-cert  Decreased range of motion (ROM) of knee - Plan: PT plan of care cert/re-cert  Stiffness of hip joint, unspecified laterality - Plan: PT plan of care cert/re-cert      Subjective Assessment - 03/05/15 1027    Subjective I am stiff due to moving and sitting alot yesterday.  typically the pain level is 4/10.  I feel my muscles are not as tight and hurting all the time. Her son is helping with stretching.    Pertinent History bilateral below knee ampuations   How long can you sit comfortably?  2 - 5 hours    How long can you walk comfortably? N/A    Patient Stated Goals To gain flexibility in order to decrease pain, would like to go back to prosthetics    Currently in Pain? Yes   Pain Score 7    Pain Location Leg   Pain Orientation Right;Left   Pain Descriptors / Indicators Dull;Sharp;Shooting;Aching   Pain Type Chronic pain   Pain Onset More than a month ago   Pain Frequency Constant   Aggravating Factors  sitting   Pain Relieving Factors laying down, not stretching leg            [facility] PT Assessment - 03/05/15 0001    Assessment   Medical Diagnosis Pain in joint, lower leg, unspecified laterality (M25.569)   Onset Date/Surgical Date 01/04/14   Next MD Visit August 29th    Prior Therapy Yes - right after amputation surgery    Precautions   Precautions Other (comment)  Difficulty seeing   Restrictions   Weight Bearing Restrictions Yes  NWB bilaterally    Balance Screen   Has the patient fallen in the past 6 months No   Has the patient had a decrease in activity level because of a fear of falling?  No  Is the patient reluctant to leave their home because of a fear of falling?  No   Home Environment   Living Environment Other (Comment)  Long stay hotel    Living Arrangements Spouse/significant other   Type of Home Other(Comment)   Prior Function   Level of [place] device for independence;Needs assistance with ADLs   Vocation Unemployed   Leisure Watch TV, play online    Cognition   Overall Cognitive Status Within Functional Limits for tasks assessed   Observation/Other Assessments   Focus on Therapeutic Outcomes (FOTO)  66% limitation   ROM / Strength   AROM / PROM / Strength AROM;PROM;Strength   AROM   Overall AROM  Deficits   Right Knee Extension -45   Left Knee Extension -45   PROM   Overall PROM  Deficits   Strength   Overall Strength Deficits   Strength Assessment Site Hip;Knee   Right/Left Hip Left;Right   Right Hip  Flexion 3/5   Left Hip Flexion 3/5  Limited strength due to pain    Right/Left Knee Right;Left   Right Knee Extension 3-/5  Unable to perform full knee ROM    Left Knee Extension 2+/5  Limited by pain    Flexibility   Soft Tissue Assessment /Muscle Length yes   Hamstrings SLR: Rt. limited by 40% to the Lt.                     [facility] PT Treatment/Exercise - 03/05/15 0001    Lumbar Exercises: Stretches   Single Knee to Chest Stretch 5 reps;30 seconds   Single Knee to Chest Stretch Limitations Needsa assistance with towel to get into position   Lumbar Exercises: Supine   Straight Leg Raise 20 reps   Other Supine Lumbar Exercises Hip IR stretch 3 x20 sec    Knee/Hip Exercises: Stretches   Other Knee/Hip Stretches prolonged stretch into extension.  End of legs placed on blue pad 4x1 minute hold with verbal cues to keep hips neutral.    Knee/Hip Exercises: Sidelying   Hip ABduction 2 sets;10 reps;Both  verbal cues for not flexing hip   Manual Therapy   Manual Therapy Passive ROM   Manual therapy comments PROM and prolonged hold (gentle) into knee extension                PT Education - 03/05/15 1037    Education provided No          PT Short Term Goals - 03/05/15 1030    PT SHORT TERM GOAL #1   Title Be independent with home exercise program    Time 4   Period Weeks   Status Achieved   PT SHORT TERM GOAL #2   Title Decrease bil. knee pain by 25% with wheelchair <> bed transfers due to improved knee flexiblity    Time 4   Period Weeks   Status Achieved           PT Long Term Goals - 03/05/15 1031    PT LONG TERM GOAL #1   Title Be independent with advanced HEP.    Time 8   Period Weeks   Status On-going  still learning   PT LONG TERM GOAL #2   Title Improve hip flexion and knee extension strength to 4-/5 bilaterally to improve ease with mobility and transfers   Time 8   Period Weeks   Status On-going   PT LONG TERM GOAL #3  Title  decrease bilateral knee pain by 40% with transfers due to improved flexiblity   Time 8   Period Weeks   Status On-going  25% better.    PT LONG TERM GOAL #4   Title Caregiver verbalizes understanding of how to assist with LE stretching techniques at home    Time 8   Period Weeks   Status Achieved   PT LONG TERM GOAL #5   Title Reduce FOTO limitation to < or = to 47%    Time 8   Period Weeks   Status On-going               Plan - 03/05/15 1037    Clinical Impression Statement Patient is a 49 year old female with diagnosis of pain in joint, LE unspecified laterality. Patient has met all of her STG's. Patient had to miss several appointments due to no transportation.  Patient now has [facility] scheduled to get to her appointments. Patient knee extension bil. PROM -40 degrees.  Patient continues to have decreased strength in bil. LE.  Patient has pain in her knees if she is in a chair for too long.  Patient reports increased  flexibility in legs. Patiwtn has help at home to perform her exercises. Patient will benefit fromp hysical therapy to improve flexibility and strength.    Pt will benefit from skilled therapeutic intervention in order to improve on the following deficits Decreased range of motion;Obesity;Impaired vision/preception;Pain;Impaired flexibility;Decreased strength   Rehab Potential Good   Clinical Impairments Affecting Rehab Potential Bil. BKA   PT Frequency 2x / week   PT Duration 8 weeks   PT Treatment/Interventions ADLs/Self Care Home Management;Therapeutic activities;Therapeutic exercise;Prosthetic Training;Cryotherapy;Electrical Stimulation;Neuromuscular re-education;Patient/family education;Passive range of motion;Compression bandaging;Energy conservation   PT Next Visit Plan PROM into knee extension, Hip strength   PT Home Exercise Plan progress as needed   Recommended Other Services None   Consulted and Agree with Plan of Care Patient        Problem  List Patient Active Problem List   Diagnosis Date Noted  . Acute on chronic systolic heart failure 88/04/314  . Type II diabetes mellitus with ophthalmic manifestations, uncontrolled 12/22/2014  . Hyperlipidemia 12/22/2014  . CAD (coronary artery disease) 12/12/2014  . Diabetic retinopathy 12/12/2014  . PVD (peripheral vascular disease) 12/12/2014  . CKD (chronic kidney disease) stage 3, GFR 30-59 ml/min 12/12/2014  . Type 2 diabetes mellitus with diabetic nephropathy 11/28/2014  . Chronic systolic congestive heart failure 11/28/2014  . Cirrhosis 11/28/2014  . Generalized OA 11/28/2014  . S/P bilateral BKA (below knee amputation) 11/28/2014    GRAY,CHERYL,PT 03/05/2015, 10:59 AM  [facility] [address] [place], [place], [address] Phone: [phone]   Fax:  [phone]

## 2015-03-09 ENCOUNTER — Ambulatory Visit: Payer: PRIVATE HEALTH INSURANCE | Admitting: Physical Therapy

## 2015-03-09 ENCOUNTER — Encounter: Payer: Self-pay | Admitting: Physical Therapy

## 2015-03-09 DIAGNOSIS — M24669 Ankylosis, unspecified knee: Secondary | ICD-10-CM | POA: Diagnosis not present

## 2015-03-09 DIAGNOSIS — M25659 Stiffness of unspecified hip, not elsewhere classified: Secondary | ICD-10-CM

## 2015-03-09 DIAGNOSIS — R29898 Other symptoms and signs involving the musculoskeletal system: Secondary | ICD-10-CM | POA: Diagnosis not present

## 2015-03-09 DIAGNOSIS — M25669 Stiffness of unspecified knee, not elsewhere classified: Secondary | ICD-10-CM

## 2015-03-09 NOTE — Therapy (Signed)
[facility] [address], [address] [place], [place], [address] Phone: [phone]   Fax:  [phone]  Physical Therapy Treatment  Patient Details  Name: Sue Martin MRN: [id number] Date of Birth: [birth year] Referring Provider:  Terressa Koyanagi, DO  Encounter Date: 03/09/2015      PT End of Session - 03/09/15 1055    Visit Number 6   Number of Visits 10  Medicare   Date for PT Re-Evaluation 04/27/15   PT Start Time 1015   PT Stop Time 1056   PT Time Calculation (min) 41 min   Activity Tolerance Patient tolerated treatment well   Behavior During Therapy [facility] for tasks assessed/performed      Past Medical History  Diagnosis Date  . Diabetes mellitus with renal complications     PVD and retinopathy, S/p bilat ambutations  . CHF (congestive heart failure)     sees Dr. Wynonia Hazard  . Chronic kidney disease   . Liver cirrhosis   . Retinal detachment     sees Dr. Johna Sheriff per her report  . Lyme disease   . Glaucoma   . CAD (coronary artery disease) 12/12/2014    -s/p 2V PCI of the LAD/RCA with taxus stents -cardiologist is Dr. Carmon Sails, Celedonio Savage   . Depression     Past Surgical History  Procedure Laterality Date  . Leg amputation below knee  09/09/2011, 09/11/2011  . Tubal ligation    . Lasik      There were no vitals filed for this visit.  Visit Diagnosis:  Weakness of both lower extremities  Decreased range of motion (ROM) of knee  Stiffness of hip joint, unspecified laterality      Subjective Assessment - 03/09/15 1020    Subjective My right side is still still.  The more I stretch the more charlie horses I get.    Pertinent History bilateral below knee ampuations   How long can you sit comfortably? 2 - 5 hours    How long can you stand comfortably? N/A   How long can you walk comfortably? N/A    Patient Stated Goals To gain flexibility in order to decrease pain, would like to go back to prosthetics    Currently in Pain? Yes   Pain Score 4   right knee is 8/10   Pain Location Leg   Pain Orientation Right;Left   Pain Descriptors / Indicators Dull;Sharp;Shooting;Aching   Pain Type Chronic pain   Pain Onset More than a month ago   Aggravating Factors  sitting, charlie horses   Pain Relieving Factors laying down and stretching, massage   Multiple Pain Sites No                         [facility] Adult PT Treatment/Exercise - 03/09/15 0001    Lumbar Exercises: Stretches   Single Knee to Chest Stretch 3 reps;30 seconds  therapist guiding hip into IR   Single Knee to Chest Stretch Limitations Needsa assistance with towel to get into position   Lumbar Exercises: Supine   Straight Leg Raise 20 reps  VC to prevent abduction of hip   Knee/Hip Exercises: Stretches   Hip Flexor Stretch Left;Right;30 seconds;3 reps  therapist pulling leg in sidely   Other Knee/Hip Stretches hip abduction hole 30 sec. 2 times each leg   Knee/Hip Exercises: Sidelying   Hip ABduction Strengthening;Right;Left;3 sets;10 reps   Manual Therapy   Manual Therapy Passive ROM  Manual therapy comments PROM and prolonged hold (gentle) into knee extension                PT Education - 03/09/15 1055    Education provided No          PT Short Term Goals - 03/05/15 1030    PT SHORT TERM GOAL #1   Title Be independent with home exercise program    Time 4   Period Weeks   Status Achieved   PT SHORT TERM GOAL #2   Title Decrease bil. knee pain by 25% with wheelchair <> bed transfers due to improved knee flexiblity    Time 4   Period Weeks   Status Achieved           PT Long Term Goals - 03/09/15 1056    PT LONG TERM GOAL #1   Title Be independent with advanced HEP.    Time 8   Period Weeks   Status On-going  still learning   PT LONG TERM GOAL #2   Title Improve hip flexion and knee extension strength to 4-/5 bilaterally to improve ease with mobility and transfers   Time 8    Period Weeks   Status On-going   PT LONG TERM GOAL #3   Title decrease bilateral knee pain by 40% with transfers due to improved flexiblity   Time 8   Period Weeks   Status On-going  25% better   PT LONG TERM GOAL #4   Title Caregiver verbalizes understanding of how to assist with LE stretching techniques at home    Time 8   Period Weeks   Status Achieved   PT LONG TERM GOAL #5   Title Reduce FOTO limitation to < or = to 47%    Time 8   Period Weeks   Status On-going               Plan - 03/09/15 1058    Clinical Impression Statement Patient is a 49 year old female with diagnosis of pain in joint, LE unspecified laterality.  Patient has increased pain in right knee due to Charlie horses from stretching her knees.  Patient is able to do more repititions for her exercises.  Patient has tight hip flexors. Patient uses [employer] for transportation. No changes in knee ROM. Patient is able to get in and out of the chair without assistance.  When patient lays on mat she needs a wedge with pillows and unable to lay on the seem. Patieint would benefit from physical therapy to improve strength and flexibility.    Pt will benefit from skilled therapeutic intervention in order to improve on the following deficits Decreased range of motion;Obesity;Impaired vision/preception;Pain;Impaired flexibility;Decreased strength   Rehab Potential Good   Clinical Impairments Affecting Rehab Potential Bil. BKA   PT Frequency 2x / week   PT Duration 8 weeks   PT Next Visit Plan PROM into knee extension, Hip strength   PT Home Exercise Plan progress as needed   Consulted and Agree with Plan of Care Patient        Problem List Patient Active Problem List   Diagnosis Date Noted  . Acute on chronic systolic heart failure 02/21/2015  . Type II diabetes mellitus with ophthalmic manifestations, uncontrolled 12/22/2014  . Hyperlipidemia 12/22/2014  . CAD (coronary artery disease) 12/12/2014  . Diabetic  retinopathy 12/12/2014  . PVD (peripheral vascular disease) 12/12/2014  . CKD (chronic kidney disease) stage 3, GFR 30-59 ml/min 12/12/2014  .  Type 2 diabetes mellitus with diabetic nephropathy 11/28/2014  . Chronic systolic congestive heart failure 11/28/2014  . Cirrhosis 11/28/2014  . Generalized OA 11/28/2014  . S/P bilateral BKA (below knee amputation) 11/28/2014    Jyra Lagares ,PT  03/09/2015, 11:08 AM  [facility] [address], [address] [place], [place], [address] Phone: [phone]   Fax:  [phone]

## 2015-03-12 ENCOUNTER — Ambulatory Visit: Payer: PRIVATE HEALTH INSURANCE | Admitting: Physical Therapy

## 2015-03-12 ENCOUNTER — Encounter: Payer: Self-pay | Admitting: Physical Therapy

## 2015-03-12 DIAGNOSIS — M25659 Stiffness of unspecified hip, not elsewhere classified: Secondary | ICD-10-CM

## 2015-03-12 DIAGNOSIS — R29898 Other symptoms and signs involving the musculoskeletal system: Secondary | ICD-10-CM

## 2015-03-12 DIAGNOSIS — M25669 Stiffness of unspecified knee, not elsewhere classified: Secondary | ICD-10-CM

## 2015-03-12 DIAGNOSIS — M24669 Ankylosis, unspecified knee: Secondary | ICD-10-CM | POA: Diagnosis not present

## 2015-03-12 NOTE — Therapy (Signed)
[facility] [address], [address] [place], [place], [address] Phone: [phone]   Fax:  [phone]  Physical Therapy Treatment  Patient Details  Name: Sue Martin MRN: [id number] Date of Birth: 16-Nov-1965 Referring Provider:  Terressa Koyanagi, DO  Encounter Date: 03/12/2015      PT End of Session - 03/12/15 1051    Visit Number 7   Number of Visits 10  Medicare   Date for PT Re-Evaluation 04/27/15   PT Start Time 1015   PT Stop Time 1055   PT Time Calculation (min) 40 min   Activity Tolerance Patient tolerated treatment well   Behavior During Therapy [facility] for tasks assessed/performed      Past Medical History  Diagnosis Date  . Diabetes mellitus with renal complications     PVD and retinopathy, S/p bilat ambutations  . CHF (congestive heart failure)     sees Dr. Wynonia Hazard  . Chronic kidney disease   . Liver cirrhosis   . Retinal detachment     sees Dr. Johna Sheriff per her report  . Lyme disease   . Glaucoma   . CAD (coronary artery disease) 12/12/2014    -s/p 2V PCI of the LAD/RCA with taxus stents -cardiologist is Dr. Carmon Sails, Celedonio Savage   . Depression     Past Surgical History  Procedure Laterality Date  . Leg amputation below knee  09/09/2011, 09/11/2011  . Tubal ligation    . Lasik      There were no vitals filed for this visit.  Visit Diagnosis:  Weakness of both lower extremities  Decreased range of motion (ROM) of knee  Stiffness of hip joint, unspecified laterality      Subjective Assessment - 03/12/15 1021    Subjective I have to see the cardiologist on Monday due to bad blood work. I am to take it easy.    Pertinent History bilateral below knee ampuations   How long can you sit comfortably? 2 - 5 hours    How long can you stand comfortably? N/A   How long can you walk comfortably? N/A    Patient Stated Goals To gain flexibility in order to decrease pain, would like to go back to prosthetics     Currently in Pain? Yes   Pain Score 4    Pain Location Leg  Back   Pain Orientation Right;Left;Lower   Pain Descriptors / Indicators Dull;Sharp;Shooting;Aching   Pain Type Chronic pain   Pain Onset More than a month ago   Pain Frequency Constant   Aggravating Factors  sitting   Pain Relieving Factors laying down and stretching, massage   Multiple Pain Sites No                         [facility] Adult PT Treatment/Exercise - 03/12/15 0001    Lumbar Exercises: Stretches   Single Knee to Chest Stretch 3 reps;30 seconds  therapist guiding hip into IR   Single Knee to Chest Stretch Limitations Needs a assistance with towel to get into position   Lumbar Exercises: Supine   Straight Leg Raise 20 reps  VC to prevent abduction of hip   Other Supine Lumbar Exercises Hip IR stretch 3 x20 sec    Knee/Hip Exercises: Supine   Short Arc Quad Sets AROM;Strengthening;Right;Left;3 sets;10 reps  small foam roll under knees   Other Supine Knee/Hip Exercises supine hip abduction 10x3 bil.  PT Education - 03/12/15 1051    Education provided No          PT Short Term Goals - 03/05/15 1030    PT SHORT TERM GOAL #1   Title Be independent with home exercise program    Time 4   Period Weeks   Status Achieved   PT SHORT TERM GOAL #2   Title Decrease bil. knee pain by 25% with wheelchair <> bed transfers due to improved knee flexiblity    Time 4   Period Weeks   Status Achieved           PT Long Term Goals - 03/09/15 1056    PT LONG TERM GOAL #1   Title Be independent with advanced HEP.    Time 8   Period Weeks   Status On-going  still learning   PT LONG TERM GOAL #2   Title Improve hip flexion and knee extension strength to 4-/5 bilaterally to improve ease with mobility and transfers   Time 8   Period Weeks   Status On-going   PT LONG TERM GOAL #3   Title decrease bilateral knee pain by 40% with transfers due to improved flexiblity   Time  8   Period Weeks   Status On-going  25% better   PT LONG TERM GOAL #4   Title Caregiver verbalizes understanding of how to assist with LE stretching techniques at home    Time 8   Period Weeks   Status Achieved   PT LONG TERM GOAL #5   Title Reduce FOTO limitation to < or = to 47%    Time 8   Period Weeks   Status On-going               Plan - 03/12/15 1052    Clinical Impression Statement Patient is a 49 year old female with diagnosis of pain in joint, LE unspecified laterality.  Patient is not able to push herself to exercise due to her cardiologist wants her to take it easy until he sees her on Monday.  Patient did have some pressure in her left chest with knees to chest exercise.  Patient would benefit from physical therapy to imporve flexibility and decrease pain.    Pt will benefit from skilled therapeutic intervention in order to improve on the following deficits Decreased range of motion;Obesity;Impaired vision/preception;Pain;Impaired flexibility;Decreased strength   Rehab Potential Good   Clinical Impairments Affecting Rehab Potential Bil. BKA   PT Frequency 2x / week   PT Duration 8 weeks   PT Treatment/Interventions ADLs/Self Care Home Management;Therapeutic activities;Therapeutic exercise;Prosthetic Training;Cryotherapy;Electrical Stimulation;Neuromuscular re-education;Patient/family education;Passive range of motion;Compression bandaging;Energy conservation   PT Next Visit Plan PROM into knee extension, Hip strength; see how cardiologist appt. went   PT Home Exercise Plan progress as needed   Consulted and Agree with Plan of Care Patient        Problem List Patient Active Problem List   Diagnosis Date Noted  . Acute on chronic systolic heart failure 02/21/2015  . Type II diabetes mellitus with ophthalmic manifestations, uncontrolled 12/22/2014  . Hyperlipidemia 12/22/2014  . CAD (coronary artery disease) 12/12/2014  . Diabetic retinopathy 12/12/2014  .  PVD (peripheral vascular disease) 12/12/2014  . CKD (chronic kidney disease) stage 3, GFR 30-59 ml/min 12/12/2014  . Type 2 diabetes mellitus with diabetic nephropathy 11/28/2014  . Chronic systolic congestive heart failure 11/28/2014  . Cirrhosis 11/28/2014  . Generalized OA 11/28/2014  . S/P bilateral BKA (below knee amputation)  11/28/2014    Trenita Hulme,PT 03/12/2015, 10:55 AM  [facility] [address], [address] [place], [place], [address] Phone: [phone]   Fax:  [phone]

## 2015-03-16 ENCOUNTER — Encounter: Payer: PRIVATE HEALTH INSURANCE | Admitting: Physical Therapy

## 2015-03-16 DIAGNOSIS — I10 Essential (primary) hypertension: Secondary | ICD-10-CM | POA: Diagnosis not present

## 2015-03-16 DIAGNOSIS — I5022 Chronic systolic (congestive) heart failure: Secondary | ICD-10-CM | POA: Diagnosis not present

## 2015-03-16 DIAGNOSIS — E785 Hyperlipidemia, unspecified: Secondary | ICD-10-CM | POA: Diagnosis not present

## 2015-03-16 DIAGNOSIS — I255 Ischemic cardiomyopathy: Secondary | ICD-10-CM | POA: Diagnosis not present

## 2015-03-16 DIAGNOSIS — I2510 Atherosclerotic heart disease of native coronary artery without angina pectoris: Secondary | ICD-10-CM | POA: Diagnosis not present

## 2015-03-18 ENCOUNTER — Encounter: Payer: Self-pay | Admitting: Physical Therapy

## 2015-03-18 ENCOUNTER — Ambulatory Visit: Payer: PRIVATE HEALTH INSURANCE | Admitting: Physical Therapy

## 2015-03-18 DIAGNOSIS — R29898 Other symptoms and signs involving the musculoskeletal system: Secondary | ICD-10-CM | POA: Diagnosis not present

## 2015-03-18 DIAGNOSIS — M25659 Stiffness of unspecified hip, not elsewhere classified: Secondary | ICD-10-CM | POA: Diagnosis not present

## 2015-03-18 DIAGNOSIS — M25669 Stiffness of unspecified knee, not elsewhere classified: Secondary | ICD-10-CM

## 2015-03-18 DIAGNOSIS — M24669 Ankylosis, unspecified knee: Secondary | ICD-10-CM | POA: Diagnosis not present

## 2015-03-18 NOTE — Therapy (Signed)
[facility] [address], [address] [place], [place], [address] Phone: [phone]   Fax:  [phone]  Physical Therapy Treatment  Patient Details  Name: Sue Martin MRN: [id number] Date of Birth: [birth year] Referring Provider:  Terressa Koyanagi, DO  Encounter Date: 03/18/2015      PT End of Session - 03/18/15 1040    Visit Number 8   Number of Visits 10   Date for PT Re-Evaluation 04/27/15   PT Start Time 1015   PT Stop Time 1055   PT Time Calculation (min) 40 min   Activity Tolerance Patient tolerated treatment well   Behavior During Therapy [facility] for tasks assessed/performed      Past Medical History  Diagnosis Date  . Diabetes mellitus with renal complications     PVD and retinopathy, S/p bilat ambutations  . CHF (congestive heart failure)     sees Dr. Wynonia Hazard  . Chronic kidney disease   . Liver cirrhosis   . Retinal detachment     sees Dr. Johna Sheriff per her report  . Lyme disease   . Glaucoma   . CAD (coronary artery disease) 12/12/2014    -s/p 2V PCI of the LAD/RCA with taxus stents -cardiologist is Dr. Carmon Sails, [place]   . Depression     Past Surgical History  Procedure Laterality Date  . Leg amputation below knee  09/09/2011, 09/11/2011  . Tubal ligation    . Lasik      There were no vitals filed for this visit.  Visit Diagnosis:  Weakness of both lower extremities  Decreased range of motion (ROM) of knee  Stiffness of hip joint, unspecified laterality      Subjective Assessment - 03/18/15 1018    Subjective Cardio MD ordered "sonogram" for heart to look at "what her heart is pumping out." I just need longer rest breaks in betweem my exercises. Sonogram on the 30th.    Currently in Pain? Yes   Pain Score 7    Pain Location --  All joints   Aggravating Factors  Weather related perhaps per pt.   Pain Relieving Factors Rest, laying down   Multiple Pain Sites No                          [facility] Adult PT Treatment/Exercise - 03/18/15 0001    Lumbar Exercises: Stretches   Single Knee to Chest Stretch 3 reps  VC to releax chest muscles with breathing, 3x each side   Single Knee to Chest Stretch Limitations Needs a assistance with towel to get into position   Knee/Hip Exercises: Supine   Short Arc Quad Sets Strengthening;Both;3 sets  3x10, VC to hold 2-3 sec   Other Supine Knee/Hip Exercises Hip abduction 3x5 bil                   PT Short Term Goals - 03/05/15 1030    PT SHORT TERM GOAL #1   Title Be independent with home exercise program    Time 4   Period Weeks   Status Achieved   PT SHORT TERM GOAL #2   Title Decrease bil. knee pain by 25% with wheelchair <> bed transfers due to improved knee flexiblity    Time 4   Period Weeks   Status Achieved           PT Long Term Goals - 03/18/15 1058    PT LONG TERM GOAL #1  Title Be independent with advanced HEP.    Time 8   Period Weeks   Status On-going   PT LONG TERM GOAL #3   Title decrease bilateral knee pain by 40% with transfers due to improved flexiblity   Time 8   Period Weeks   Status On-going  A  lot of pain this week due to weather   PT LONG TERM GOAL #4   Title Caregiver verbalizes understanding of how to assist with LE stretching techniques at home    Time 8   Period Weeks   Status Achieved               Plan - 03/18/15 1043    Clinical Impression Statement Pt having a lot of increased pain today. She reports this is due to the weather as all the joints in her body hurt. We adjusted exercises to hold shorter with more rest breaks due to her chest issues.    Pt will benefit from skilled therapeutic intervention in order to improve on the following deficits Decreased range of motion;Obesity;Impaired vision/preception;Pain;Impaired flexibility;Decreased strength   Rehab Potential Good   Clinical Impairments Affecting Rehab Potential Bil.  BKA   PT Frequency 2x / week   PT Duration 8 weeks   PT Treatment/Interventions ADLs/Self Care Home Management;Therapeutic activities;Therapeutic exercise;Prosthetic Training;Cryotherapy;Electrical Stimulation;Neuromuscular re-education;Patient/family education;Passive range of motion;Compression bandaging;Energy conservation   PT Next Visit Plan PROM into knee extension, Hip strength   Consulted and Agree with Plan of Care Patient        Problem List Patient Active Problem List   Diagnosis Date Noted  . Acute on chronic systolic heart failure 02/21/2015  . Type II diabetes mellitus with ophthalmic manifestations, uncontrolled 12/22/2014  . Hyperlipidemia 12/22/2014  . CAD (coronary artery disease) 12/12/2014  . Diabetic retinopathy 12/12/2014  . PVD (peripheral vascular disease) 12/12/2014  . CKD (chronic kidney disease) stage 3, GFR 30-59 ml/min 12/12/2014  . Type 2 diabetes mellitus with diabetic nephropathy 11/28/2014  . Chronic systolic congestive heart failure 11/28/2014  . Cirrhosis 11/28/2014  . Generalized OA 11/28/2014  . S/P bilateral BKA (below knee amputation) 11/28/2014    Litha Lamartina, PTA 03/18/2015, 10:59 AM  [facility] [address] [place], [place], [address] Phone: [phone]   Fax:  [phone]

## 2015-03-19 ENCOUNTER — Ambulatory Visit: Payer: PRIVATE HEALTH INSURANCE | Admitting: Physical Therapy

## 2015-03-19 ENCOUNTER — Encounter: Payer: Self-pay | Admitting: Physical Therapy

## 2015-03-19 DIAGNOSIS — M25659 Stiffness of unspecified hip, not elsewhere classified: Secondary | ICD-10-CM | POA: Diagnosis not present

## 2015-03-19 DIAGNOSIS — R29898 Other symptoms and signs involving the musculoskeletal system: Secondary | ICD-10-CM

## 2015-03-19 DIAGNOSIS — M24669 Ankylosis, unspecified knee: Secondary | ICD-10-CM | POA: Diagnosis not present

## 2015-03-19 DIAGNOSIS — M25669 Stiffness of unspecified knee, not elsewhere classified: Secondary | ICD-10-CM

## 2015-03-19 NOTE — Therapy (Signed)
[facility] [address] [place], [place], [address] Phone: [phone]   Fax:  [phone]  Physical Therapy Treatment  Patient Details  Name: Sue Martin MRN: [id number] Date of Birth: [birth year] Referring Provider:  Terressa Koyanagi, DO  Encounter Date: 03/19/2015      PT End of Session - 03/19/15 1021    Visit Number 9   Number of Visits 10  Medicare   Date for PT Re-Evaluation 04/27/15   PT Start Time 1020   PT Stop Time 1058   PT Time Calculation (min) 38 min   Activity Tolerance Patient tolerated treatment well   Behavior During Therapy [facility] for tasks assessed/performed      Past Medical History  Diagnosis Date  . Diabetes mellitus with renal complications     PVD and retinopathy, S/p bilat ambutations  . CHF (congestive heart failure)     sees Dr. Wynonia Hazard  . Chronic kidney disease   . Liver cirrhosis   . Retinal detachment     sees Dr. Johna Sheriff per her report  . Lyme disease   . Glaucoma   . CAD (coronary artery disease) 12/12/2014    -s/p 2V PCI of the LAD/RCA with taxus stents -cardiologist is Dr. Carmon Sails, [place] Savage   . Depression     Past Surgical History  Procedure Laterality Date  . Leg amputation below knee  09/09/2011, 09/11/2011  . Tubal ligation    . Lasik      There were no vitals filed for this visit.  Visit Diagnosis:  Weakness of both lower extremities  Decreased range of motion (ROM) of knee  Stiffness of hip joint, unspecified laterality      Subjective Assessment - 03/19/15 1023    Subjective MD put me on a new blood pressure medication.  I see the cardiologist on 03/27/2015.  I will be having a echo.    Pertinent History bilateral below knee ampuations   How long can you sit comfortably? 2 - 5 hours    How long can you stand comfortably? N/A   How long can you walk comfortably? N/A    Patient Stated Goals To gain flexibility in order to decrease pain, would like to  go back to prosthetics    Currently in Pain? Yes   Pain Score 7    Pain Location --  all joints   Pain Orientation Right;Left;Lower   Pain Descriptors / Indicators Dull;Sharp;Shooting;Aching   Pain Type Chronic pain   Pain Onset More than a month ago   Pain Frequency Constant   Aggravating Factors  weather related   Pain Relieving Factors rest laying down   Multiple Pain Sites No                         [facility] Adult PT Treatment/Exercise - 03/19/15 0001    Lumbar Exercises: Stretches   Single Knee to Chest Stretch 3 reps  VC to releax chest muscles with breathing, 3x each side   Single Knee to Chest Stretch Limitations Needs a assistance with towel to get into position   Lumbar Exercises: Supine   Straight Leg Raise 20 reps  VC to prevent abduction of hip   Other Supine Lumbar Exercises hip abduction 10 x3    Other Supine Lumbar Exercises Hip IR stretch 3 x20 sec    Knee/Hip Exercises: Supine   Short Arc Quad Sets Strengthening;Both;3 sets  3x10, VC to hold 5 sec,  small roll under knees   Manual Therapy   Manual Therapy Passive ROM   Manual therapy comments PROM and prolonged hold (gentle) into knee extension                PT Education - 03/19/15 1057    Education provided No          PT Short Term Goals - 03/05/15 1030    PT SHORT TERM GOAL #1   Title Be independent with home exercise program    Time 4   Period Weeks   Status Achieved   PT SHORT TERM GOAL #2   Title Decrease bil. knee pain by 25% with wheelchair <> bed transfers due to improved knee flexiblity    Time 4   Period Weeks   Status Achieved           PT Long Term Goals - 03/18/15 1058    PT LONG TERM GOAL #1   Title Be independent with advanced HEP.    Time 8   Period Weeks   Status On-going   PT LONG TERM GOAL #3   Title decrease bilateral knee pain by 40% with transfers due to improved flexiblity   Time 8   Period Weeks   Status On-going  A  lot of pain this  week due to weather   PT LONG TERM GOAL #4   Title Caregiver verbalizes understanding of how to assist with LE stretching techniques at home    Time 8   Period Weeks   Status Achieved               Plan - 03/19/15 1057    Clinical Impression Statement Patient needed many rests due to fatique and catching her breath due to her heart condition.  Patient works hard in therapy and does all of the exercise.  Patient needs to increase endurancd.    Pt will benefit from skilled therapeutic intervention in order to improve on the following deficits Decreased range of motion;Obesity;Impaired vision/preception;Pain;Impaired flexibility;Decreased strength   Rehab Potential Good   Clinical Impairments Affecting Rehab Potential Bil. BKA   PT Frequency 2x / week   PT Duration 8 weeks   PT Treatment/Interventions ADLs/Self Care Home Management;Therapeutic activities;Therapeutic exercise;Prosthetic Training;Cryotherapy;Electrical Stimulation;Neuromuscular re-education;Patient/family education;Passive range of motion;Compression bandaging;Energy conservation   PT Next Visit Plan Needs a G-code, progress note   PT Home Exercise Plan progress as needed   Consulted and Agree with Plan of Care Patient        Problem List Patient Active Problem List   Diagnosis Date Noted  . Acute on chronic systolic heart failure 02/21/2015  . Type II diabetes mellitus with ophthalmic manifestations, uncontrolled 12/22/2014  . Hyperlipidemia 12/22/2014  . CAD (coronary artery disease) 12/12/2014  . Diabetic retinopathy 12/12/2014  . PVD (peripheral vascular disease) 12/12/2014  . CKD (chronic kidney disease) stage 3, GFR 30-59 ml/min 12/12/2014  . Type 2 diabetes mellitus with diabetic nephropathy 11/28/2014  . Chronic systolic congestive heart failure 11/28/2014  . Cirrhosis 11/28/2014  . Generalized OA 11/28/2014  . S/P bilateral BKA (below knee amputation) 11/28/2014    GRAY,CHERYL,PT 03/19/2015, 10:59  AM  [facility] [address] [place], [place], [address] Phone: [phone]   Fax:  [phone]

## 2015-03-23 ENCOUNTER — Ambulatory Visit: Payer: PRIVATE HEALTH INSURANCE

## 2015-03-23 DIAGNOSIS — M25659 Stiffness of unspecified hip, not elsewhere classified: Secondary | ICD-10-CM

## 2015-03-23 DIAGNOSIS — R29898 Other symptoms and signs involving the musculoskeletal system: Secondary | ICD-10-CM

## 2015-03-23 DIAGNOSIS — M24669 Ankylosis, unspecified knee: Secondary | ICD-10-CM | POA: Diagnosis not present

## 2015-03-23 DIAGNOSIS — M25669 Stiffness of unspecified knee, not elsewhere classified: Secondary | ICD-10-CM

## 2015-03-23 NOTE — Therapy (Signed)
[facility] [address], [address] [place], [place], [address] Phone: [phone]   Fax:  [phone]  Physical Therapy Treatment  Patient Details  Name: Sue Martin MRN: [id number] Date of Birth: 02-02-1966 Referring Provider:  Terressa Koyanagi, DO  Encounter Date: 03/23/2015      PT End of Session - 03/23/15 1016    Visit Number 10   Number of Visits 20   Date for PT Re-Evaluation 04/27/15   PT Start Time 0939   PT Stop Time 1016   PT Time Calculation (min) 37 min   Activity Tolerance Patient tolerated treatment well   Behavior During Therapy [facility] for tasks assessed/performed      Past Medical History  Diagnosis Date  . Diabetes mellitus with renal complications     PVD and retinopathy, S/p bilat ambutations  . CHF (congestive heart failure)     sees Dr. Wynonia Hazard  . Chronic kidney disease   . Liver cirrhosis   . Retinal detachment     sees Dr. Johna Sheriff per her report  . Lyme disease   . Glaucoma   . CAD (coronary artery disease) 12/12/2014    -s/p 2V PCI of the LAD/RCA with taxus stents -cardiologist is Dr. Carmon Sails, [place]eledonio Savage   . Depression     Past Surgical History  Procedure Laterality Date  . Leg amputation below knee  09/09/2011, 09/11/2011  . Tubal ligation    . Lasik      There were no vitals filed for this visit.  Visit Diagnosis:  Weakness of both lower extremities  Decreased range of motion (ROM) of knee  Stiffness of hip joint, unspecified laterality      Subjective Assessment - 03/23/15 0943    Subjective Pt has been taking it easier due to heart issues.     Currently in Pain? Yes   Pain Score 5    Pain Location --  all over   Pain Orientation Right;Left;Lower   Pain Descriptors / Indicators Aching   Pain Type Chronic pain   Pain Onset More than a month ago   Pain Frequency Constant   Aggravating Factors  weather related   Pain Relieving Factors rest, laying down             [name] PT Assessment - 03/23/15 0001    Assessment   Medical Diagnosis Pain in joint, lower leg, unspecified laterality (M25.569)   Onset Date/Surgical Date 01/04/14   Next MD Visit August 29th    Prior Therapy Yes - right after amputation surgery    Observation/Other Assessments   Focus on Therapeutic Outcomes (FOTO)  70% limitation   ROM / Strength   AROM / PROM / Strength AROM;PROM   AROM   Overall AROM  Deficits   Right Knee Extension -41   Left Knee Extension -40   Strength   Overall Strength Deficits   Strength Assessment Site Hip;Knee   Right/Left Hip Right;Left   Right Hip Flexion 4-/5   Left Hip Flexion 3+/5                     [facility] Adult PT Treatment/Exercise - 03/23/15 0001    Lumbar Exercises: Stretches   Single Knee to Chest Stretch 3 reps  VC to releax chest muscles with breathing, 3x each side   Single Knee to Chest Stretch Limitations Needs a assistance with towel to get into position   Lumbar Exercises: Supine   Straight Leg Raise 20  reps  VC to prevent abduction of hip   Other Supine Lumbar Exercises hip abduction 10 x3   perfomed supine   Other Supine Lumbar Exercises Hip IR stretch 3 x20 sec    Knee/Hip Exercises: Supine   Short Arc Quad Sets Strengthening;Both;3 sets  3x10, VC to hold 5 sec, small roll under knees   Manual Therapy   Manual Therapy Passive ROM   Manual therapy comments PROM and prolonged hold (gentle) into knee extension                  PT Short Term Goals - 03/05/15 1030    PT SHORT TERM GOAL #1   Title Be independent with home exercise program    Time 4   Period Weeks   Status Achieved   PT SHORT TERM GOAL #2   Title Decrease bil. knee pain by 25% with wheelchair <> bed transfers due to improved knee flexiblity    Time 4   Period Weeks   Status Achieved           PT Long Term Goals - Apr 15, 2015 1001    PT LONG TERM GOAL #1   Title Be independent with advanced HEP.    Time 8   Period Weeks    Status On-going   PT LONG TERM GOAL #2   Title Improve hip flexion and knee extension strength to 4-/5 bilaterally to improve ease with mobility and transfers   Time 8   Period Weeks   Status On-going  see MMT   PT LONG TERM GOAL #3   Title decrease bilateral knee pain by 40% with transfers due to improved flexiblity   Time 8   Period Weeks   Status On-going  25% reduction in pain   PT LONG TERM GOAL #4   Title Caregiver verbalizes understanding of how to assist with LE stretching techniques at home    Status Achieved   PT LONG TERM GOAL #5   Title Reduce FOTO limitation to < or = to 47%    Time 8   Period Weeks   Status On-going  70% limitation               Plan - 2015-04-15 0956    Clinical Impression Statement Pt reports 25% overall improvement in symptoms and ease of straightening knees since the start of care.  Pt has had limitations in abillity to exercise due to cardiac status and difficulty breathing.  Pt demonstrates improved quality of movement and ease of movement with strength exercises.  Pt with improved knee AROM and hip strength today although significantly limited. Pt will continue to benefit from skilled PT for strength, knee ROM and overall mobility gains.     Pt will benefit from skilled therapeutic intervention in order to improve on the following deficits Decreased range of motion;Obesity;Impaired vision/preception;Pain;Impaired flexibility;Decreased strength   Rehab Potential Good   PT Frequency 2x / week   PT Duration 8 weeks   PT Treatment/Interventions ADLs/Self Care Home Management;Therapeutic activities;Therapeutic exercise;Prosthetic Training;Cryotherapy;Electrical Stimulation;Neuromuscular re-education;Patient/family education;Passive range of motion;Compression bandaging;Energy conservation   PT Next Visit Plan Continue stretching, hip and knee strength   Consulted and Agree with Plan of Care Patient          G-Codes - 04/15/15 0955     Functional Assessment Tool Used FOTO: 70% limitation + clinical   Functional Limitation Mobility: Walking and moving around   Mobility: Walking and Moving Around Current Status ([id number]) At least 60 percent  but less than 80 percent impaired, limited or restricted   Mobility: Walking and Moving Around Goal Status [phone]) At least 40 percent but less than 60 percent impaired, limited or restricted      Problem List Patient Active Problem List   Diagnosis Date Noted  . Acute on chronic systolic heart failure 02/21/2015  . Type II diabetes mellitus with ophthalmic manifestations, uncontrolled 12/22/2014  . Hyperlipidemia 12/22/2014  . CAD (coronary artery disease) 12/12/2014  . Diabetic retinopathy 12/12/2014  . PVD (peripheral vascular disease) 12/12/2014  . CKD (chronic kidney disease) stage 3, GFR 30-59 ml/min 12/12/2014  . Type 2 diabetes mellitus with diabetic nephropathy 11/28/2014  . Chronic systolic congestive heart failure 11/28/2014  . Cirrhosis 11/28/2014  . Generalized OA 11/28/2014  . S/P bilateral BKA (below knee amputation) 11/28/2014   Physical Therapy Progress Note  Dates of Reporting Period:  01/05/15 to 03/23/15  Objective Reports of Subjective Statement: 25% overall improvement since the start of care.    Objective Measurements: see above  Goal Update: see above  Plan: Continue to advance hip and knee strength and hip/knee AROM/flexibiity.  Reason Skilled Services are Required: Pt continues to demonstrate knee flexion contractures and hip/knee weakness.  Pt will benefit from continued hip and knee strength and AROM.    Hesston Hitchens, PT 03/23/2015, 10:26 AM  [facility] [address] [place], [place], [address] Phone: [phone]   Fax:  [phone]

## 2015-03-26 ENCOUNTER — Encounter: Payer: Self-pay | Admitting: Physical Therapy

## 2015-03-26 ENCOUNTER — Ambulatory Visit: Payer: PRIVATE HEALTH INSURANCE | Admitting: Physical Therapy

## 2015-03-26 DIAGNOSIS — M25659 Stiffness of unspecified hip, not elsewhere classified: Secondary | ICD-10-CM | POA: Diagnosis not present

## 2015-03-26 DIAGNOSIS — R29898 Other symptoms and signs involving the musculoskeletal system: Secondary | ICD-10-CM | POA: Diagnosis not present

## 2015-03-26 DIAGNOSIS — M24669 Ankylosis, unspecified knee: Secondary | ICD-10-CM | POA: Diagnosis not present

## 2015-03-26 DIAGNOSIS — M25669 Stiffness of unspecified knee, not elsewhere classified: Secondary | ICD-10-CM

## 2015-03-26 NOTE — Therapy (Signed)
[facility] [address] [place], [place], [address] Phone: [phone]   Fax:  [phone]  Physical Therapy Treatment  Patient Details  Name: Sue Martin MRN: [id number] Date of Birth: 04/03/1966 Referring Provider:  Terressa Koyanagi, DO  Encounter Date: 03/26/2015      PT End of Session - 03/26/15 1027    Visit Number 11   Number of Visits 20  Medicare   PT Start Time 1015   PT Stop Time 1055   PT Time Calculation (min) 40 min   Activity Tolerance Patient tolerated treatment well   Behavior During Therapy [facility] for tasks assessed/performed      Past Medical History  Diagnosis Date  . Diabetes mellitus with renal complications     PVD and retinopathy, S/p bilat ambutations  . CHF (congestive heart failure)     sees Dr. Wynonia Hazard  . Chronic kidney disease   . Liver cirrhosis   . Retinal detachment     sees Dr. Johna Sheriff per her report  . Lyme disease   . Glaucoma   . CAD (coronary artery disease) 12/12/2014    -s/p 2V PCI of the LAD/RCA with taxus stents -cardiologist is Dr. Carmon Sails, Celedonio Savage   . Depression     Past Surgical History  Procedure Laterality Date  . Leg amputation below knee  09/09/2011, 09/11/2011  . Tubal ligation    . Lasik      There were no vitals filed for this visit.  Visit Diagnosis:  Weakness of both lower extremities  Decreased range of motion (ROM) of knee  Stiffness of hip joint, unspecified laterality      Subjective Assessment - 03/26/15 1025    Subjective I almost cancelled today.  I have been in pain due to the weather.  I had to stop some heart medication and cut one in half therefore I have tightness in my chest.  I have a echogram tomorrow.    Pertinent History bilateral below knee ampuations   How long can you sit comfortably? 2 - 5 hours    How long can you stand comfortably? N/A   How long can you walk comfortably? N/A    Patient Stated Goals To gain  flexibility in order to decrease pain, would like to go back to prosthetics    Currently in Pain? Yes   Pain Score 8    Pain Location --  all over   Pain Orientation Right;Left;Lower   Pain Descriptors / Indicators Aching   Pain Type Chronic pain   Pain Onset More than a month ago   Pain Frequency Constant   Aggravating Factors  weather related   Pain Relieving Factors rest, laying down                         [facility] Adult PT Treatment/Exercise - 03/26/15 0001    Lumbar Exercises: Stretches   Single Knee to Chest Stretch 5 reps;Other (comment);Limitations  VC to releax chest muscles with breathing, 3x each side   Single Knee to Chest Stretch Limitations uses a towel t oassist   Lumbar Exercises: Supine   Straight Leg Raise 20 reps  VC to prevent abduction of hip   Knee/Hip Exercises: Supine   Quad Sets Strengthening;Right;Left;10 reps;2 sets   Short Arc Quad Sets Strengthening;Both;3 sets  3x10, VC to hold 5 sec, small roll under knees   Other Supine Knee/Hip Exercises knees on large roll on blue  mat with gluteal squeeze                PT Education - 03/26/15 1053    Education provided No          PT Short Term Goals - 03/05/15 1030    PT SHORT TERM GOAL #1   Title Be independent with home exercise program    Time 4   Period Weeks   Status Achieved   PT SHORT TERM GOAL #2   Title Decrease bil. knee pain by 25% with wheelchair <> bed transfers due to improved knee flexiblity    Time 4   Period Weeks   Status Achieved           PT Long Term Goals - 03/23/15 1001    PT LONG TERM GOAL #1   Title Be independent with advanced HEP.    Time 8   Period Weeks   Status On-going   PT LONG TERM GOAL #2   Title Improve hip flexion and knee extension strength to 4-/5 bilaterally to improve ease with mobility and transfers   Time 8   Period Weeks   Status On-going  see MMT   PT LONG TERM GOAL #3   Title decrease bilateral knee pain by 40%  with transfers due to improved flexiblity   Time 8   Period Weeks   Status On-going  25% reduction in pain   PT LONG TERM GOAL #4   Title Caregiver verbalizes understanding of how to assist with LE stretching techniques at home    Status Achieved   PT LONG TERM GOAL #5   Title Reduce FOTO limitation to < or = to 47%    Time 8   Period Weeks   Status On-going  70% limitation               Plan - 03/26/15 1053    Clinical Impression Statement Patient had to do exercises slowly due to her heart and increased pain.  Patient is unable to do a bridge therefore working on gluteal squeezes.  Patient works hard in therapy.  Patient will benefit from physical therapy to improve strength and mobility .   Pt will benefit from skilled therapeutic intervention in order to improve on the following deficits Decreased range of motion;Obesity;Impaired vision/preception;Pain;Impaired flexibility;Decreased strength   Rehab Potential Good   Clinical Impairments Affecting Rehab Potential Bil. BKA   PT Frequency 2x / week   PT Duration 8 weeks   PT Treatment/Interventions ADLs/Self Care Home Management;Therapeutic activities;Therapeutic exercise;Prosthetic Training;Cryotherapy;Electrical Stimulation;Neuromuscular re-education;Patient/family education;Passive range of motion;Compression bandaging;Energy conservation   PT Next Visit Plan Continue stretching, hip and knee strength   PT Home Exercise Plan progress as needed   Consulted and Agree with Plan of Care Patient        Problem List Patient Active Problem List   Diagnosis Date Noted  . Acute on chronic systolic heart failure 02/21/2015  . Type II diabetes mellitus with ophthalmic manifestations, uncontrolled 12/22/2014  . Hyperlipidemia 12/22/2014  . CAD (coronary artery disease) 12/12/2014  . Diabetic retinopathy 12/12/2014  . PVD (peripheral vascular disease) 12/12/2014  . CKD (chronic kidney disease) stage 3, GFR 30-59 ml/min  12/12/2014  . Type 2 diabetes mellitus with diabetic nephropathy 11/28/2014  . Chronic systolic congestive heart failure 11/28/2014  . Cirrhosis 11/28/2014  . Generalized OA 11/28/2014  . S/P bilateral BKA (below knee amputation) 11/28/2014    GRAY,CHERYL,PT 03/26/2015, 10:55 AM  [facility] [address], [address] [place], [place], [address] Phone: [phone]   Fax:  [phone]

## 2015-03-27 DIAGNOSIS — I255 Ischemic cardiomyopathy: Secondary | ICD-10-CM | POA: Diagnosis not present

## 2015-03-27 DIAGNOSIS — I2510 Atherosclerotic heart disease of native coronary artery without angina pectoris: Secondary | ICD-10-CM | POA: Diagnosis not present

## 2015-03-27 DIAGNOSIS — I5022 Chronic systolic (congestive) heart failure: Secondary | ICD-10-CM | POA: Diagnosis not present

## 2015-03-30 ENCOUNTER — Encounter: Payer: Self-pay | Admitting: Physical Therapy

## 2015-03-30 ENCOUNTER — Ambulatory Visit: Payer: No Typology Code available for payment source | Attending: Family Medicine | Admitting: Physical Therapy

## 2015-03-30 DIAGNOSIS — M25659 Stiffness of unspecified hip, not elsewhere classified: Secondary | ICD-10-CM | POA: Diagnosis not present

## 2015-03-30 DIAGNOSIS — R29898 Other symptoms and signs involving the musculoskeletal system: Secondary | ICD-10-CM | POA: Diagnosis not present

## 2015-03-30 DIAGNOSIS — M24669 Ankylosis, unspecified knee: Secondary | ICD-10-CM | POA: Diagnosis not present

## 2015-03-30 DIAGNOSIS — M25669 Stiffness of unspecified knee, not elsewhere classified: Secondary | ICD-10-CM

## 2015-03-30 NOTE — Therapy (Signed)
[facility] [address], [address] [place], [place], [address] Phone: [phone]   Fax:  [phone]  Physical Therapy Treatment  Patient Details  Name: Sue Martin MRN: [id number] Date of Birth: 1965-11-01 Referring Provider:  Terressa Koyanagi, DO  Encounter Date: 03/30/2015      PT End of Session - 03/30/15 1051    Visit Number 12   Number of Visits 20   Date for PT Re-Evaluation 04/27/15   PT Start Time 1000   PT Stop Time 1050   PT Time Calculation (min) 50 min   Activity Tolerance Patient tolerated treatment well   Behavior During Therapy [facility] for tasks assessed/performed      Past Medical History  Diagnosis Date  . Diabetes mellitus with renal complications (HCC)     PVD and retinopathy, S/p bilat ambutations  . CHF (congestive heart failure) (HCC)     sees Dr. Wynonia Hazard  . Chronic kidney disease   . Liver cirrhosis (HCC)   . Retinal detachment     sees Dr. Johna Sheriff per her report  . Lyme disease   . Glaucoma   . CAD (coronary artery disease) 12/12/2014    -s/p 2V PCI of the LAD/RCA with taxus stents -cardiologist is Dr. Carmon Sails, Celedonio Savage   . Depression     Past Surgical History  Procedure Laterality Date  . Leg amputation below knee  09/09/2011, 09/11/2011  . Tubal ligation    . Lasik      There were no vitals filed for this visit.  Visit Diagnosis:  Weakness of both lower extremities  Decreased range of motion (ROM) of knee  Stiffness of hip joint, unspecified laterality      Subjective Assessment - 03/30/15 1019    Subjective I found out today my ejection fraction is 30%. I had a big day yesterday being in chair too long so knees are achey.    Currently in Pain? Yes   Pain Score 6    Pain Location Knee   Pain Orientation Right;Left   Aggravating Factors  Sitting too long in chair    Pain Relieving Factors rest, laying down and the knee exercises.    Multiple Pain Sites No                          [facility] Adult PT Treatment/Exercise - 03/30/15 0001    Lumbar Exercises: Stretches   Single Knee to Chest Stretch 5 reps;Other (comment);Limitations  VC to releax chest muscles with breathing, 3x each side   Single Knee to Chest Stretch Limitations uses a towel t oassist   Knee/Hip Exercises: Supine   Quad Sets Strengthening;Right;Left;10 reps;2 sets  VC to count out loud so she doesn't hold her breath.   Short Arc [employer] Strengthening;Both;3 sets  3x10, VC to hold 5 sec, small roll under knees   Other Supine Knee/Hip Exercises knees on large roll on blue mat with gluteal squeeze  10x, VC to count outloud   Manual Therapy   Manual Therapy --  Passive knee extension stretch bil knees                  PT Short Term Goals - 03/05/15 1030    PT SHORT TERM GOAL #1   Title Be independent with home exercise program    Time 4   Period Weeks   Status Achieved   PT SHORT TERM GOAL #2   Title Decrease  bil. knee pain by 25% with wheelchair <> bed transfers due to improved knee flexiblity    Time 4   Period Weeks   Status Achieved           PT Long Term Goals - 03/30/15 1044    PT LONG TERM GOAL #3   Title decrease bilateral knee pain by 40% with transfers due to improved flexiblity   Time 8   Period Weeks   Status On-going  Still 25%   PT LONG TERM GOAL #4   Title Caregiver verbalizes understanding of how to assist with LE stretching techniques at home    Time 8   Period Weeks   Status Achieved               Plan - 03/30/15 1052    Clinical Impression Statement Pt is planning on asking her MD for a [facility] referral in order to get knee / 'limb supports so her limbs don't hang down into knee flexion. Once she gets the referral she plans to call neuro for PT WC assessment.    Pt will benefit from skilled therapeutic intervention in order to improve on the following deficits Decreased range of motion;Obesity;Impaired  vision/preception;Pain;Impaired flexibility;Decreased strength   Rehab Potential Good   Clinical Impairments Affecting Rehab Potential Bil. BKA   PT Frequency 2x / week   PT Duration 8 weeks   PT Treatment/Interventions ADLs/Self Care Home Management;Therapeutic activities;Therapeutic exercise;Prosthetic Training;Cryotherapy;Electrical Stimulation;Neuromuscular re-education;Patient/family education;Passive range of motion;Compression bandaging;Energy conservation   PT Next Visit Plan Continue stretching, hip and knee strength   Consulted and Agree with Plan of Care Patient        Problem List Patient Active Problem List   Diagnosis Date Noted  . Acute on chronic systolic heart failure (HCC) 02/21/2015  . Type II diabetes mellitus with ophthalmic manifestations, uncontrolled (HCC) 12/22/2014  . Hyperlipidemia 12/22/2014  . CAD (coronary artery disease) 12/12/2014  . Diabetic retinopathy (HCC) 12/12/2014  . PVD (peripheral vascular disease) (HCC) 12/12/2014  . CKD (chronic kidney disease) stage 3, GFR 30-59 ml/min 12/12/2014  . Type 2 diabetes mellitus with diabetic nephropathy (HCC) 11/28/2014  . Chronic systolic congestive heart failure (HCC) 11/28/2014  . Cirrhosis (HCC) 11/28/2014  . Generalized OA 11/28/2014  . S/P bilateral BKA (below knee amputation) (HCC) 11/28/2014    Shruthi Northrup, PTA 03/30/2015, 10:58 AM  [facility] [address] [place], [place], [address] Phone: [phone]   Fax:  [phone]

## 2015-03-31 DIAGNOSIS — I739 Peripheral vascular disease, unspecified: Secondary | ICD-10-CM | POA: Diagnosis not present

## 2015-03-31 DIAGNOSIS — N183 Chronic kidney disease, stage 3 (moderate): Secondary | ICD-10-CM | POA: Diagnosis not present

## 2015-03-31 DIAGNOSIS — I509 Heart failure, unspecified: Secondary | ICD-10-CM | POA: Diagnosis not present

## 2015-04-01 ENCOUNTER — Encounter: Payer: Self-pay | Admitting: Family Medicine

## 2015-04-01 ENCOUNTER — Ambulatory Visit (INDEPENDENT_AMBULATORY_CARE_PROVIDER_SITE_OTHER): Payer: PRIVATE HEALTH INSURANCE | Admitting: Family Medicine

## 2015-04-01 VITALS — BP 110/80 | HR 85 | Temp 98.4°F | Ht <= 58 in

## 2015-04-01 DIAGNOSIS — Z89511 Acquired absence of right leg below knee: Secondary | ICD-10-CM

## 2015-04-01 DIAGNOSIS — S0300XS Dislocation of jaw, unspecified side, sequela: Secondary | ICD-10-CM

## 2015-04-01 DIAGNOSIS — J309 Allergic rhinitis, unspecified: Secondary | ICD-10-CM | POA: Diagnosis not present

## 2015-04-01 DIAGNOSIS — M26609 Unspecified temporomandibular joint disorder, unspecified side: Secondary | ICD-10-CM

## 2015-04-01 DIAGNOSIS — Z89512 Acquired absence of left leg below knee: Secondary | ICD-10-CM

## 2015-04-01 DIAGNOSIS — I25810 Atherosclerosis of coronary artery bypass graft(s) without angina pectoris: Secondary | ICD-10-CM

## 2015-04-01 MED ORDER — FEXOFENADINE HCL 60 MG PO TABS: 60.0000 mg | ORAL_TABLET | Freq: Two times a day (BID) | ORAL | Status: DC

## 2015-04-01 NOTE — Progress Notes (Signed)
HPI:  Ear pain: -started: L ear pain for 5 days -symptoms:pain in ears, ears feel "clogged", sinus pressure, nasal congestion, low grade fever at highest 99 -denies:fever > 100, SOB, NVD, tooth pain -has tried: heat and benadryl -sick contacts/travel/risks: denies flu exposure, tick exposure or or Ebola risks -Hx OZ:HYQMVHQ hx of bad allergies  Wants referral to "[facility]" for a new scooter with leg rest as reports her cardiologist advised needs elevation of legs in scooter to assist her heart.   ROS: See pertinent positives and negatives per HPI.  Past Medical History  Diagnosis Date  . Diabetes mellitus with renal complications (HCC)     PVD and retinopathy, S/p bilat ambutations  . CHF (congestive heart failure) (HCC)     sees Dr. Wynonia Hazard  . Chronic kidney disease   . Liver cirrhosis (HCC)   . Retinal detachment     sees Dr. Johna Sheriff per her report  . Lyme disease   . Glaucoma   . CAD (coronary artery disease) 12/12/2014    -s/p 2V PCI of the LAD/RCA with taxus stents -cardiologist is Dr. Carmon Sails, Celedonio Savage   . Depression     Past Surgical History  Procedure Laterality Date  . Leg amputation below knee  09/09/2011, 09/11/2011  . Tubal ligation    . Lasik      Family History  Problem Relation Age of Onset  . Arthritis Mother   . Heart disease Mother   . Mental illness Mother   . Diabetes Mother   . Arthritis Maternal Grandmother   . Diabetes Maternal Grandmother   . Arthritis Father   . CVA Father   . Hypertension Father   . Sudden death Paternal Grandfather     Social History   Social History  . Marital Status: Married    Spouse Name: N/A  . Number of Children: N/A  . Years of Education: N/A   Social History Main Topics  . Smoking status: Never Smoker   . Smokeless tobacco: None  . Alcohol Use: No  . Drug Use: No  . Sexual Activity: Not Asked   Other Topics Concern  . None   Social History Narrative   Married   Husband is a Ecologist        Current outpatient prescriptions:  .  aspirin 325 MG tablet, Take 325 mg by mouth at bedtime. , Disp: , Rfl:  .  atorvastatin (LIPITOR) 10 MG tablet, Take 10 mg by mouth daily at 6 PM. , Disp: , Rfl:  .  carvedilol (COREG) 3.125 MG tablet, Take 3.125 mg by mouth 2 (two) times daily with a meal., Disp: , Rfl:  .  cefpodoxime (VANTIN) 200 MG tablet, Take 1 tablet (200 mg total) by mouth 2 (two) times daily., Disp: 12 tablet, Rfl: 0 .  clopidogrel (PLAVIX) 75 MG tablet, Take 75 mg by mouth at bedtime. , Disp: , Rfl:  .  cyclobenzaprine (FLEXERIL) 10 MG tablet, Take 10 mg by mouth 3 (three) times daily as needed for muscle spasms. , Disp: , Rfl:  .  glucose blood test strip, Check sugars four times daily   Dx E11.9, Disp: , Rfl:  .  Insulin Glargine (LANTUS SOLOSTAR) 100 UNIT/ML Solostar Pen, Inject 40-55 Units into the skin 2 (two) times daily. 55 units every morning and 40 units every evening., Disp: , Rfl:  .  insulin lispro (HUMALOG) 100 UNIT/ML KiwkPen, Inject 25 units three times a day with meals, Disp: 30 mL,  Rfl: 3 .  lisinopril (PRINIVIL,ZESTRIL) 10 MG tablet, Take 10 mg by mouth at bedtime. , Disp: , Rfl:  .  metolazone (ZAROXOLYN) 5 MG tablet, Take by mouth daily. 30 minutes before Demadex dose, Disp: , Rfl:  .  ondansetron (ZOFRAN) 4 MG tablet, Take 1 tablet (4 mg total) by mouth every 8 (eight) hours as needed for nausea or vomiting., Disp: 20 tablet, Rfl: 0 .  torsemide (DEMADEX) 20 MG tablet, Take 1 tablet (20 mg total) by mouth 2 (two) times daily., Disp: 60 tablet, Rfl: 0 .  fexofenadine (ALLEGRA) 60 MG tablet, Take 1 tablet (60 mg total) by mouth 2 (two) times daily., Disp: 30 tablet, Rfl: 0  EXAM:  Filed Vitals:   04/01/15 1124  BP: 110/80  Pulse: 85  Temp: 98.4 F (36.9 C)    There is no weight on file to calculate BMI.  GENERAL: vitals reviewed and listed above, alert, oriented, appears well hydrated and in no acute distress  HEENT: atraumatic,  conjunttiva clear, no obvious abnormalities on inspection of external nose and ears, normal appearance of ear canals and TMs, clear nasal congestion, mild post oropharyngeal erythema with PND, no tonsillar edema or exudate, no sinus TTP, deviation of jaw with opening and shutting with L mild TMJ crepitus an dTTP over L TMJ.  NECK: no obvious masses on inspection, no carotid art TTP or bruit on L.  LUNGS: clear to auscultation bilaterally, no wheezes, rales or rhonchi, good air movement  CV: HRRR, no peripheral edema  MS: moves all extremities without noticeable abnormality  PSYCH: pleasant and cooperative, no obvious depression or anxiety  ASSESSMENT AND PLAN:  Discussed the following assessment and plan:  Allergic rhinitis, unspecified allergic rhinitis type  TMJ (dislocation of temporomandibular joint), sequela  S/P bilateral BKA (below knee amputation) (HCC) - Plan: Referral to Neuro Rehab  -we discussed possible serious and likely etiologies, workup and treatment, treatment risks and return precautions -her ear pain does not appear to be an infection - no signs of otitis externa or otitis media on exam, query TMJ dysfunction as more likely and HEP provided -she also has chronic allergic rhinitis and reports she can not afford allegra but wonders if rx will help as helpful in the past, reports she can not take flonase -advised she call in 1-2 weeks to let me know how she is doing -of course, we advised Frederika  to return or notify a doctor immediately if symptoms worsen or persist or new concerns arise.    Patient Instructions  BEFORE YOU LEAVE: -TMJ exercises  Please try the exercises daily and allegra for your allergies. Please call in 1 week if symptom not better or sooner if worsening.     Kriste Basque R.

## 2015-04-01 NOTE — Progress Notes (Signed)
Pre visit review using our clinic review tool, if applicable. No additional management support is needed unless otherwise documented below in the visit note. 

## 2015-04-01 NOTE — Patient Instructions (Signed)
BEFORE YOU LEAVE: -TMJ exercises  Please try the exercises daily and allegra for your allergies. Please call in 1 week if symptom not better or sooner if worsening.

## 2015-04-02 ENCOUNTER — Ambulatory Visit: Payer: No Typology Code available for payment source | Admitting: Physical Therapy

## 2015-04-02 ENCOUNTER — Encounter: Payer: Self-pay | Admitting: Physical Therapy

## 2015-04-02 DIAGNOSIS — M25669 Stiffness of unspecified knee, not elsewhere classified: Secondary | ICD-10-CM

## 2015-04-02 DIAGNOSIS — M24669 Ankylosis, unspecified knee: Secondary | ICD-10-CM | POA: Diagnosis not present

## 2015-04-02 DIAGNOSIS — M25659 Stiffness of unspecified hip, not elsewhere classified: Secondary | ICD-10-CM

## 2015-04-02 DIAGNOSIS — R29898 Other symptoms and signs involving the musculoskeletal system: Secondary | ICD-10-CM | POA: Diagnosis not present

## 2015-04-02 NOTE — Therapy (Signed)
[facility] [address] [place], [place], [address] Phone: [phone]   Fax:  [phone]  Physical Therapy Treatment  Patient Details  Name: Sue Martin MRN: [id number] Date of Birth: [birth year] Referring Provider:  Terressa Koyanagi, DO  Encounter Date: 04/02/2015      PT End of Session - 04/02/15 1025    Visit Number 13   Number of Visits 20   Date for PT Re-Evaluation 04/27/15   PT Start Time 1015   PT Stop Time 1055   PT Time Calculation (min) 40 min   Activity Tolerance Patient tolerated treatment well   Behavior During Therapy [facility] for tasks assessed/performed      Past Medical History  Diagnosis Date  . Diabetes mellitus with renal complications (HCC)     PVD and retinopathy, S/p bilat ambutations  . CHF (congestive heart failure) (HCC)     sees Dr. Wynonia Hazard  . Chronic kidney disease   . Liver cirrhosis (HCC)   . Retinal detachment     sees Dr. Johna Sheriff per her report  . Lyme disease   . Glaucoma   . CAD (coronary artery disease) 12/12/2014    -s/p 2V PCI of the LAD/RCA with taxus stents -cardiologist is Dr. Carmon Sails, Celedonio Savage   . Depression     Past Surgical History  Procedure Laterality Date  . Leg amputation below knee  09/09/2011, 09/11/2011  . Tubal ligation    . Lasik      There were no vitals filed for this visit.  Visit Diagnosis:  Weakness of both lower extremities  Decreased range of motion (ROM) of knee  Stiffness of hip joint, unspecified laterality      Subjective Assessment - 04/02/15 1028    Subjective I feel like I am getting some mobility back. I am trying to get a referral for a electric wheelchair.   Pertinent History bilateral below knee ampuations   How long can you sit comfortably? 2 - 5 hours    How long can you stand comfortably? N/A   How long can you walk comfortably? N/A    Patient Stated Goals To gain flexibility in order to decrease pain, would like to go  back to prosthetics    Currently in Pain? Yes   Pain Score 6    Pain Location Back  knees   Pain Orientation Right;Left   Pain Descriptors / Indicators Aching   Pain Type Chronic pain   Pain Onset More than a month ago   Pain Frequency Constant   Aggravating Factors  sitting too long in chair   Pain Relieving Factors rest,laying down and the knee exercises   Multiple Pain Sites No                         [facility] Adult PT Treatment/Exercise - 04/02/15 0001    Lumbar Exercises: Stretches   Single Knee to Chest Stretch 5 reps;Other (comment);Limitations  VC to releax chest muscles with breathing, 3x each side   Single Knee to Chest Stretch Limitations uses a towel t oassist   Lumbar Exercises: Supine   Bridge 15 reps  with knees on foam roll   Straight Leg Raise 20 reps  VC to prevent abduction of hip   Knee/Hip Exercises: Supine   Quad Sets Strengthening;Right;Left;10 reps;2 sets  VC to count out loud so she doesn't hold her breath.   Short Arc [employer] Strengthening;Both;3 sets  3x10,  VC to hold 5 sec, small roll under knees   Hip Adduction Isometric Right;Left;Both;10 reps  hold 10 sec, ball between knees and stumps on foam roll   Manual Therapy   Manual Therapy Passive ROM   Manual therapy comments PROM and prolonged hold (gentle) into knee extension                PT Education - 04/02/15 1055    Education provided No          PT Short Term Goals - 03/05/15 1030    PT SHORT TERM GOAL #1   Title Be independent with home exercise program    Time 4   Period Weeks   Status Achieved   PT SHORT TERM GOAL #2   Title Decrease bil. knee pain by 25% with wheelchair <> bed transfers due to improved knee flexiblity    Time 4   Period Weeks   Status Achieved           PT Long Term Goals - 03/30/15 1044    PT LONG TERM GOAL #3   Title decrease bilateral knee pain by 40% with transfers due to improved flexiblity   Time 8   Period Weeks    Status On-going  Still 25%   PT LONG TERM GOAL #4   Title Caregiver verbalizes understanding of how to assist with LE stretching techniques at home    Time 8   Period Weeks   Status Achieved               Plan - 04/02/15 1055    Clinical Impression Statement Patient is still working on a referral for wheelchair.  Patient able to exercise with no increase in pain.  Patient works hard in therapy.  Patient is progresing toward goals.  Paitent is able to lift her hips easier with bridge. Patient benefits from  physical therapy to increase strength and mobility.    Pt will benefit from skilled therapeutic intervention in order to improve on the following deficits Decreased range of motion;Obesity;Impaired vision/preception;Pain;Impaired flexibility;Decreased strength   Rehab Potential Good   Clinical Impairments Affecting Rehab Potential Bil. BKA   PT Frequency 2x / week   PT Duration 8 weeks   PT Treatment/Interventions ADLs/Self Care Home Management;Therapeutic activities;Therapeutic exercise;Prosthetic Training;Cryotherapy;Electrical Stimulation;Neuromuscular re-education;Patient/family education;Passive range of motion;Compression bandaging;Energy conservation   PT Next Visit Plan Continue stretching, hip and knee strength   PT Home Exercise Plan progress as needed   Consulted and Agree with Plan of Care Patient        Problem List Patient Active Problem List   Diagnosis Date Noted  . Acute on chronic systolic heart failure (HCC) 02/21/2015  . Type II diabetes mellitus with ophthalmic manifestations, uncontrolled (HCC) 12/22/2014  . Hyperlipidemia 12/22/2014  . CAD (coronary artery disease) 12/12/2014  . Diabetic retinopathy (HCC) 12/12/2014  . PVD (peripheral vascular disease) (HCC) 12/12/2014  . CKD (chronic kidney disease) stage 3, GFR 30-59 ml/min 12/12/2014  . Type 2 diabetes mellitus with diabetic nephropathy (HCC) 11/28/2014  . Chronic systolic congestive heart  failure (HCC) 11/28/2014  . Cirrhosis (HCC) 11/28/2014  . Generalized OA 11/28/2014  . S/P bilateral BKA (below knee amputation) (HCC) 11/28/2014    Key Cen,PT 04/02/2015, 10:58 AM  [facility] [address] [place], [place], [address] Phone: [phone]   Fax:  [phone]

## 2015-04-06 ENCOUNTER — Ambulatory Visit: Payer: No Typology Code available for payment source | Admitting: Physical Therapy

## 2015-04-08 ENCOUNTER — Encounter: Payer: Self-pay | Admitting: *Deleted

## 2015-04-08 ENCOUNTER — Ambulatory Visit: Payer: PRIVATE HEALTH INSURANCE | Admitting: Endocrinology

## 2015-04-08 ENCOUNTER — Telehealth: Payer: Self-pay | Admitting: Endocrinology

## 2015-04-08 DIAGNOSIS — Z0289 Encounter for other administrative examinations: Secondary | ICD-10-CM

## 2015-04-08 NOTE — Telephone Encounter (Signed)
Patient no showed today's appt. Please advise on how to follow up. °A. No follow up necessary. °B. Follow up urgent. Contact patient immediately. °C. Follow up necessary. Contact patient and schedule visit in ___ days. °D. Follow up advised. Contact patient and schedule visit in ____weeks. ° °

## 2015-04-08 NOTE — Telephone Encounter (Signed)
Letter mailed

## 2015-04-09 ENCOUNTER — Ambulatory Visit: Payer: No Typology Code available for payment source | Admitting: Physical Therapy

## 2015-04-09 ENCOUNTER — Encounter: Payer: Self-pay | Admitting: Physical Therapy

## 2015-04-09 DIAGNOSIS — M24669 Ankylosis, unspecified knee: Secondary | ICD-10-CM | POA: Diagnosis not present

## 2015-04-09 DIAGNOSIS — M25669 Stiffness of unspecified knee, not elsewhere classified: Secondary | ICD-10-CM

## 2015-04-09 DIAGNOSIS — M25659 Stiffness of unspecified hip, not elsewhere classified: Secondary | ICD-10-CM

## 2015-04-09 DIAGNOSIS — R29898 Other symptoms and signs involving the musculoskeletal system: Secondary | ICD-10-CM

## 2015-04-09 NOTE — Therapy (Signed)
[facility] [address] [place], [place], [address] Phone: [phone]   Fax:  [phone]  Physical Therapy Treatment  Patient Details  Name: Sue Martin MRN: [id number] Date of Birth: 09-18-1965 Referring Provider:  Terressa Koyanagi, DO  Encounter Date: 04/09/2015      PT End of Session - 04/09/15 1343    Visit Number 14   Number of Visits 20   Date for PT Re-Evaluation 04/27/15   PT Start Time 1024  Scat bus arrived late   PT Stop Time 1104   PT Time Calculation (min) 40 min   Activity Tolerance Patient tolerated treatment well   Behavior During Therapy [facility] for tasks assessed/performed      Past Medical History  Diagnosis Date  . Diabetes mellitus with renal complications (HCC)     PVD and retinopathy, S/p bilat ambutations  . CHF (congestive heart failure) (HCC)     sees Dr. Wynonia Hazard  . Chronic kidney disease   . Liver cirrhosis (HCC)   . Retinal detachment     sees Dr. Johna Sheriff per her report  . Lyme disease   . Glaucoma   . CAD (coronary artery disease) 12/12/2014    -s/p 2V PCI of the LAD/RCA with taxus stents -cardiologist is Dr. Carmon Sails, Celedonio Savage   . Depression     Past Surgical History  Procedure Laterality Date  . Leg amputation below knee  09/09/2011, 09/11/2011  . Tubal ligation    . Lasik      There were no vitals filed for this visit.  Visit Diagnosis:  Weakness of both lower extremities  Decreased range of motion (ROM) of knee  Stiffness of hip joint, unspecified laterality      Subjective Assessment - 04/09/15 1043    Subjective Some days I feel stronger, but than my muscles hurt due to exercises -since I'm working them-   Pertinent History bilateral below knee ampuations   How long can you sit comfortably? 2 - 5 hours    Patient Stated Goals To gain flexibility in order to decrease pain, would like to go back to prosthetics    Currently in Pain? Yes   Pain Score 7    Pain Location Back   Pain Orientation Right;Left   Pain Descriptors / Indicators Aching   Pain Type Chronic pain   Pain Onset More than a month ago   Pain Frequency Constant   Multiple Pain Sites No                         [facility] Adult PT Treatment/Exercise - 04/09/15 0001    Lumbar Exercises: Stretches   Single Knee to Chest Stretch 5 reps;Other (comment);Limitations   Single Knee to Chest Stretch Limitations uses a towel t oassist   Lumbar Exercises: Supine   Bridge 15 reps  with knee on foam roll   Straight Leg Raise 20 reps   Knee/Hip Exercises: Supine   Quad Sets Strengthening;Right;Left;10 reps;2 sets  reminder not to hold breath   Short Arc Quad Sets Strengthening;Both;3 sets  3x10, holding 5 sec, black small roll under knees   Hip Adduction Isometric Right;Left;Both;10 reps  hold 10sec, ball bw knees, and residual limb on blue foam     Other Supine Knee/Hip Exercises knees on large roll on blue mat with gluteal squeeze  10x pt with counting out to avoid holding her breath   Manual Therapy   Manual Therapy Passive  ROM   Manual therapy comments PROM and prolonged hold (gentle) into knee extension                  PT Short Term Goals - 03/05/15 1030    PT SHORT TERM GOAL #1   Title Be independent with home exercise program    Time 4   Period Weeks   Status Achieved   PT SHORT TERM GOAL #2   Title Decrease bil. knee pain by 25% with wheelchair <> bed transfers due to improved knee flexiblity    Time 4   Period Weeks   Status Achieved           PT Long Term Goals - 03/30/15 1044    PT LONG TERM GOAL #3   Title decrease bilateral knee pain by 40% with transfers due to improved flexiblity   Time 8   Period Weeks   Status On-going  Still 25%   PT LONG TERM GOAL #4   Title Caregiver verbalizes understanding of how to assist with LE stretching techniques at home    Time 8   Period Weeks   Status Achieved               Plan  - 04/09/15 1344    Clinical Impression Statement Pt is able to perform her exercises and transfers independently from W/C to mat table. pt very determined with exercises and is progressing toward goal. Pt will continue to benefit from Pt to increase strength and mobility   Pt will benefit from skilled therapeutic intervention in order to improve on the following deficits Decreased range of motion;Obesity;Impaired vision/preception;Pain;Impaired flexibility;Decreased strength   Rehab Potential Good   Clinical Impairments Affecting Rehab Potential Bil. BKA   PT Frequency 2x / week   PT Duration 8 weeks   PT Treatment/Interventions ADLs/Self Care Home Management;Therapeutic activities;Therapeutic exercise;Prosthetic Training;Cryotherapy;Electrical Stimulation;Neuromuscular re-education;Patient/family education;Passive range of motion;Compression bandaging;Energy conservation   PT Next Visit Plan Continue stretching, hip and knee strength   PT Home Exercise Plan progress as needed   Consulted and Agree with Plan of Care Patient        Problem List Patient Active Problem List   Diagnosis Date Noted  . Acute on chronic systolic heart failure (HCC) 02/21/2015  . Type II diabetes mellitus with ophthalmic manifestations, uncontrolled (HCC) 12/22/2014  . Hyperlipidemia 12/22/2014  . CAD (coronary artery disease) 12/12/2014  . Diabetic retinopathy (HCC) 12/12/2014  . PVD (peripheral vascular disease) (HCC) 12/12/2014  . CKD (chronic kidney disease) stage 3, GFR 30-59 ml/min 12/12/2014  . Type 2 diabetes mellitus with diabetic nephropathy (HCC) 11/28/2014  . Chronic systolic congestive heart failure (HCC) 11/28/2014  . Cirrhosis (HCC) 11/28/2014  . Generalized OA 11/28/2014  . S/P bilateral BKA (below knee amputation) (HCC) 11/28/2014    NAUMANN-HOUEGNIFIO,Riley Hallum PTA 04/09/2015, 1:47 PM   [facility] [address], [address] [place], [address] Phone: [phone]   Fax:  [phone]

## 2015-04-13 ENCOUNTER — Ambulatory Visit: Payer: No Typology Code available for payment source | Admitting: Physical Therapy

## 2015-04-13 ENCOUNTER — Encounter: Payer: Self-pay | Admitting: Physical Therapy

## 2015-04-13 DIAGNOSIS — M25669 Stiffness of unspecified knee, not elsewhere classified: Secondary | ICD-10-CM

## 2015-04-13 DIAGNOSIS — M25659 Stiffness of unspecified hip, not elsewhere classified: Secondary | ICD-10-CM

## 2015-04-13 DIAGNOSIS — R29898 Other symptoms and signs involving the musculoskeletal system: Secondary | ICD-10-CM | POA: Diagnosis not present

## 2015-04-13 DIAGNOSIS — M24669 Ankylosis, unspecified knee: Secondary | ICD-10-CM | POA: Diagnosis not present

## 2015-04-13 NOTE — Therapy (Signed)
[facility] [address], [address] [place], [place], [address] Phone: [phone]   Fax:  [phone]  Physical Therapy Treatment  Patient Details  Name: Sue Martin MRN: [id number] Date of Birth: September 06, 1965 Referring Provider: Kriste BasqueKim Hannah  Encounter Date: 04/13/2015      PT End of Session - 04/13/15 1027    Visit Number 15   Number of Visits 20   Date for PT Re-Evaluation 04/27/15   PT Start Time 1012   PT Stop Time 1059   PT Time Calculation (min) 47 min   Activity Tolerance Patient tolerated treatment well   Behavior [facility] for tasks assessed/performed      Past Medical History  Diagnosis Date  . Diabetes mellitus with renal complications (HCC)     PVD and retinopathy, S/p bilat ambutations  . CHF (congestive heart failure) (HCC)     sees Dr. Wynonia HazardKhawaja  . Chronic kidney disease   . Liver cirrhosis (HCC)   . Retinal detachment     sees Dr. Johna SheriffPeter Rogaski per her report  . Lyme disease   . Glaucoma   . CAD (coronary artery disease) 12/12/2014    -s/p 2V PCI of the LAD/RCA with taxus stents -cardiologist is Dr. Carmon SailsKwawaja, Celedonio SavageUsman   . Depression     Past Surgical History  Procedure Laterality Date  . Leg amputation below knee  09/09/2011, 09/11/2011  . Tubal ligation    . Lasik      There were no vitals filed for this visit.  Visit Diagnosis:  Weakness of both lower extremities  Decreased range of motion (ROM) of knee  Stiffness of hip joint, unspecified laterality      Subjective Assessment - 04/13/15 1020    Subjective I hurt my back this weekend can not perform bridges today   Currently in Pain? Yes   Pain Score 7   premedicated   Pain Location Back   Pain Orientation Right;Left   Pain Descriptors / Indicators Aching   Pain Type Chronic pain   Pain Onset More than a month ago   Pain Frequency Constant   Multiple Pain Sites No            [facility] PT Assessment - 04/13/15 0001     Assessment   Referring Provider Kriste BasqueKim Hannah                     [facility] Treatment/Exercise - 04/13/15 0001    Lumbar Exercises: Stretches   Single Knee to Chest Stretch 5 reps;Other (comment);Limitations   Single Knee to Chest Stretch Limitations uses a towel t oassist   Lumbar Exercises: Supine   Straight Leg Raise 20 reps   Other Supine Lumbar Exercises hip abduction bil 10 x2   against resistance by PTA   Knee/Hip Exercises: Supine   Quad Sets Strengthening;Right;Left;10 reps;2 sets  reminders not to hold breath and extend knees    Short Arc Quad Sets Strengthening;Both;3 sets  3x holding   Hip Adduction Isometric Right;Left;Both;10 reps  hold 5 sec ball bw knees   Other Supine Knee/Hip Exercises knees on large roll on blue mat with gluteal squeeze  pt with counting out loud to prevent holding the breath   Manual Therapy   Manual Therapy Passive ROM   Manual therapy comments PROM and prolonged hold (gentle) into knee extension                  PT Short Term  Goals - 04/13/15 1032    PT SHORT TERM GOAL #1   Title Be independent with home exercise program    Time 4   Period Weeks   Status Achieved   PT SHORT TERM GOAL #2   Title Decrease bil. knee pain by 25% with wheelchair <> bed transfers due to improved knee flexiblity    Time 4   Period Weeks   Status Achieved           PT Long Term Goals - 04/13/15 1034    PT LONG TERM GOAL #1   Title Be independent with advanced HEP.    Time 8   Period Weeks   Status On-going   PT LONG TERM GOAL #2   Title Improve hip flexion and knee extension strength to 4-/5 bilaterally to improve ease with mobility and transfers   Time 8   Period Weeks   Status On-going   PT LONG TERM GOAL #3   Title decrease bilateral knee pain by 40% with transfers due to improved flexiblity   Time 8   Period Weeks   Status On-going   PT LONG TERM GOAL #4   Title Caregiver verbalizes understanding of how to assist  with LE stretching techniques at home    Time 8   Period Weeks   Status Achieved   PT LONG TERM GOAL #5   Title Reduce FOTO limitation to < or = to 47%    Time 8   Period Weeks   Status On-going               Plan - 04/13/15 1028    Clinical Impression Statement Pt needed some sitting restbreak after transfer W/C to mat table due to back pain. Did not perform bridging sinc that can increase pain in lob back     Rehab Potential Good   PT Treatment/Interventions ADLs/Self Care Home Management;Therapeutic activities;Therapeutic exercise;Prosthetic Training;Cryotherapy;Electrical Stimulation;Neuromuscular re-education;Patient/family education;Passive range of motion;Compression bandaging;Energy conservation   PT Next Visit Plan Continue stretching, hip and knee strength   PT Home Exercise Plan progress as needed   Consulted and Agree with Plan of Care Patient        Problem List Patient Active Problem List   Diagnosis Date Noted  . Acute on chronic systolic heart failure (HCC) 02/21/2015  . Type II diabetes mellitus with ophthalmic manifestations, uncontrolled (HCC) 12/22/2014  . Hyperlipidemia 12/22/2014  . CAD (coronary artery disease) 12/12/2014  . Diabetic retinopathy (HCC) 12/12/2014  . PVD (peripheral vascular disease) (HCC) 12/12/2014  . CKD (chronic kidney disease) stage 3, GFR 30-59 ml/min 12/12/2014  . Type 2 diabetes mellitus with diabetic nephropathy (HCC) 11/28/2014  . Chronic systolic congestive heart failure (HCC) 11/28/2014  . Cirrhosis (HCC) 11/28/2014  . Generalized OA 11/28/2014  . S/P bilateral BKA (below knee amputation) (HCC) 11/28/2014    NAUMANN-HOUEGNIFIO,Britney Newstrom PTA 04/13/2015, 11:05 AM  [facility] [address] [place], [place], [address] Phone: [phone]   Fax:  [phone]  Name: Sue Martin MRN: [id number] Date of Birth: [birth year]

## 2015-04-13 NOTE — Telephone Encounter (Signed)
Needs to be rescheduled at first available appointment  

## 2015-04-15 NOTE — Telephone Encounter (Signed)
LM for pt to call back to reschedule fu

## 2015-04-16 ENCOUNTER — Ambulatory Visit: Payer: No Typology Code available for payment source | Admitting: Physical Therapy

## 2015-04-16 ENCOUNTER — Encounter: Payer: Self-pay | Admitting: Physical Therapy

## 2015-04-16 DIAGNOSIS — M25659 Stiffness of unspecified hip, not elsewhere classified: Secondary | ICD-10-CM | POA: Diagnosis not present

## 2015-04-16 DIAGNOSIS — M25669 Stiffness of unspecified knee, not elsewhere classified: Secondary | ICD-10-CM

## 2015-04-16 DIAGNOSIS — R29898 Other symptoms and signs involving the musculoskeletal system: Secondary | ICD-10-CM

## 2015-04-16 DIAGNOSIS — M24669 Ankylosis, unspecified knee: Secondary | ICD-10-CM | POA: Diagnosis not present

## 2015-04-16 NOTE — Therapy (Signed)
[facility] [address], [address] [place], [place], [address] Phone: [phone]   Fax:  [phone]  Physical Therapy Treatment  Patient Details  Name: Sue Martin MRN: [id number] Date of Birth: Jul 09, 1965 Referring Provider: Kriste Basque  Encounter Date: 04/16/2015      PT End of Session - 04/16/15 1036    Visit Number 16   Number of Visits 20   Date for PT Re-Evaluation 04/27/15   PT Start Time 1012   PT Stop Time 1059   PT Time Calculation (min) 47 min      Past Medical History  Diagnosis Date  . Diabetes mellitus with renal complications (HCC)     PVD and retinopathy, S/p bilat ambutations  . CHF (congestive heart failure) (HCC)     sees Dr. Wynonia Hazard  . Chronic kidney disease   . Liver cirrhosis (HCC)   . Retinal detachment     sees Dr. Johna Sheriff per her report  . Lyme disease   . Glaucoma   . CAD (coronary artery disease) 12/12/2014    -s/p 2V PCI of the LAD/RCA with taxus stents -cardiologist is Dr. Carmon Sails, Celedonio Savage   . Depression     Past Surgical History  Procedure Laterality Date  . Leg amputation below knee  09/09/2011, 09/11/2011  . Tubal ligation    . Lasik      There were no vitals filed for this visit.  Visit Diagnosis:  Weakness of both lower extremities  Decreased range of motion (ROM) of knee  Stiffness of hip joint, unspecified laterality      Subjective Assessment - 04/16/15 1041    Subjective My back feels better today rated as 5/10   Currently in Pain? Yes   Pain Score 5    Pain Location Back   Pain Orientation Right;Left   Pain Descriptors / Indicators Aching   Pain Type Chronic pain   Pain Onset More than a month ago   Pain Frequency Constant   Multiple Pain Sites No                         [facility] Adult PT Treatment/Exercise - 04/16/15 0001    Lumbar Exercises: Stretches   Single Knee to Chest Stretch 5 reps;Other (comment);Limitations   Single Knee  to Chest Stretch Limitations uses a towel t oassist   Lumbar Exercises: Supine   Bridge 5 reps   Other Supine Lumbar Exercises hip abduction bil 10 x2    Other Supine Lumbar Exercises unilateral D2 with yellow t-band 3 x 5   Knee/Hip Exercises: Supine   Quad Sets Strengthening;Right;Left;10 reps;2 sets  reminders not to hold breath   Short Arc Quad Sets Strengthening;Both;3 sets  3 sec holding   Hip Adduction Isometric Right;Left;Both;10 reps  hold 5 sec with blue ball   Other Supine Knee/Hip Exercises knees on large roll on blue mat with gluteal squeeze  pt counts out loud to prevent holding breath   Manual Therapy   Manual Therapy Passive ROM   Manual therapy comments PROM and prolonged hold (gentle) into knee extension                  PT Short Term Goals - 04/13/15 1032    PT SHORT TERM GOAL #1   Title Be independent with home exercise program    Time 4   Period Weeks   Status Achieved   PT SHORT TERM GOAL #2  Title Decrease bil. knee pain by 25% with wheelchair <> bed transfers due to improved knee flexiblity    Time 4   Period Weeks   Status Achieved           PT Long Term Goals - 04/13/15 1034    PT LONG TERM GOAL #1   Title Be independent with advanced HEP.    Time 8   Period Weeks   Status On-going   PT LONG TERM GOAL #2   Title Improve hip flexion and knee extension strength to 4-/5 bilaterally to improve ease with mobility and transfers   Time 8   Period Weeks   Status On-going   PT LONG TERM GOAL #3   Title decrease bilateral knee pain by 40% with transfers due to improved flexiblity   Time 8   Period Weeks   Status On-going   PT LONG TERM GOAL #4   Title Caregiver verbalizes understanding of how to assist with LE stretching techniques at home    Time 8   Period Weeks   Status Achieved   PT LONG TERM GOAL #5   Title Reduce FOTO limitation to < or = to 47%    Time 8   Period Weeks   Status On-going               Plan -  04/16/15 1042    Clinical Impression Statement Pt with good performance with exercises. Pt with good determination and focus on exercises. Pt will continue to benefit from skilled PT to improve strength bil LE to improve functional activities as transfer bed to W/C   Pt will benefit from skilled therapeutic intervention in order to improve on the following deficits Decreased range of motion;Obesity;Impaired vision/preception;Pain;Impaired flexibility;Decreased strength   Rehab Potential Good   PT Frequency 2x / week   PT Duration 8 weeks   PT Treatment/Interventions ADLs/Self Care Home Management;Therapeutic activities;Therapeutic exercise;Prosthetic Training;Cryotherapy;Electrical Stimulation;Neuromuscular re-education;Patient/family education;Passive range of motion;Compression bandaging;Energy conservation   PT Next Visit Plan continue stretching and strengthening hip and knee bil   Consulted and Agree with Plan of Care Patient        Problem List Patient Active Problem List   Diagnosis Date Noted  . Acute on chronic systolic heart failure (HCC) 02/21/2015  . Type II diabetes mellitus with ophthalmic manifestations, uncontrolled (HCC) 12/22/2014  . Hyperlipidemia 12/22/2014  . CAD (coronary artery disease) 12/12/2014  . Diabetic retinopathy (HCC) 12/12/2014  . PVD (peripheral vascular disease) (HCC) 12/12/2014  . CKD (chronic kidney disease) stage 3, GFR 30-59 ml/min 12/12/2014  . Type 2 diabetes mellitus with diabetic nephropathy (HCC) 11/28/2014  . Chronic systolic congestive heart failure (HCC) 11/28/2014  . Cirrhosis (HCC) 11/28/2014  . Generalized OA 11/28/2014  . S/P bilateral BKA (below knee amputation) (HCC) 11/28/2014    NAUMANN-HOUEGNIFIO,Jahmiya Guidotti PTA 04/16/2015, 11:00 AM  [facility] [address], [address] [place], [place], [address] Phone: [phone]   Fax:  [phone]  Name: Sue Martin MRN: [id number] Date  of Birth: 1966/03/19

## 2015-04-20 ENCOUNTER — Ambulatory Visit: Payer: No Typology Code available for payment source | Admitting: Physical Therapy

## 2015-04-20 ENCOUNTER — Encounter: Payer: Self-pay | Admitting: Physical Therapy

## 2015-04-20 DIAGNOSIS — M25669 Stiffness of unspecified knee, not elsewhere classified: Secondary | ICD-10-CM

## 2015-04-20 DIAGNOSIS — R29898 Other symptoms and signs involving the musculoskeletal system: Secondary | ICD-10-CM | POA: Diagnosis not present

## 2015-04-20 DIAGNOSIS — M25659 Stiffness of unspecified hip, not elsewhere classified: Secondary | ICD-10-CM

## 2015-04-20 DIAGNOSIS — M24669 Ankylosis, unspecified knee: Secondary | ICD-10-CM | POA: Diagnosis not present

## 2015-04-20 NOTE — Therapy (Signed)
[facility] [address], [address] [place], [place], [address] Phone: [phone]   Fax:  [phone]  Physical Therapy Treatment  Patient Details  Name: Sue Martin MRN: [id number] Date of Birth: May 13, 1966 Referring Provider: Kriste Basque  Encounter Date: 04/20/2015      PT End of Session - 04/20/15 0947    Visit Number 17   Number of Visits 20   Date for PT Re-Evaluation 04/27/15   PT Start Time 0940   PT Stop Time 1028   PT Time Calculation (min) 48 min   Activity Tolerance Patient tolerated treatment well   Behavior During Therapy [facility] for tasks assessed/performed      Past Medical History  Diagnosis Date  . Diabetes mellitus with renal complications (HCC)     PVD and retinopathy, S/p bilat ambutations  . CHF (congestive heart failure) (HCC)     sees Dr. Wynonia Hazard  . Chronic kidney disease   . Liver cirrhosis (HCC)   . Retinal detachment     sees Dr. Johna Sheriff per her report  . Lyme disease   . Glaucoma   . CAD (coronary artery disease) 12/12/2014    -s/p 2V PCI of the LAD/RCA with taxus stents -cardiologist is Dr. Carmon Sails, Celedonio Savage   . Depression     Past Surgical History  Procedure Laterality Date  . Leg amputation below knee  09/09/2011, 09/11/2011  . Tubal ligation    . Lasik      There were no vitals filed for this visit.  Visit Diagnosis:  Weakness of both lower extremities  Decreased range of motion (ROM) of knee  Stiffness of hip joint, unspecified laterality      Subjective Assessment - 04/20/15 0946    Subjective My back is as ussually and pain is rated as 6/10.    Currently in Pain? Yes   Pain Score 6    Pain Location Back   Pain Orientation Right;Left   Pain Descriptors / Indicators Aching   Pain Type Chronic pain   Pain Onset More than a month ago   Multiple Pain Sites No                         [facility] Adult PT Treatment/Exercise - 04/20/15 0001    Lumbar  Exercises: Stretches   Single Knee to Chest Stretch 5 reps;Other (comment);Limitations   Single Knee to Chest Stretch Limitations uses a towel t oassist   Lumbar Exercises: Supine   Straight Leg Raise 20 reps   Other Supine Lumbar Exercises hip abduction bil 10 x2    Other Supine Lumbar Exercises unilateral D2 with yellow t-band 3 x 5   Knee/Hip Exercises: Supine   Quad Sets Strengthening;Right;Left;10 reps;2 sets  3sec holding   Short Arc Quad Sets Strengthening;Both;3 sets  3sec holding   Hip Adduction Isometric Right;Left;Both;10 reps  with 5 sec hold   Other Supine Knee/Hip Exercises knees on large roll on blue mat with gluteal squeeze   Other Supine Knee/Hip Exercises hip abduction 2 x10 against resistance from PTA   Manual Therapy   Manual Therapy Passive ROM   Manual therapy comments PROM and prolonged hold (gentle) into knee extension                  PT Short Term Goals - 04/13/15 1032    PT SHORT TERM GOAL #1   Title Be independent with home exercise program    Time 4  Period Weeks   Status Achieved   PT SHORT TERM GOAL #2   Title Decrease bil. knee pain by 25% with wheelchair <> bed transfers due to improved knee flexiblity    Time 4   Period Weeks   Status Achieved           PT Long Term Goals - 04/20/15 [id number]    PT LONG TERM GOAL #1   Title Be independent with advanced HEP.    Time 8   Period Weeks   Status On-going   PT LONG TERM GOAL #2   Title Improve hip flexion and knee extension strength to 4-/5 bilaterally to improve ease with mobility and transfers   Time 8   Period Weeks   Status On-going   PT LONG TERM GOAL #3   Title decrease bilateral knee pain by 40% with transfers due to improved flexiblity   Time 8   Period Weeks   Status On-going   PT LONG TERM GOAL #4   Title Caregiver verbalizes understanding of how to assist with LE stretching techniques at home    Time 8   Period Weeks   Status Achieved   PT LONG TERM GOAL #5    Title Reduce FOTO limitation to < or = to 47%    Time 8   Period Weeks   Status On-going               Plan - 04/20/15 0948    Clinical Impression Statement Pt continues to show good determination with activities in Pt. Pt will continue to benefit from skilled PT to improve strength in bil LE and back to improve functional activities as transfers bet to W/C    Pt will benefit from skilled therapeutic intervention in order to improve on the following deficits Decreased range of motion;Obesity;Impaired vision/preception;Pain;Impaired flexibility;Decreased strength   Rehab Potential Good   Clinical Impairments Affecting Rehab Potential Bil. BKA   PT Frequency 2x / week   PT Duration 8 weeks   PT Treatment/Interventions ADLs/Self Care Home Management;Therapeutic activities;Therapeutic exercise;Prosthetic Training;Cryotherapy;Electrical Stimulation;Neuromuscular re-education;Patient/family education;Passive range of motion;Compression bandaging;Energy conservation   PT Next Visit Plan continue stretching and strengthening bil hip and knee    Consulted and Agree with Plan of Care Patient        Problem List Patient Active Problem List   Diagnosis Date Noted  . Acute on chronic systolic heart failure (HCC) 02/21/2015  . Type II diabetes mellitus with ophthalmic manifestations, uncontrolled (HCC) 12/22/2014  . Hyperlipidemia 12/22/2014  . CAD (coronary artery disease) 12/12/2014  . Diabetic retinopathy (HCC) 12/12/2014  . PVD (peripheral vascular disease) (HCC) 12/12/2014  . CKD (chronic kidney disease) stage 3, GFR 30-59 ml/min 12/12/2014  . Type 2 diabetes mellitus with diabetic nephropathy (HCC) 11/28/2014  . Chronic systolic congestive heart failure (HCC) 11/28/2014  . Cirrhosis (HCC) 11/28/2014  . Generalized OA 11/28/2014  . S/P bilateral BKA (below knee amputation) (HCC) 11/28/2014    NAUMANN-HOUEGNIFIO,Jazmene Racz PTA 04/20/2015, 5:25 PM  [facility] [address], [address] [place], [place], [address] Phone: [phone]   Fax:  [phone]  Name: Sue Martin MRN: [id number] Date of Birth: Oct 05, 1965

## 2015-04-23 ENCOUNTER — Ambulatory Visit: Payer: No Typology Code available for payment source

## 2015-04-23 DIAGNOSIS — R29898 Other symptoms and signs involving the musculoskeletal system: Secondary | ICD-10-CM

## 2015-04-23 DIAGNOSIS — M24669 Ankylosis, unspecified knee: Secondary | ICD-10-CM | POA: Diagnosis not present

## 2015-04-23 DIAGNOSIS — M25659 Stiffness of unspecified hip, not elsewhere classified: Secondary | ICD-10-CM

## 2015-04-23 DIAGNOSIS — M25669 Stiffness of unspecified knee, not elsewhere classified: Secondary | ICD-10-CM

## 2015-04-23 NOTE — Therapy (Signed)
[facility] [address], [address] [place], [place], [address] Phone: [phone]   Fax:  [phone]  Physical Therapy Treatment  Patient Details  Name: Sue Martin MRN: [id number] Date of Birth: 08-Apr-1966 Referring Provider: Kriste Basque  Encounter Date: 04/23/2015      PT End of Session - 04/23/15 1046    Visit Number 18   Number of Visits 20   Date for PT Re-Evaluation 04/27/15   PT Start Time 1014   PT Stop Time 1046   PT Time Calculation (min) 32 min   Activity Tolerance Patient limited by fatigue  session ended early due to breathing difficulty   Behavior During Therapy [facility] for tasks assessed/performed      Past Medical History  Diagnosis Date  . Diabetes mellitus with renal complications (HCC)     PVD and retinopathy, S/p bilat ambutations  . CHF (congestive heart failure) (HCC)     sees Dr. Wynonia Hazard  . Chronic kidney disease   . Liver cirrhosis (HCC)   . Retinal detachment     sees Dr. Johna Sheriff per her report  . Lyme disease   . Glaucoma   . CAD (coronary artery disease) 12/12/2014    -s/p 2V PCI of the LAD/RCA with taxus stents -cardiologist is Dr. Carmon Sails, Celedonio Savage   . Depression     Past Surgical History  Procedure Laterality Date  . Leg amputation below knee  09/09/2011, 09/11/2011  . Tubal ligation    . Lasik      There were no vitals filed for this visit.  Visit Diagnosis:  Weakness of both lower extremities  Decreased range of motion (ROM) of knee  Stiffness of hip joint, unspecified laterality      Subjective Assessment - 04/23/15 1018    Subjective Pt reports that she played Wii all day last night and is sore all over.  Pt rates the pain as 9/10 all over the body today.    Currently in Pain? Yes   Pain Score 9    Pain Location Back   Pain Orientation Right;Left   Pain Descriptors / Indicators Aching;Sore   Pain Type Chronic pain   Pain Onset More than a month ago   Pain  Frequency Constant   Aggravating Factors  sitting too long in chair   Pain Relieving Factors rest, laying down and doing exercises            [facility] PT Assessment - 04/23/15 0001    Assessment   Medical Diagnosis Pain in joint, lower leg, unspecified laterality (M25.569)   Onset Date/Surgical Date 01/04/14   Observation/Other Assessments   Focus on Therapeutic Outcomes (FOTO)  77% limitation   ROM / Strength   AROM / PROM / Strength AROM   AROM   Overall AROM  Deficits   Right Knee Extension -35   Left Knee Extension -30   PROM   Overall PROM  Deficits   Strength   Overall Strength Deficits   Strength Assessment Site Hip;Knee   Right Hip Flexion 4/5   Left Hip Flexion 4-/5   Right/Left Knee Left;Right   Right Knee Extension 4-/5   Left Knee Extension 4/5                     [facility] Adult PT Treatment/Exercise - 04/23/15 0001    Lumbar Exercises: Stretches   Single Knee to Chest Stretch 5 reps;Other (comment);Limitations   Single Knee to Chest Stretch Limitations uses  a towel to assist   Lumbar Exercises: Supine   Straight Leg Raise 20 reps   Other Supine Lumbar Exercises hip abduction bil 10 x2    Other Supine Lumbar Exercises unilateral D2 with yellow t-band 3 x 5   Knee/Hip Exercises: Supine   Quad Sets Strengthening;Right;Left;10 reps;2 sets  3sec holding   Short Arc Quad Sets Strengthening;Both;3 sets  3sec holding   Hip Adduction Isometric Right;Left;Both;10 reps  with 5 sec hold   Other Supine Knee/Hip Exercises knees on large roll on blue mat with gluteal squeeze                  PT Short Term Goals - 04/13/15 1032    PT SHORT TERM GOAL #1   Title Be independent with home exercise program    Time 4   Period Weeks   Status Achieved   PT SHORT TERM GOAL #2   Title Decrease bil. knee pain by 25% with wheelchair <> bed transfers due to improved knee flexiblity    Time 4   Period Weeks   Status Achieved           PT Long Term  Goals - 04/23/15 1032    PT LONG TERM GOAL #1   Title Be independent with advanced HEP.    Time 8   Period Weeks   Status On-going   PT LONG TERM GOAL #2   Title Improve hip flexion and knee extension strength to 4-/5 bilaterally to improve ease with mobility and transfers   Status Achieved               Plan - 04/23/15 1032    Clinical Impression Statement Pt with improved hip and knee strength/ROM since the start of care (see measures taken today).  Pt wth significant knee flexion contractures due to immobility.  Pt will attend 1 more session and bring her husband to finalize HEP for D/C next session.     Pt will benefit from skilled therapeutic intervention in order to improve on the following deficits Decreased range of motion;Obesity;Impaired vision/preception;Pain;Impaired flexibility;Decreased strength   Rehab Potential Good   PT Frequency 2x / week   PT Duration 8 weeks   PT Treatment/Interventions ADLs/Self Care Home Management;Therapeutic activities;Therapeutic exercise;Prosthetic Training;Cryotherapy;Electrical Stimulation;Neuromuscular re-education;Patient/family education;Passive range of motion;Compression bandaging;Energy conservation   PT Next Visit Plan 1 more session.  Report G-codes (FOTO done today) and D/C to HEP   Consulted and Agree with Plan of Care Patient        Problem List Patient Active Problem List   Diagnosis Date Noted  . Acute on chronic systolic heart failure (HCC) 02/21/2015  . Type II diabetes mellitus with ophthalmic manifestations, uncontrolled (HCC) 12/22/2014  . Hyperlipidemia 12/22/2014  . CAD (coronary artery disease) 12/12/2014  . Diabetic retinopathy (HCC) 12/12/2014  . PVD (peripheral vascular disease) (HCC) 12/12/2014  . CKD (chronic kidney disease) stage 3, GFR 30-59 ml/min 12/12/2014  . Type 2 diabetes mellitus with diabetic nephropathy (HCC) 11/28/2014  . Chronic systolic congestive heart failure (HCC) 11/28/2014  .  Cirrhosis (HCC) 11/28/2014  . Generalized OA 11/28/2014  . S/P bilateral BKA (below knee amputation) (HCC) 11/28/2014    Robt Okuda, PT  04/23/2015, 10:47 AM  [facility] [address] [place], [place], [address] Phone: [phone]   Fax:  [phone]  Name: Sue Martin MRN: [id number] Date of Birth: 1966/05/10

## 2015-04-27 ENCOUNTER — Ambulatory Visit: Payer: No Typology Code available for payment source

## 2015-04-27 DIAGNOSIS — M24669 Ankylosis, unspecified knee: Secondary | ICD-10-CM | POA: Diagnosis not present

## 2015-04-27 DIAGNOSIS — M25659 Stiffness of unspecified hip, not elsewhere classified: Secondary | ICD-10-CM | POA: Diagnosis not present

## 2015-04-27 DIAGNOSIS — R29898 Other symptoms and signs involving the musculoskeletal system: Secondary | ICD-10-CM

## 2015-04-27 DIAGNOSIS — M25669 Stiffness of unspecified knee, not elsewhere classified: Secondary | ICD-10-CM

## 2015-04-27 NOTE — Therapy (Signed)
[facility] [address] [place], [place], [address] Phone: [phone]   Fax:  [phone]  Physical Therapy Treatment  Patient Details  Name: Sue Martin MRN: [id number] Date of Birth: 08-17-1965 Referring Provider: Colin Benton  Encounter Date: 04/27/2015      PT End of Session - 04/27/15 1049    Visit Number 19   PT Start Time 1016   PT Stop Time 7939   PT Time Calculation (min) 33 min   Activity Tolerance Patient limited by fatigue  breathing difficulty   Behavior During Therapy [facility] for tasks assessed/performed      Past Medical History  Diagnosis Date  . Diabetes mellitus with renal complications (HCC)     PVD and retinopathy, S/p bilat ambutations  . CHF (congestive heart failure) ([place])     sees Dr. Novella Rob  . Chronic kidney disease   . Liver cirrhosis ([place])   . Retinal detachment     sees Dr. Rose Phi per her report  . Lyme disease   . Glaucoma   . CAD (coronary artery disease) 12/12/2014    -s/p 2V PCI of the LAD/RCA with taxus stents -cardiologist is Dr. Carlis Abbott, Darcel Bayley   . Depression     Past Surgical History  Procedure Laterality Date  . Leg amputation below knee  09/09/2011, 09/11/2011  . Tubal ligation    . Lasik      There were no vitals filed for this visit.  Visit Diagnosis:  Weakness of both lower extremities  Decreased range of motion (ROM) of knee  Stiffness of hip joint, unspecified laterality      Subjective Assessment - 04/27/15 1018    Subjective Ready for D/C to HEP   Pertinent History bilateral below knee ampuations   Currently in Pain? Yes   Pain Score 5    Pain Location Knee   Pain Orientation Right;Left   Pain Descriptors / Indicators Aching;Sore   Pain Type Chronic pain   Pain Onset More than a month ago   Pain Frequency Constant   Aggravating Factors  sitting too long in chair   Pain Relieving Factors rest, laying down and doing exercises             [facility] PT Assessment - 04/27/15 0001    Assessment   Medical Diagnosis Pain in joint, lower leg, unspecified laterality (M25.569)   Onset Date/Surgical Date 01/04/14   Observation/Other Assessments   Focus on Therapeutic Outcomes (FOTO)  77% limitation   ROM / Strength   AROM / PROM / Strength AROM   AROM   Overall AROM  Deficits   Right Knee Extension -35   Left Knee Extension -30   Strength   Overall Strength Deficits   Strength Assessment Site Hip;Knee   Right Hip Flexion 4/5   Left Hip Flexion 4-/5   Right/Left Knee Right;Left   Right Knee Extension 4-/5   Left Knee Extension 4/5                     [facility] Adult PT Treatment/Exercise - 04/27/15 0001    Lumbar Exercises: Stretches   Single Knee to Chest Stretch 5 reps;Other (comment);Limitations   Single Knee to Chest Stretch Limitations uses a towel to assist   Lumbar Exercises: Supine   Straight Leg Raise 20 reps   Other Supine Lumbar Exercises hip abduction bil 10 x2    Knee/Hip Exercises: Supine   Quad Sets Strengthening;Right;Left;10 reps;2 sets  3sec  Designer, fashion/clothing Strengthening;Both;3 sets  3sec holding   Hip Adduction Isometric Right;Left;Both;10 reps  with 5 sec hold   Other Supine Knee/Hip Exercises knees on large roll on blue mat with gluteal squeeze                  PT Short Term Goals - 04/13/15 1032    PT SHORT TERM GOAL #1   Title Be independent with home exercise program    Time 4   Period Weeks   Status Achieved   PT SHORT TERM GOAL #2   Title Decrease bil. knee pain by 25% with wheelchair <> bed transfers due to improved knee flexiblity    Time 4   Period Weeks   Status Achieved           PT Long Term Goals - May 15, 2015 1024    PT LONG TERM GOAL #1   Title Be independent with advanced HEP.    Status Achieved   PT LONG TERM GOAL #2   Title Improve hip flexion and knee extension strength to 4-/5 bilaterally to improve ease with mobility  and transfers   Status Achieved   PT LONG TERM GOAL #3   Title decrease bilateral knee pain by 40% with transfers due to improved flexiblity   Status Partially Met   PT LONG TERM GOAL #4   Title Caregiver verbalizes understanding of how to assist with LE stretching techniques at home    Status Achieved   PT LONG TERM GOAL #5   Title Reduce FOTO limitation to < or = to 47%    Status Not Met               Plan - 2015/05/15 1024    Clinical Impression Statement Pt with improved hip and knee strength/ROM since the start of care (see measures).  Pt with significant knee flexion contractures due to immobility.  Pt is independent in final HEP for flexibility and knee strength.  Pt will D/C to HEP.   PT Next Visit Plan D/C PT to HEP   Consulted and Agree with Plan of Care Patient          G-Codes - 05-15-15 1023    Functional Assessment Tool Used FOTO: 77% limitation and clinical   Functional Limitation Mobility: Walking and moving around   Mobility: Walking and Moving Around Goal Status [phone]) At least 40 percent but less than 60 percent impaired, limited or restricted   Mobility: Walking and Moving Around Discharge Status [phone]) At least 60 percent but less than 80 percent impaired, limited or restricted      Problem List Patient Active Problem List   Diagnosis Date Noted  . Acute on chronic systolic heart failure ([place]) 02/21/2015  . Type II diabetes mellitus with ophthalmic manifestations, uncontrolled ([place]) 12/22/2014  . Hyperlipidemia 12/22/2014  . CAD (coronary artery disease) 12/12/2014  . Diabetic retinopathy ([place]) 12/12/2014  . PVD (peripheral vascular disease) ([place]) 12/12/2014  . CKD (chronic kidney disease) stage 3, GFR 30-59 ml/min 12/12/2014  . Type 2 diabetes mellitus with diabetic nephropathy ([place]) 11/28/2014  . Chronic systolic congestive heart failure ([place]) 11/28/2014  . Cirrhosis ([place]) 11/28/2014  . Generalized OA 11/28/2014  . S/P bilateral BKA (below  knee amputation) ([place]) 11/28/2014   PHYSICAL THERAPY DISCHARGE SUMMARY  Visits from Start of Care: 19  Current functional level related to goals / functional outcomes: Pt with knee flexion contractures of a chronic nature.  See above  for goal status.   Remaining deficits: See above.     Education / Equipment: HEP Plan: Patient agrees to discharge.  Patient goals were partially met. Patient is being discharged due to being pleased with the current functional level.  ?????     TAKACS,KELLY, PT 04/27/2015, 10:50 AM  [facility] [address], [place], [place], [address] Phone: [phone]   Fax:  [phone]  Name: Sue Martin MRN: [id number] Date of Birth: [birth year]

## 2015-05-07 ENCOUNTER — Ambulatory Visit (INDEPENDENT_AMBULATORY_CARE_PROVIDER_SITE_OTHER): Payer: PRIVATE HEALTH INSURANCE | Admitting: Family Medicine

## 2015-05-07 ENCOUNTER — Encounter: Payer: Self-pay | Admitting: Family Medicine

## 2015-05-07 VITALS — BP 120/82 | HR 69 | Temp 98.6°F | Ht <= 58 in

## 2015-05-07 DIAGNOSIS — E1165 Type 2 diabetes mellitus with hyperglycemia: Secondary | ICD-10-CM

## 2015-05-07 DIAGNOSIS — E785 Hyperlipidemia, unspecified: Secondary | ICD-10-CM | POA: Diagnosis not present

## 2015-05-07 DIAGNOSIS — E11319 Type 2 diabetes mellitus with unspecified diabetic retinopathy without macular edema: Secondary | ICD-10-CM | POA: Diagnosis not present

## 2015-05-07 DIAGNOSIS — N183 Chronic kidney disease, stage 3 unspecified: Secondary | ICD-10-CM

## 2015-05-07 DIAGNOSIS — E1121 Type 2 diabetes mellitus with diabetic nephropathy: Secondary | ICD-10-CM

## 2015-05-07 DIAGNOSIS — Z89511 Acquired absence of right leg below knee: Secondary | ICD-10-CM

## 2015-05-07 DIAGNOSIS — I5023 Acute on chronic systolic (congestive) heart failure: Secondary | ICD-10-CM

## 2015-05-07 DIAGNOSIS — I5022 Chronic systolic (congestive) heart failure: Secondary | ICD-10-CM

## 2015-05-07 DIAGNOSIS — I25810 Atherosclerosis of coronary artery bypass graft(s) without angina pectoris: Secondary | ICD-10-CM

## 2015-05-07 DIAGNOSIS — I739 Peripheral vascular disease, unspecified: Secondary | ICD-10-CM

## 2015-05-07 DIAGNOSIS — I2510 Atherosclerotic heart disease of native coronary artery without angina pectoris: Secondary | ICD-10-CM

## 2015-05-07 DIAGNOSIS — K7460 Unspecified cirrhosis of liver: Secondary | ICD-10-CM

## 2015-05-07 DIAGNOSIS — Z794 Long term (current) use of insulin: Secondary | ICD-10-CM

## 2015-05-07 DIAGNOSIS — Z89512 Acquired absence of left leg below knee: Secondary | ICD-10-CM

## 2015-05-07 NOTE — Progress Notes (Addendum)
HPI:  DM: -complications: s/p bilat LE amputation and on disability for this - reports spent 45 days at [facility] in 2012 for gangrene, PVD, Renal Failure, diabetic retinopathy -seeing endocrinology -sees optho/legally blind: sees Dr Johna Sheriff  CKD/hypokalemia: -sees nephrology -reports her potassium is always low - has required potassium supplementation in the past but she does not tolerate these well and refuses to take -rarely uses ibuprofen  Hx of CAD, CHF: -S/p stenting Jan of 2007 [facility] in [place], [place] -sees cardiologist in [place] now Dr. Wynonia Hazard  -reports saw cards recently and echo showed EF 31%, reports told she should see pulmonology for her chronic DOE -meds: asa, plavix, lipitor, lisinopril, lasix, metalazone, spironolactone, (coreg 3.125 bid per cardiology notes)  OA: -knees and back -takes ibuprofen rarely for this  Liver cirrhosis: -reports diagnosed in 2013, reports told this was from her diabetes -reports had extensive testing for hepatitis and does not have hepatitis  Chronic allergies: -takes benadryl at night -reports will not use nose sprays -chronic nasal congestion and fullness and popping in ears, wants to see ENT  ROS: See pertinent positives and negatives per HPI.  Past Medical History  Diagnosis Date  . Diabetes mellitus with renal complications (HCC)     PVD and retinopathy, S/p bilat ambutations  . CHF (congestive heart failure) (HCC)     sees Dr. Wynonia Hazard  . Chronic kidney disease   . Liver cirrhosis (HCC)   . Retinal detachment     sees Dr. Johna Sheriff per her report  . Lyme disease   . Glaucoma   . CAD (coronary artery disease) 12/12/2014    -s/p 2V PCI of the LAD/RCA with taxus stents -cardiologist is Dr. Carmon Sails, Celedonio Savage   . Depression   . 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 Leg amputation below knee  09/09/2011,  09/11/2011  . Tubal ligation    . Lasik      Family History  Problem Relation Age of Onset  . Arthritis Mother   . Heart disease Mother   . Mental illness Mother   . Diabetes Mother   . Arthritis Maternal Grandmother   . Diabetes Maternal Grandmother   . Arthritis Father   . CVA Father   . Hypertension Father   . Sudden death Paternal Grandfather     Social History   Social History  . Marital Status: Married    Spouse Name: N/A  . Number of Children: N/A  . Years of Education: N/A   Social History Main Topics  . Smoking status: Never Smoker   . Smokeless tobacco: None  . Alcohol Use: No  . Drug Use: No  . Sexual Activity: Not Asked   Other Topics Concern  . None   Social History Narrative   Married   Husband is a Naval architect        Current outpatient prescriptions:  .  aspirin 325 MG tablet, Take 325 mg by mouth at bedtime. , Disp: , Rfl:  .  atorvastatin (LIPITOR) 10 MG tablet, Take 10 mg by mouth daily at 6 PM. , Disp: , Rfl:  .  carvedilol (COREG) 3.125 MG tablet, Take 3.125 mg by mouth 2 (two) times daily with a meal., Disp: , Rfl:  .  cefpodoxime (VANTIN) 200 MG tablet, Take 1 tablet (200 mg total) by mouth 2 (two) times daily., Disp: 12 tablet, Rfl: 0 .  clopidogrel (  PLAVIX) 75 MG tablet, Take 75 mg by mouth at bedtime. , Disp: , Rfl:  .  cyclobenzaprine (FLEXERIL) 10 MG tablet, Take 10 mg by mouth 3 (three) times daily as needed for muscle spasms. , Disp: , Rfl:  .  fexofenadine (ALLEGRA) 60 MG tablet, Take 1 tablet (60 mg total) by mouth 2 (two) times daily., Disp: 30 tablet, Rfl: 0 .  glucose blood test strip, Check sugars four times daily   Dx E11.9, Disp: , Rfl:  .  Insulin Glargine (LANTUS SOLOSTAR) 100 UNIT/ML Solostar Pen, Inject 40-55 Units into the skin 2 (two) times daily. 55 units every morning and 40 units every evening., Disp: , Rfl:  .  insulin lispro (HUMALOG) 100 UNIT/ML KiwkPen, Inject 25 units three times a day with meals, Disp: 30 mL,  Rfl: 3 .  lisinopril (PRINIVIL,ZESTRIL) 10 MG tablet, Take 10 mg by mouth at bedtime. , Disp: , Rfl:  .  metolazone (ZAROXOLYN) 5 MG tablet, Take by mouth daily. 30 minutes before Demadex dose, Disp: , Rfl:  .  ondansetron (ZOFRAN) 4 MG tablet, Take 1 tablet (4 mg total) by mouth every 8 (eight) hours as needed for nausea or vomiting., Disp: 20 tablet, Rfl: 0 .  torsemide (DEMADEX) 20 MG tablet, Take 1 tablet (20 mg total) by mouth 2 (two) times daily., Disp: 60 tablet, Rfl: 0  EXAM:  Filed Vitals:   05/07/15 0943  BP: 120/82  Pulse: 69  Temp: 98.6 F (37 C)    There is no weight on file to calculate BMI.  GENERAL: vitals reviewed and listed above, alert, oriented, appears well hydrated and in no acute distress  HEENT: atraumatic, conjunttiva clear, no obvious abnormalities on inspection of external nose and ears  NECK: no obvious masses on inspection  LUNGS: clear to auscultation bilaterally, no wheezes, rales or rhonchi, good air movement  CV: HRRR, no peripheral edema  MS: moves all extremities without noticeable abnormality, in wheelchair, bilateral BKA  PSYCH: pleasant and cooperative, no obvious depression or anxiety  ASSESSMENT AND PLAN:  Discussed the following assessment and plan:  Uncontrolled type 2 diabetes mellitus with retinopathy, without long-term current use of insulin, macular edema presence unspecified, unspecified retinopathy severity (HCC)  CKD (chronic kidney disease) stage 3, GFR 30-59 ml/min  Acute on chronic systolic heart failure (HCC)  Hyperlipidemia  Coronary artery disease involving native coronary artery of native heart without angina pectoris  Chronic systolic congestive heart failure (HCC)  PVD (peripheral vascular disease) (HCC)  Type 2 diabetes mellitus with diabetic nephropathy, with long-term current use of insulin (HCC)  Cirrhosis of liver without ascites, unspecified hepatic cirrhosis type (HCC)  S/P bilateral BKA (below knee  amputation) (HCC)  -advised assistant to obtain cardiology OV notes - will place referral to pulm once reviewed if her cardiologist feels is needed though feel her DOE is most likely related to her cardiac issues, immobility, posture and obesity (O2 96% at rest) -advised pap and she prefers to do with gyn - numbers provided for several gyn offices and advised to schedule -she agrees to schedule follow up with her endocrinologist regarding her diabetes -numbers provided for ENT per her request, she refuses to use nasal sprays for her ETD reports nothing else work -Patient advised to return or notify a doctor immediately if symptoms worsen or persist or new concerns arise.  Obtained cardiology notes. EF 35 % on echo and unchanged per notes. No mention of pulm referral needed. Assistant to notify pt.  Patient  Instructions  BEFORE YOU LEAVE: -schedule follow up in 4-6 months  Call the numbers provided to set up a gynecology/pap exam  Follow up with your endocrinologist regarding your diabetes - call today to set up appointment     [name], Damita Lack.

## 2015-05-07 NOTE — Patient Instructions (Addendum)
BEFORE YOU LEAVE: -schedule follow up in 4-6 months  Call the numbers provided to set up a gynecology/pap exam  Follow up with your endocrinologist regarding your diabetes - call today to set up appointment

## 2015-05-07 NOTE — Progress Notes (Signed)
Pre visit review using our clinic review tool, if applicable. No additional management support is needed unless otherwise documented below in the visit note. 

## 2015-05-28 ENCOUNTER — Other Ambulatory Visit: Payer: Self-pay | Admitting: *Deleted

## 2015-05-28 NOTE — Telephone Encounter (Signed)
Rx request denied per Dr Selena BattenKim as the pt should contact her cardiologist.

## 2015-06-02 ENCOUNTER — Ambulatory Visit (INDEPENDENT_AMBULATORY_CARE_PROVIDER_SITE_OTHER): Payer: Medicare Other | Admitting: Family Medicine

## 2015-06-02 ENCOUNTER — Encounter: Payer: Self-pay | Admitting: Family Medicine

## 2015-06-02 ENCOUNTER — Telehealth: Payer: Self-pay | Admitting: *Deleted

## 2015-06-02 VITALS — BP 124/78 | HR 86 | Temp 98.3°F | Ht <= 58 in

## 2015-06-02 DIAGNOSIS — Z89511 Acquired absence of right leg below knee: Secondary | ICD-10-CM

## 2015-06-02 DIAGNOSIS — N183 Chronic kidney disease, stage 3 unspecified: Secondary | ICD-10-CM

## 2015-06-02 DIAGNOSIS — Z89512 Acquired absence of left leg below knee: Secondary | ICD-10-CM | POA: Diagnosis not present

## 2015-06-02 DIAGNOSIS — I25810 Atherosclerosis of coronary artery bypass graft(s) without angina pectoris: Secondary | ICD-10-CM

## 2015-06-02 DIAGNOSIS — I5022 Chronic systolic (congestive) heart failure: Secondary | ICD-10-CM | POA: Diagnosis not present

## 2015-06-02 MED ORDER — TORSEMIDE 20 MG PO TABS: 20.0000 mg | ORAL_TABLET | Freq: Two times a day (BID) | ORAL | Status: DC

## 2015-06-02 MED ORDER — METOLAZONE 5 MG PO TABS: 5.0000 mg | ORAL_TABLET | Freq: Every day | ORAL | Status: DC

## 2015-06-02 NOTE — Telephone Encounter (Signed)
I called the pt and informed her of the message below and she insisted that she is coming in to see Dr Selena BattenKim today as she needs to have the Torsemide adjusted.  I offered to call the cardiologist for her to let them know this is what Dr Selena BattenKim recommends and she stated she was told they do not have any appts coming up, again I offered to call their office for her as we may not be able to help with her problems.  I advised her I would relay this to Dr Selena BattenKim.

## 2015-06-02 NOTE — Progress Notes (Signed)
HPI:  Sue Martin Sees Dr. Ninfa Linden (cardiology) and [name] [facility] CHF, CKD, hypokalemia.She is here today for medication refill issues. On odd combo of medications demadex  bid and zaroxolyn  per cardiologist and nephrology notes for fluid balance. Has significant hypokalemia frequently but refuses any potassium replacement (thinks is allergic to all forms as reports feels nausea when takes.) Echo with Dr. Ninfa Linden 02/2015 with EF 35%. Is supposed to wear compression on bilat stumps per notes but does not. Report pharmacy told her did not get refills from [place]. We received request for doubled dose of diuretic and advised she discuss with cardiologist given issues with hypokal. She reports she did not request increased dose - just wanted refill and pharmacy denied so she has been out of medications for several days. Breathing stable. She has noticed some increased swelling in stumps.no CP or palpitations.  ROS: See pertinent positives and negatives per HPI.  Past Medical History  Diagnosis Date  . Diabetes mellitus with renal complications (HCC)     PVD and retinopathy, S/p bilat ambutations  . CHF (congestive heart failure) (HCC)     sees Dr. Wynonia Hazard  . Chronic kidney disease   . Liver cirrhosis (HCC)   . Retinal detachment     sees Dr. Johna Sheriff per her report  . Lyme disease   . Glaucoma   . CAD (coronary artery disease) 12/12/2014    -s/p 2V PCI of the LAD/RCA with taxus stents -cardiologist is Dr. Carmon Sails, [facility]   . Depression   . 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 Leg amputation below knee  09/09/2011, 09/11/2011  . Tubal ligation    . Lasik      Family History  Problem Relation Age of Onset  . Arthritis Mother   . Heart disease Mother   . Mental illness Mother   . Diabetes Mother   . Arthritis Maternal Grandmother   . Diabetes Maternal Grandmother   . Arthritis Father   . CVA Father    . Hypertension Father   . Sudden death Paternal Grandfather     Social History   Social History  . Marital Status: Married    Spouse Name: N/A  . Number of Children: N/A  . Years of Education: N/A   Social History Main Topics  . Smoking status: Never Smoker   . Smokeless tobacco: None  . Alcohol Use: No  . Drug Use: No  . Sexual Activity: Not Asked   Other Topics Concern  . None   Social History Narrative   Married   Husband is a Naval architect        Current outpatient prescriptions:  .  aspirin 325 MG tablet, Take 325 mg by mouth at bedtime. , Disp: , Rfl:  .  atorvastatin (LIPITOR) 10 MG tablet, Take 10 mg by mouth daily at 6 PM. , Disp: , Rfl:  .  carvedilol (COREG) 3.125 MG tablet, Take 3.125 mg by mouth 2 (two) times daily with a meal., Disp: , Rfl:  .  cefpodoxime (VANTIN) 200 MG tablet, Take 1 tablet (200 mg total) by mouth 2 (two) times daily., Disp: 12 tablet, Rfl: 0 .  clopidogrel (PLAVIX) 75 MG tablet, Take 75 mg by mouth at bedtime. , Disp: , Rfl:  .  cyclobenzaprine (FLEXERIL) 10 MG tablet, Take 10 mg by mouth 3 (three) times daily as needed for muscle spasms. , Disp: , Rfl:  .  fexofenadine (ALLEGRA) 60 MG tablet, Take 1 tablet (60 mg total) by mouth 2 (two) times daily., Disp: 30 tablet, Rfl: 0 .  glucose blood test strip, Check sugars four times daily   Dx E11.9, Disp: , Rfl:  .  Insulin Glargine (LANTUS SOLOSTAR) 100 UNIT/ML Solostar Pen, Inject 40-55 Units into the skin 2 (two) times daily. 55 units every morning and 40 units every evening., Disp: , Rfl:  .  insulin lispro (HUMALOG) 100 UNIT/ML KiwkPen, Inject 25 units three times a day with meals, Disp: 30 mL, Rfl: 3 .  lisinopril (PRINIVIL,ZESTRIL) 10 MG tablet, Take 10 mg by mouth at bedtime. , Disp: , Rfl:  .  metolazone (ZAROXOLYN) 5 MG tablet, Take 1 tablet (5 mg total) by mouth daily. 30 minutes before Demadex dose, Disp: 90 tablet, Rfl: 3 .  ondansetron (ZOFRAN) 4 MG tablet, Take 1 tablet (4 mg  total) by mouth every 8 (eight) hours as needed for nausea or vomiting., Disp: 20 tablet, Rfl: 0 .  torsemide (DEMADEX) 20 MG tablet, Take 1 tablet (20 mg total) by mouth 2 (two) times daily., Disp: 180 tablet, Rfl: 3  EXAM:  Filed Vitals:   06/02/15 1001  BP: 124/78  Pulse: 86  Temp: 98.3 F (36.8 C)    There is no weight on file to calculate BMI.  GENERAL: vitals reviewed and listed above, alert, oriented, appears well hydrated and in no acute distress  HEENT: atraumatic, conjunttiva clear, no obvious abnormalities on inspection of external nose and ears  NECK: no JVD, no obvious masses on inspection  LUNGS: clear to auscultation bilaterally, no wheezes, rales or rhonchi, good air movement, no signs of resp distress, O2 96 % on RA  CV: HRRR, bilat edema stumps  MS: moves all extremities without noticeable abnormality  PSYCH: pleasant and cooperative, no obvious depression or anxiety  ASSESSMENT AND PLAN:  Discussed the following assessment and plan:  Chronic systolic congestive heart failure (HCC)  S/P bilateral BKA (below knee amputation) (HCC)  CKD (chronic kidney disease) stage 3, GFR 30-59 ml/min  -refilled medications -advised follow up with specialists if fluid issues persist, worsening symptoms of CHF, breathing issues -follow up in 3-4 months -advised compression socks -Patient advised to return or notify a doctor immediately if symptoms worsen or persist or new concerns arise.  Patient Instructions  Refills provided - please fill today  Please follow up with your cardiologist and/or nephrologist if any further fluid balance issues despite your normal medications     Sinan Tuch R.

## 2015-06-02 NOTE — Patient Instructions (Signed)
Refills provided - please fill today  Please follow up with your cardiologist and/or nephrologist if any further fluid balance issues despite your normal medications

## 2015-06-02 NOTE — Progress Notes (Signed)
Pre visit review using our clinic review tool, if applicable. No additional management support is needed unless otherwise documented below in the visit note. 

## 2015-06-02 NOTE — Telephone Encounter (Signed)
-----   Message from Terressa KoyanagiHannah R [name], DO sent at 06/02/2015  6:22 AM EST ----- On my schedule today for CHF - please advise her to call her cardiologist. She has complicated heart issues and she needs to see her cardiologist for this. Thanks.

## 2015-06-11 ENCOUNTER — Encounter: Payer: Self-pay | Admitting: Physical Therapy

## 2015-06-11 ENCOUNTER — Ambulatory Visit: Payer: No Typology Code available for payment source | Attending: Family Medicine | Admitting: Physical Therapy

## 2015-06-11 DIAGNOSIS — Z89512 Acquired absence of left leg below knee: Secondary | ICD-10-CM | POA: Insufficient documentation

## 2015-06-11 DIAGNOSIS — Z89511 Acquired absence of right leg below knee: Secondary | ICD-10-CM | POA: Diagnosis not present

## 2015-06-11 NOTE — Therapy (Signed)
[facility] [address] [place], [place], [address] Phone: [phone]   Fax:  [phone]  Physical Therapy Evaluation  Patient Details  Name: Sue Martin MRN: [id number] Date of Birth: January 16, 1966 Referring Provider: Colin Benton  Encounter Date: 06/11/2015      PT End of Session - 06/11/15 1436    Visit Number 1   Number of Visits 1   PT Start Time 1561   PT Stop Time 5379   PT Time Calculation (min) 67 min      Past Medical History  Diagnosis Date  . Diabetes mellitus with renal complications (HCC)     PVD and retinopathy, S/p bilat ambutations  . CHF (congestive heart failure) ([place])     sees Dr. Novella Rob  . Chronic kidney disease   . Liver cirrhosis ([place])   . Retinal detachment     sees Dr. Rose Phi per her report  . Lyme disease   . Glaucoma   . CAD (coronary artery disease) 12/12/2014    -s/p 2V PCI of the LAD/RCA with taxus stents -cardiologist is Dr. Carlis Abbott, [facility]   . Depression   . S/P bilateral BKA (below knee amputation) ([place]) 11/28/2014    Past Surgical History  Procedure Laterality Date  . Leg amputation below knee  09/09/2011, 09/11/2011  . Tubal ligation    . Lasik      There were no vitals filed for this visit.  Visit Diagnosis:  S/P BKA (below knee amputation) bilateral [facility]) - Plan: PT plan of care cert/re-cert          Mobility/Seating Evaluation    PATIENT INFORMATION: Name: Sue Martin DOB: 09-06-1965  Sex: F Date seen: 06-11-15 Time: 1315  Address:  [address], [place] [address] Physician: Colin Benton, DO This evaluation/justification form will serve as the LMN for the following suppliers: __________________________ Supplier: [employer] Contact Person: Casper Harrison Phone:  [phone]   Seating Therapist: Guido Sander, PT Phone:   [phone]   Phone: [phone] (cell)     Spouse/Parent/Caregiver name:  ?????  Phone number: ????? Insurance/Payer: [facility]/Medicare/Medicaid     Reason for Referral: power wheelchair evaluation  Patient/Caregiver Goals: obtain new power wheelchair  Patient was seen for face-to-face evaluation for new power wheelchair.  Also present was [employer] to discuss recommendations and wheelchair options.  Further paperwork was completed and sent to vendor.  Patient appears to qualify for power mobility device at this time per objective findings.   MEDICAL HISTORY: Diagnosis: Primary Diagnosis: S/P  bil. BKA Onset: LLE 09-09-11;  RLE 09-11-11 Diagnosis: chronic systolic CHF;  CAD 5-43-60:  Chronic kidney disease;  DM with renal complications;  s/p Lyme Disease March 2013:  Glaucoma   _0 Progressive Disease Relevant past and future surgeries: bil. BKA  March 2013   Height: 55" Weight: 260# Explain recent changes or trends in weight: ?????   History including Falls: None    HOME ENVIRONMENT: _1 House  _2 Condo/town home  _3 Apartment  _4 Assisted Living    _5 Lives Alone _6  Lives with Others  Hours with caregiver: ?????  _0 Home is accessible to patient           Stairs      _1 Yes _2  No     Ramp _3 Yes _4 No Comments:  Elevator   COMMUNITY ADL: TRANSPORTATION: _5 Car    _6 Van    <BCWUGQBVQXIHWTUU>_8<\/KCMKLKJZPHXTAVWP>_7 Public Transportation    _8 Adapted w/c Lift    _9 Ambulance    _10 Other:       _11 Sits in wheelchair during transport  Employment/School: ????? Specific requirements pertaining to mobility ?????  Other: ?????    FUNCTIONAL/SENSORY PROCESSING SKILLS:  Handedness:   _12 Right     _13 Left    _14 NA  Comments:  ?????  Functional Processing Skills for Wheeled Mobility _15 Processing Skills are adequate for safe wheelchair operation  Areas of concern than may interfere with safe operation of wheelchair Description of problem   _16  Attention to environment      _17 Judgment      _18  Hearing  _19  Vision  or visual processing      _20 Motor Planning  _21  Fluctuations in Behavior  ?????    VERBAL COMMUNICATION: _22 WFL receptive _23  WFL expressive _24 Understandable  _25 Difficult to understand  _26 non-communicative _27  Uses an augmented communication device  CURRENT SEATING / MOBILITY: Current Mobility Base:  _28 None _29 Dependent _30 Manual _31 Scooter _32 Power  Type of Control: ?????  Manufacturer:  [employer] 20x18Size:  ?????Age: ?????  Current Condition of Mobility Base:  H[employer] - pt purchased this wheelchair on her own   Current Wheelchair components:  ?????  Describe posture in present seating system:  ?????      SENSATION and SKIN ISSUES: Sensation _33 Intact  _34 Impaired _35 Absent  Level of sensation: ????? Pressure Relief: Able to perform effective pressure relief :    _36 Yes  _37  No Method: attempts to perform by leaning laterally and forward If not, Why?: ?????  Skin Issues/Skin Integrity Current Skin Issues  _38 Yes _39 No _40 Intact _41  Red area_42  Open Area  _43 Scar Tissue _44 At risk from prolonged sitting Where  ?????  History of Skin Issues  _45 Yes _46 No Where  on bil. ischiums with > on L than R When  2 weeks ago  Hx of skin flap surgeries  _47 Yes _48 No Where  ????? When  ?????  Limited sitting tolerance _49 Yes _50 No Hours spent sitting in wheelchair daily: 8+  Complaint of Pain:  Please describe: pt reports pain all over body - due to arthritis/fibromyalgia per pt report; pt rates pain 8/10 at this time   Swelling/Edema: pt has edema throughout body due to CHF and due to chronic kidney disease   ADL STATUS (in reference to wheelchair use):  Indep Assist Unable Indep with Equip Not assessed Comments  Dressing X ????? ????? ????? ????? performs from bed  Eating X ????? ????? ????? ????? ?????  Toileting ????? ????? ????? X ????? uses power wheelchair; uses bedside commode at home  Bathing ????? X ????? X ????? uses tub transfer bench  Grooming/Hygiene ????? ????? ????? X ????? performs  from wheelchair  Meal Prep ????? X ????? X ????? ability varies - needs assistance with use of stove  IADLS ????? X ????? X ????? unable to reach items without assistance  Bowel Management: _51 Continent  _52 Incontinent  _53 Accidents Comments:  ?????  Bladder Management: _54 Continent  _55 Incontinent  _56 Accidents Comments:  ?????     WHEELCHAIR SKILLS: Manual w/c Propulsion: _57 UE or LE strength and endurance sufficient to participate in ADLs using manual wheelchair Arm : _58 left _59 right   _60 Both      Distance: ?????  Foot:  _0 left _1 right   _2 Both  Operate Scooter: _3  Strength, hand grip, balance and transfer appropriate for use _4 Living environment is accessible for use of scooter  Operate Power w/c:  _5  Std. Joystick   _6  Alternative Controls Indep _7  Assist _8  Dependent/unable _9  N/A _10   _11 Safe          _12  Functional      Distance: ?????  Bed confined without wheelchair _13  Yes _14  No   STRENGTH/RANGE OF MOTION:  Active Range of Motion Strength  Shoulder R shoulder flexion= 152 degrees; abduction= 148 degrees;  L shoulder flexion= 132 degrees; abduction = 124 degrees 4/5 in available range for bil. UE's  Elbow WNL's bil. UE's WFL's  Wrist/Hand WNL's bil. UE's WFL's  Hip WFL's bil. UE's grossly 3-4/5 bil. LE's  Knee able to actively extend and flex residual limbs NT due to bil. BKA  Ankle N/A = bil. BKA N/A     MOBILITY/BALANCE:  _15  Patient is totally dependent for mobility  ?????    Balance Transfers Ambulation  Sitting Balance: Standing Balance: _16  Independent _17  Independent/Modified Independent  _18  WFL     _19  WFL _20  Supervision _21  Supervision  _22  Uses UE for balance  _23  Supervision _24  Min Assist _25  Ambulates with Assist  ?????    _26  Min Assist _27  Min assist _28  Mod Assist _29  Ambulates with Device:      _30  RW  _31  StW  _32  Cane  _33  ?????  _34  Mod Assist _35  Mod assist _36  Max assist   _37  Max Assist _38  Max assist _39  Dependent _40  Indep. Short Distance Only  _41  Unable _42  Unable _43   Lift / Sling Required Distance (in feet)  ?????   _44  Sliding board _45  Unable to Ambulate (see explanation below)  Cardio Status:  _46 Intact  _47  Impaired   _48  NA     has CAD and CHF  Respiratory Status:  _49 Intact   _50 Impaired   _51 NA     has chronic systolic CHF  Orthotics/Prosthetics: received bil. prostheses in Aug. 2013; pt states she wore them until Feb. 2015 and has been unable to wear them since that time  Comments (Address manual vs power w/c vs scooter): Pt is unable to functionally and effectively propel a manual wheelchair due to compromised cardiac and respiratory systems due to diagnoses of chronic systolic CHF and CAD;  pt is unable to safely operate a scooter due to decreased trunk stability and high COG with patient not having lower extremities due to s/p bil. BKA.  Pt also has decreased AROM in bil. UE's with LUE more impaired than RUE.             Anterior / Posterior Obliquity Rotation-Pelvis ?????  PELVIS    _52  _53  _54   Neutral Posterior Anterior  _55  _56  _57   WFL Rt elev Lt elev  _58  _59  _60   WFL Right Left                      Anterior    Anterior     _61  Fixed _62  Other _63  Partly Flexible _64  Flexible   _65  Fixed _66  Other _67  Partly Flexible  _68  Flexible  _69  Fixed _70  Other _71  Partly Flexible  _72  Flexible   TRUNK  _73  _74  _75   WFL ? Thoracic ? Lumbar  Kyphosis Lordosis  _76  _77  _78   [facility] Convex Convex  Right Left _79 c-curve _80 s-curve _81 multiple  _82  Neutral _83  Left-anterior _84  Right-anterior     _85   Fixed _0  Flexible _1  Partly Flexible _2  Other  _3  Fixed _4  Flexible _5  Partly Flexible _6  Other  _7  Fixed             _8  Flexible _9  Partly Flexible _10  Other    Position Windswept  ?????  HIPS          _11            _12               _13    Neutral       Abduct        ADduct         _14           _15            _16   Neutral Right           Left      _17  Fixed _18  Subluxed _19  Partly Flexible _20  Dislocated _21  Flexible  _22  Fixed _23  Other _24  Partly Flexible  _25  Flexible                  Foot Positioning Knee Positioning  N/A - pt is bil. BKA    _26  WFL  _27 Lt _28 Rt _29  WFL  _30 Lt _31 Rt    KNEES ROM concerns: ROM concerns:    & Dorsi-Flexed _32 Lt _33 Rt ?????    FEET Plantar Flexed _34 Lt _35 Rt      Inversion                 _36 Lt _37 Rt      Eversion                 _38 Lt _39 Rt     HEAD _40  Functional _41  Good Head Control  ?????  & _42  Flexed         _43  Extended _44  Adequate Head Control    NECK _45  Rotated  Lt  _46  Lat Flexed Lt _47  Rotated  Rt _48  Lat Flexed Rt _49  Limited Head Control     _50  Cervical Hyperextension _51  Absent  Head Control     SHOULDERS ELBOWS WRIST& HAND ?????      Left     Right    Left     Right    Left     Right   U/E _52 Functional           _53 Functional ????? ????? _54 Fisting             _55 Fisting      _56 elev   _57 dep      _58 elev   _59 dep       _60 pro -_61 retract     _62 pro  _63 retract _64 subluxed             _65 subluxed           Goals for Wheelchair Mobility  _66  Independence with mobility in the home with motor related ADLs (MRADLs)  _67  Independence with MRADLs in the community _68  Provide dependent mobility  _69  Provide recline     _70 Provide tilt   Goals for Seating system _71  Optimize pressure distribution _72  Provide support needed to facilitate function or safety _73  Provide corrective forces to assist with maintaining or improving posture _74  Accommodate client's posture:   current seated postures and positions are not flexible or will not tolerate corrective forces _75  Client to be independent with relieving pressure in the wheelchair _76 Enhance physiological function such as breathing, swallowing, digestion  Simulation ideas/Equipment trials:????? State why other equipment was unsuccessful:?????  MOBILITY BASE RECOMMENDATIONS and JUSTIFICATION: MOBILITY COMPONENT JUSTIFICATION  Manufacturer: [employer] Model: J6   Size: Width 20Seat Depth 20 _0 provide transport from point A to B      _1 promote Indep mobility  _2 is not a safe, functional  ambulator _3 walker or cane inadequate _4 non-standard width/depth necessary to accommodate anatomical measurement _5  ?????  _6 Manual Mobility Base _7 non-functional ambulator    _8 Scooter/POV  _9 can safely operate  _10 can safely transfer   _11 has adequate trunk stability  _12 cannot functionally propel manual w/c  _13 Power Mobility Base  _14 non-ambulatory  _15 cannot functionally propel manual wheelchair  _16  cannot functionally and safely operate scooter/POV _17 can safely operate and willing to  _18 Stroller Base _19 infant/child  _20 unable to propel manual wheelchair _21 allows for growth _22 non-functional ambulator _23 non-functional UE _24 Indep mobility is not a goal at this time  _25 Tilt  _26 Forward _27 Backward _28 Powered tilt  _29 Manual tilt  _30 change position against gravitational force on head and shoulders  _31 change position for pressure relief/cannot weight shift _32 transfers  _33 management of tone _34 rest periods _35 control edema _36 facilitate postural control  _37  ?????  _38 Recline  _39 Power recline on power base _40 Manual recline on manual base  _41 accommodate femur to back angle  _42 bring to full recline for ADL care  _43 change position for pressure relief/cannot weight shift _44 rest periods _45 repositioning for transfers or clothing/diaper /catheter changes _46 head positioning  _47 Lighter weight required _48 self- propulsion  _49 lifting _50  ?????  _51 Heavy Duty required _52 user weight greater than 250# _53 extreme tone/ over active movement _54 broken frame on previous chair _55  ?????  _56  Back  _57  Angle Adjustable _58  Custom molded Synergy _59 postural control _60 control of tone/spasticity _61 accommodation of range of motion _62 UE functional control _63 accommodation for seating system _64  ????? _65 provide lateral trunk support _66 accommodate deformity _67 provide posterior trunk support _68 provide lumbar/sacral support _69 support trunk in midline _70 Pressure relief over spinal processes  _71  Seat  Cushion TruComfort 2 _72 impaired sensation  _73 decubitus ulcers present _74 history of pressure ulceration _75 prevent pelvic extension _76 low maintenance  _77 stabilize pelvis  _78 accommodate obliquity _79 accommodate multiple deformity _80 neutralize lower extremity position _81 increase pressure distribution _82  ?????  _83  Pelvic/thigh support  _84  Lateral thigh guide _85  Distal medial pad  _86  Distal lateral pad _87  pelvis in neutral _88 accommodate pelvis _89  position upper legs _90  alignment _91  accommodate ROM _92  decr adduction _93 accommodate tone _94 removable for transfers _95 decr abduction  _96  Lateral trunk Supports _97  Lt     _98  Rt _99 decrease lateral trunk leaning _100 control tone _101 contour for increased contact _102 safety  _103 accommodate asymmetry _104  ?????  _105  Mounting hardware  _106 lateral trunk supports  _107 back   _108 seat _109 headrest      _110  thigh support _111 fixed   _112 swing away _113 attach seat platform/cushion to w/c frame _114 attach back cushion to w/c frame _115 mount postural supports _116 mount headrest  _117 swing medial thigh support away _118 swing lateral supports away for transfers  _119  ?????    Armrests  _120 fixed _121 adjustable height _122 removable   _123 swing away  _124 flip back   _125 reclining _126 full length pads _127 desk    _128 pads tubular  _129 provide support with elbow at 90   _130 provide support for w/c tray _131 change of height/angles for variable activities _132 remove for transfers _133 allow to come closer to table top _134 remove for access to tables _135  ?????  Hangers/ Leg rests  _136 60 _137 70 _138 90 _139 elevating _140 heavy duty  _141 articulating _142 fixed _143 lift off _144 swing away     _145 power _146 provide LE support  _147 accommodate to hamstring tightness _148 elevate legs during recline   _149 provide change in position for Legs _150 Maintain placement of feet on footplate _151 durability _152 enable transfers _153 decrease edema _154   Accommodate lower leg length _0  ?????  Foot support Footplate    <WUJWJXBJYNWGNFAO>_1<\/HYQMVHQIONGEXBMW>_4 Lt  _2  Rt  _3  Center mount _4 flip up      _5 depth/angle adjustable _6 Amputee adapter    _7  Lt     _8  Rt _9 provide foot support _10 accommodate to ankle ROM _11 transfers _12 Provide support for residual extremity _13  allow foot to go under wheelchair base _14  decrease tone  _15  ?????  _16  Ankle strap/heel loops _17 support foot on foot support _18 decrease extraneous movement _19 provide input to heel  _20 protect foot  Tires: _21 pneumatic  _22 flat free inserts  _23 solid  _24 decrease maintenance  _25 prevent frequent flats _26 increase shock absorbency _27 decrease pain from road shock _28 decrease spasms from road shock _29  ?????  _30  Headrest  _31 provide posterior head support _32 provide posterior neck support _33 provide lateral head support _34 provide anterior head support _35 support during tilt and recline _36 improve feeding   _37 improve respiration _38 placement of switches _39 safety  _40 accommodate ROM  _41 accommodate tone _42 improve visual orientation  _43  Anterior chest strap _44  Vest _45  Shoulder retractors  _46 decrease forward movement of shoulder _47 accommodation of TLSO _48 decrease forward movement of trunk _49 decrease shoulder elevation _50 added abdominal support _51 alignment _52 assistance with shoulder control  _53  ?????  Pelvic Positioner _54 Belt _55 SubASIS bar _56 Dual Pull _57 stabilize tone _58 decrease falling out of chair/ **will not Decr potential for sliding due to pelvic tilting _59 prevent excessive rotation _60 pad for protection over boney prominence _61 prominence comfort _62 special pull angle to control rotation _63  ?????  Upper Extremity Support _64 L   _65  R _66 Arm trough    _67 hand support _68  tray       _69 full tray _70 swivel mount _71 decrease edema      _72 decrease subluxation   _73 control tone   _74 placement for AAC/Computer/EADL _75 decrease gravitational pull on shoulders _76 provide midline positioning _77 provide support to increase UE function _78 provide hand support in natural position _79 provide work surface   POWER WHEELCHAIR  CONTROLS  _80 Proportional  _81 Non-Proportional Type joystick _82 Left  _83 Right _84 provides access for controlling wheelchair   _85 lacks motor control to operate proportional drive control <XLKGMWNUUVOZDGUY>_4<\/IHKVQQVZDGLOVFIE>_33 unable to understand proportional controls  Actuator Control Module  _87 Single  _88 Multiple   _89 Allow the client to operate the power seat function(s) through the joystick control   _90 Safety Reset Switches _91 Used to change modes and stop the wheelchair when driving in latch mode    _92 [employer]   _93 programming for accurate control _94 progressive Disease/changing condition _95 non-proportional drive control needed _96 Needed in order to operate power seat functions through joystick control   _97 Display box _98 Allows user to see in which mode and drive the wheelchair is set  _99 necessary for alternate controls    _100 Digital interface electronics _101 Allows w/c to operate when using alternative drive controls  <IRJJOACZYSAYTKZS>_0<\/FUXNATFTDDUKGURK>_270 ASL Head Array _103 Allows client to operate wheelchair  through switches placed in tri-panel headrest  _104 Sip and puff with tubing kit _105 needed to operate sip and puff drive controls  <WCBJSEGBTDVVOHYW>_7<\/PXTGGYIRSWNIOEVO>_350 Upgraded tracking electronics _107 increase safety when driving <KXFGHWEXHBZJIRCV>_8<\/LFYBOFBPZWCHENID>_782 correct tracking when on uneven surfaces  _109 [facility] for switches or joystick _110 Attaches switches to w/c  _111 Swing away for access or transfers _112 midline for optimal placement _113 provides for consistent access  _114 Attendant controlled joystick plus mount _115 safety _116 long distance driving <UMPNTIRWERXVQMGQ>_6<\/PYPPJKDTOIZTIWPY>_099 operation of seat functions _118 compliance with transportation regulations _119  ?????    Rear wheel placement/Axle adjustability _120 None _121 semi adjustable _122 fully adjustable  _123 improved UE access to wheels _124 improved stability _125 changing angle in space for improvement of postural stability _126 1-arm drive access <IPJASNKNLZJQBHAL>_9<\/FXTKWIOXBDZHGDJM>_426 amputee pad placement _128  ?????  Wheel rims/ hand rims  _129 metal  _130 plastic coated _131 oblique projections _132 vertical projections _133 Provide  ability to propel manual  wheelchair  _0  Increase self-propulsion with hand weakness/decreased grasp  Push handles _1 extended  _2 angle adjustable  _3 standard _4 caregiver access _5 caregiver assist _6 allows "hooking" to enable increased ability to perform ADLs or maintain balance  One armed device  _7 Lt   _8 Rt _9 enable propulsion of manual wheelchair with one arm   _10  ?????   Brake/wheel lock extension _11  Lt   _12  Rt _13 increase indep in applying wheel locks   _14 Side guards _15 prevent clothing getting caught in wheel or becoming soiled _16  prevent skin tears/abrasions  Battery: U1 x 2 _17 to power wheelchair ?????  Other: ????? ????? ?????  The above equipment has a life- long use expectancy. Growth and changes in medical and/or functional conditions would be the exceptions. This is to certify that the therapist has no financial relationship with durable medical provider or manufacturer. The therapist will not receive remuneration of any kind for the equipment recommended in this evaluation.   Patient has mobility limitation that significantly impairs safe, timely participation in one or more mobility related ADL's.  (bathing, toileting, feeding, dressing, grooming, moving from room to room)                                                             _18  Yes _19  No Will mobility device sufficiently improve ability to participate and/or be aided in participation of MRADL's?         _20  Yes _21  No Can limitation be compensated for with use of a cane or walker?                                                                                _22  Yes _23  No Does patient or caregiver demonstrate ability/potential ability & willingness to safely use the mobility device?   _24  Yes _25  No Does patient's home environment support use of recommended mobility device?                                                    _26  Yes _27  No Does patient have sufficient upper extremity function necessary to functionally propel a manual wheelchair?    _28  Yes  _29  No Does patient have sufficient strength and trunk stability to safely operate a POV (scooter)?                                  _30  Yes _31  No Does patient need additional features/benefits provided by a power wheelchair for MRADL's in the home?       _32  Yes _33  No Does the patient demonstrate the ability to safely use a power wheelchair?                                                              [  x] Yes _0  No  Therapist Name Printed: Guido Sander, PT Date: June 26, 2015  Therapist's Signature:   Date:   Supplier's Name Printed: [employer] Date: 2015-06-26  Supplier's Signature:   Date:  Patient/Caregiver Signature:   Date:     This is to certify that I have read this evaluation and do agree with the content within:    Physician's Name Printed: Colin Benton, DO  Physician's Signature:  Date:                                   G-Codes - 26-Jun-2015 1439    Functional Assessment Tool Used pt is s/p bil. BKA - unable to stand/ambulate - requires use of power wheelchair for mobility   Functional Limitation Mobility: Walking and moving around   Mobility: Walking and Moving Around Current Status [phone]) At least 80 percent but less than 100 percent impaired, limited or restricted   Mobility: Walking and Moving Around Goal Status [phone]) At least 80 percent but less than 100 percent impaired, limited or restricted   Mobility: Walking and Moving Around Discharge Status [phone]) At least 80 percent but less than 100 percent impaired, limited or restricted       Problem List Patient Active Problem List   Diagnosis Date Noted  . Acute on chronic systolic heart failure ([place]) 02/21/2015  . Type II diabetes mellitus with ophthalmic manifestations, uncontrolled ([place]) 12/22/2014  . Hyperlipidemia 12/22/2014  . CAD (coronary artery disease) 12/12/2014  . Diabetic retinopathy ([place]) 12/12/2014  . PVD (peripheral vascular disease) ([place]) 12/12/2014  . CKD (chronic kidney  disease) stage 3, GFR 30-59 ml/min 12/12/2014  . Type 2 diabetes mellitus with diabetic nephropathy ([place]) 11/28/2014  . Chronic systolic congestive heart failure ([place]) 11/28/2014  . Cirrhosis ([place]) 11/28/2014  . Generalized OA 11/28/2014  . S/P bilateral BKA (below knee amputation) ([place]) 11/28/2014    Gatlin Kittell, [place], PT June 26, 2015, 3:08 PM  [facility] [address] [place], [place], [address] Phone: [phone]   Fax:  [phone]  Name: Sue Martin MRN: [id number] Date of Birth: Jun 23, 1966

## 2015-06-12 DIAGNOSIS — N183 Chronic kidney disease, stage 3 (moderate): Secondary | ICD-10-CM | POA: Diagnosis not present

## 2015-06-12 DIAGNOSIS — I509 Heart failure, unspecified: Secondary | ICD-10-CM | POA: Diagnosis not present

## 2015-06-12 DIAGNOSIS — I5021 Acute systolic (congestive) heart failure: Secondary | ICD-10-CM | POA: Diagnosis not present

## 2015-06-24 DIAGNOSIS — R918 Other nonspecific abnormal finding of lung field: Secondary | ICD-10-CM | POA: Diagnosis not present

## 2015-06-24 DIAGNOSIS — J811 Chronic pulmonary edema: Secondary | ICD-10-CM | POA: Diagnosis not present

## 2015-06-25 DIAGNOSIS — N179 Acute kidney failure, unspecified: Secondary | ICD-10-CM | POA: Diagnosis not present

## 2015-06-25 DIAGNOSIS — E7800 Pure hypercholesterolemia, unspecified: Secondary | ICD-10-CM | POA: Diagnosis not present

## 2015-06-25 DIAGNOSIS — N189 Chronic kidney disease, unspecified: Secondary | ICD-10-CM | POA: Diagnosis not present

## 2015-06-25 DIAGNOSIS — I429 Cardiomyopathy, unspecified: Secondary | ICD-10-CM | POA: Diagnosis not present

## 2015-06-25 DIAGNOSIS — I10 Essential (primary) hypertension: Secondary | ICD-10-CM | POA: Diagnosis not present

## 2015-06-25 DIAGNOSIS — I5023 Acute on chronic systolic (congestive) heart failure: Secondary | ICD-10-CM | POA: Diagnosis not present

## 2015-06-25 DIAGNOSIS — I2510 Atherosclerotic heart disease of native coronary artery without angina pectoris: Secondary | ICD-10-CM | POA: Diagnosis not present

## 2015-06-26 DIAGNOSIS — I2510 Atherosclerotic heart disease of native coronary artery without angina pectoris: Secondary | ICD-10-CM | POA: Diagnosis not present

## 2015-06-26 DIAGNOSIS — N189 Chronic kidney disease, unspecified: Secondary | ICD-10-CM | POA: Diagnosis not present

## 2015-06-26 DIAGNOSIS — N179 Acute kidney failure, unspecified: Secondary | ICD-10-CM | POA: Diagnosis not present

## 2015-06-26 DIAGNOSIS — E7800 Pure hypercholesterolemia, unspecified: Secondary | ICD-10-CM | POA: Diagnosis not present

## 2015-06-26 DIAGNOSIS — I10 Essential (primary) hypertension: Secondary | ICD-10-CM | POA: Diagnosis not present

## 2015-06-26 DIAGNOSIS — I5023 Acute on chronic systolic (congestive) heart failure: Secondary | ICD-10-CM | POA: Diagnosis not present

## 2015-06-26 DIAGNOSIS — I429 Cardiomyopathy, unspecified: Secondary | ICD-10-CM | POA: Diagnosis not present

## 2015-06-27 DIAGNOSIS — E7800 Pure hypercholesterolemia, unspecified: Secondary | ICD-10-CM | POA: Diagnosis not present

## 2015-06-27 DIAGNOSIS — I2510 Atherosclerotic heart disease of native coronary artery without angina pectoris: Secondary | ICD-10-CM | POA: Diagnosis not present

## 2015-06-27 DIAGNOSIS — N179 Acute kidney failure, unspecified: Secondary | ICD-10-CM | POA: Diagnosis not present

## 2015-06-27 DIAGNOSIS — I429 Cardiomyopathy, unspecified: Secondary | ICD-10-CM | POA: Diagnosis not present

## 2015-06-27 DIAGNOSIS — I5023 Acute on chronic systolic (congestive) heart failure: Secondary | ICD-10-CM | POA: Diagnosis not present

## 2015-06-27 DIAGNOSIS — N189 Chronic kidney disease, unspecified: Secondary | ICD-10-CM | POA: Diagnosis not present

## 2015-06-27 DIAGNOSIS — I10 Essential (primary) hypertension: Secondary | ICD-10-CM | POA: Diagnosis not present

## 2015-06-28 DIAGNOSIS — E785 Hyperlipidemia, unspecified: Secondary | ICD-10-CM | POA: Diagnosis not present

## 2015-06-28 DIAGNOSIS — I5023 Acute on chronic systolic (congestive) heart failure: Secondary | ICD-10-CM | POA: Diagnosis not present

## 2015-06-28 DIAGNOSIS — I739 Peripheral vascular disease, unspecified: Secondary | ICD-10-CM | POA: Diagnosis not present

## 2015-06-28 DIAGNOSIS — N179 Acute kidney failure, unspecified: Secondary | ICD-10-CM | POA: Diagnosis not present

## 2015-06-28 DIAGNOSIS — E7800 Pure hypercholesterolemia, unspecified: Secondary | ICD-10-CM | POA: Diagnosis not present

## 2015-06-28 DIAGNOSIS — N183 Chronic kidney disease, stage 3 (moderate): Secondary | ICD-10-CM | POA: Diagnosis not present

## 2015-06-28 DIAGNOSIS — I2510 Atherosclerotic heart disease of native coronary artery without angina pectoris: Secondary | ICD-10-CM | POA: Diagnosis not present

## 2015-06-28 DIAGNOSIS — K7460 Unspecified cirrhosis of liver: Secondary | ICD-10-CM | POA: Diagnosis not present

## 2015-06-28 DIAGNOSIS — I10 Essential (primary) hypertension: Secondary | ICD-10-CM | POA: Diagnosis not present

## 2015-06-28 DIAGNOSIS — N189 Chronic kidney disease, unspecified: Secondary | ICD-10-CM | POA: Diagnosis not present

## 2015-06-28 DIAGNOSIS — I429 Cardiomyopathy, unspecified: Secondary | ICD-10-CM | POA: Diagnosis not present

## 2015-06-30 ENCOUNTER — Telehealth: Payer: Self-pay | Admitting: *Deleted

## 2015-06-30 NOTE — Telephone Encounter (Signed)
Transition Care Management Follow-up Telephone Call  How have you been since you were released from the hospital? Some SOB but okay   Do you understand why you were in the hospital? yes   Do you understand the discharge instrcutions? yes  Items Reviewed:  Medications reviewed: yes  Allergies reviewed: yes  Dietary changes reviewed: yes  Referrals reviewed: yes   Functional Questionnaire:   Activities of Daily Living (ADLs):   She states they are independent in the following: ambulation, bathing and hygiene, feeding, continence, grooming, toileting and dressing States they require assistance with the following: dressing and use of a power wheelchair   Any transportation issues/concerns?: no   Any patient concerns? Yes - needs for [facility] to come in for a prescription for her power wheelchair   Confirmed importance and date/time of follow-up visits scheduled: yes    Confirmed with patient if condition begins to worsen call PCP or go to the ER.  Patient was given the Call-a-Nurse line [phone]: yes Patient was discharged 06/28/15 Patient was discharged to her home Patient has an appointment with Dr Selena BattenKim 07/06/15.  Patient could not come in earlier.

## 2015-07-01 DIAGNOSIS — I130 Hypertensive heart and chronic kidney disease with heart failure and stage 1 through stage 4 chronic kidney disease, or unspecified chronic kidney disease: Secondary | ICD-10-CM | POA: Diagnosis not present

## 2015-07-01 DIAGNOSIS — Z794 Long term (current) use of insulin: Secondary | ICD-10-CM | POA: Diagnosis not present

## 2015-07-01 DIAGNOSIS — I5022 Chronic systolic (congestive) heart failure: Secondary | ICD-10-CM | POA: Diagnosis not present

## 2015-07-01 DIAGNOSIS — Z89511 Acquired absence of right leg below knee: Secondary | ICD-10-CM | POA: Diagnosis not present

## 2015-07-01 DIAGNOSIS — Z7902 Long term (current) use of antithrombotics/antiplatelets: Secondary | ICD-10-CM | POA: Diagnosis not present

## 2015-07-01 DIAGNOSIS — I2510 Atherosclerotic heart disease of native coronary artery without angina pectoris: Secondary | ICD-10-CM | POA: Diagnosis not present

## 2015-07-01 DIAGNOSIS — Z9181 History of falling: Secondary | ICD-10-CM | POA: Diagnosis not present

## 2015-07-01 DIAGNOSIS — E11319 Type 2 diabetes mellitus with unspecified diabetic retinopathy without macular edema: Secondary | ICD-10-CM | POA: Diagnosis not present

## 2015-07-01 DIAGNOSIS — E785 Hyperlipidemia, unspecified: Secondary | ICD-10-CM | POA: Diagnosis not present

## 2015-07-01 DIAGNOSIS — E1122 Type 2 diabetes mellitus with diabetic chronic kidney disease: Secondary | ICD-10-CM | POA: Diagnosis not present

## 2015-07-01 DIAGNOSIS — E1140 Type 2 diabetes mellitus with diabetic neuropathy, unspecified: Secondary | ICD-10-CM | POA: Diagnosis not present

## 2015-07-01 DIAGNOSIS — M159 Polyosteoarthritis, unspecified: Secondary | ICD-10-CM | POA: Diagnosis not present

## 2015-07-01 DIAGNOSIS — N183 Chronic kidney disease, stage 3 (moderate): Secondary | ICD-10-CM | POA: Diagnosis not present

## 2015-07-01 DIAGNOSIS — I429 Cardiomyopathy, unspecified: Secondary | ICD-10-CM | POA: Diagnosis not present

## 2015-07-01 DIAGNOSIS — Z89512 Acquired absence of left leg below knee: Secondary | ICD-10-CM | POA: Diagnosis not present

## 2015-07-01 DIAGNOSIS — K7460 Unspecified cirrhosis of liver: Secondary | ICD-10-CM | POA: Diagnosis not present

## 2015-07-01 DIAGNOSIS — E1151 Type 2 diabetes mellitus with diabetic peripheral angiopathy without gangrene: Secondary | ICD-10-CM | POA: Diagnosis not present

## 2015-07-06 ENCOUNTER — Ambulatory Visit: Payer: PRIVATE HEALTH INSURANCE | Admitting: Family Medicine

## 2015-07-07 DIAGNOSIS — N183 Chronic kidney disease, stage 3 (moderate): Secondary | ICD-10-CM | POA: Diagnosis not present

## 2015-07-07 DIAGNOSIS — I2510 Atherosclerotic heart disease of native coronary artery without angina pectoris: Secondary | ICD-10-CM | POA: Diagnosis not present

## 2015-07-07 DIAGNOSIS — E1122 Type 2 diabetes mellitus with diabetic chronic kidney disease: Secondary | ICD-10-CM | POA: Diagnosis not present

## 2015-07-07 DIAGNOSIS — I130 Hypertensive heart and chronic kidney disease with heart failure and stage 1 through stage 4 chronic kidney disease, or unspecified chronic kidney disease: Secondary | ICD-10-CM | POA: Diagnosis not present

## 2015-07-07 DIAGNOSIS — M159 Polyosteoarthritis, unspecified: Secondary | ICD-10-CM | POA: Diagnosis not present

## 2015-07-07 DIAGNOSIS — I5022 Chronic systolic (congestive) heart failure: Secondary | ICD-10-CM | POA: Diagnosis not present

## 2015-07-09 DIAGNOSIS — N183 Chronic kidney disease, stage 3 (moderate): Secondary | ICD-10-CM | POA: Diagnosis not present

## 2015-07-09 DIAGNOSIS — E1122 Type 2 diabetes mellitus with diabetic chronic kidney disease: Secondary | ICD-10-CM | POA: Diagnosis not present

## 2015-07-09 DIAGNOSIS — I5022 Chronic systolic (congestive) heart failure: Secondary | ICD-10-CM | POA: Diagnosis not present

## 2015-07-09 DIAGNOSIS — M159 Polyosteoarthritis, unspecified: Secondary | ICD-10-CM | POA: Diagnosis not present

## 2015-07-09 DIAGNOSIS — I2510 Atherosclerotic heart disease of native coronary artery without angina pectoris: Secondary | ICD-10-CM | POA: Diagnosis not present

## 2015-07-09 DIAGNOSIS — I130 Hypertensive heart and chronic kidney disease with heart failure and stage 1 through stage 4 chronic kidney disease, or unspecified chronic kidney disease: Secondary | ICD-10-CM | POA: Diagnosis not present

## 2015-07-10 DIAGNOSIS — N183 Chronic kidney disease, stage 3 (moderate): Secondary | ICD-10-CM | POA: Diagnosis not present

## 2015-07-10 DIAGNOSIS — I509 Heart failure, unspecified: Secondary | ICD-10-CM | POA: Diagnosis not present

## 2015-07-14 ENCOUNTER — Ambulatory Visit (INDEPENDENT_AMBULATORY_CARE_PROVIDER_SITE_OTHER): Payer: PRIVATE HEALTH INSURANCE | Admitting: Family Medicine

## 2015-07-14 ENCOUNTER — Telehealth: Payer: Self-pay | Admitting: Family Medicine

## 2015-07-14 ENCOUNTER — Encounter: Payer: Self-pay | Admitting: Family Medicine

## 2015-07-14 VITALS — BP 100/72 | HR 68 | Temp 98.3°F | Ht <= 58 in

## 2015-07-14 DIAGNOSIS — I5023 Acute on chronic systolic (congestive) heart failure: Secondary | ICD-10-CM

## 2015-07-14 DIAGNOSIS — N183 Chronic kidney disease, stage 3 unspecified: Secondary | ICD-10-CM

## 2015-07-14 DIAGNOSIS — I2510 Atherosclerotic heart disease of native coronary artery without angina pectoris: Secondary | ICD-10-CM | POA: Diagnosis not present

## 2015-07-14 DIAGNOSIS — E1165 Type 2 diabetes mellitus with hyperglycemia: Secondary | ICD-10-CM

## 2015-07-14 DIAGNOSIS — I5022 Chronic systolic (congestive) heart failure: Secondary | ICD-10-CM | POA: Diagnosis not present

## 2015-07-14 DIAGNOSIS — I739 Peripheral vascular disease, unspecified: Secondary | ICD-10-CM

## 2015-07-14 DIAGNOSIS — E11319 Type 2 diabetes mellitus with unspecified diabetic retinopathy without macular edema: Secondary | ICD-10-CM | POA: Diagnosis not present

## 2015-07-14 DIAGNOSIS — E785 Hyperlipidemia, unspecified: Secondary | ICD-10-CM

## 2015-07-14 LAB — POCT GLYCOSYLATED HEMOGLOBIN (HGB A1C): HEMOGLOBIN A1C: 9.3

## 2015-07-14 MED ORDER — SPIRONOLACTONE 25 MG PO TABS: 25.0000 mg | ORAL_TABLET | Freq: Every day | ORAL | Status: DC

## 2015-07-14 MED ORDER — CARVEDILOL 6.25 MG PO TABS: 6.2500 mg | ORAL_TABLET | Freq: Every day | ORAL | Status: DC

## 2015-07-14 MED ORDER — HYDRALAZINE HCL 10 MG PO TABS: 10.0000 mg | ORAL_TABLET | Freq: Three times a day (TID) | ORAL | Status: DC

## 2015-07-14 MED ORDER — ATORVASTATIN CALCIUM 10 MG PO TABS: 10.0000 mg | ORAL_TABLET | Freq: Every day | ORAL | Status: DC

## 2015-07-14 NOTE — Patient Instructions (Addendum)
BEFORE YOU LEAVE: -POC hgba1c -follow up in 4 months  PLEASE contact your cardiologist regarding refills on your fluid pills, Imdur (isosorbide), plavix and lisinopril  Please call you diabetes doctor today to schedule a follow up  Please contact your cardiologist immediately if you have any concerns regarding your heart, fluid status or your fluid or heart medications  Please call to schedule a pap smear with gynecology  We recommend the following healthy lifestyle measures: - eat a healthy whole foods diet consisting of regular small meals composed of vegetables, fruits, beans, nuts, seeds, healthy meats such as white chicken and fish and whole grains.  - avoid sweets, white starchy foods, fried foods, fast food, processed foods, sodas, red meet and other fattening foods.  - get a least 150-300 minutes of aerobic activity per week.

## 2015-07-14 NOTE — Progress Notes (Signed)
HPI:  DM: -complications: s/p bilat LE amputation and on disability for this - reports spent 45 days at [facility] in 2012 for gangrene, PVD, Renal Failure, diabetic retinopathy -seeing endocrinology and nephrology -meds: lantus 50am 40pm, humalog 25,25,35 per her report -sees optho/legally blind: sees Dr Johna Sheriff -denies hypglycemia  CKD/hypokalemia: -sees nephrology - reports saw him a few days ago and had kidney labs repeated -reports allergic to all forms of K+ and refuses to take -rarely uses ibuprofen  Hx of CAD, CHF: -hospitalized 12/28-06/28/15 for CHF - reports feels much better -S/p stenting Jan of 2007 [facility] in [place], [place] -sees cardiologist in [place] now, Dr. Wynonia Hazard - reports he saw her daily in the hospital and she has follow up in March -echo 9/16 with EF 35% -meds: asa, plavix, lipitor, lisinopril(stop in hospital - reports restarted by nephrology), torsemide, metalazone, spironolactone, 6.25 bid, hydralazine 10 (added in hospital), imdur 30 (added in hospital) -denies CP, SOB, swelling, reports weight down  OA/muscle spsm: -knees and back - flexeril added in hospital -takes ibuprofen rarely for this  ROS: See pertinent positives and negatives per HPI.  Past Medical History  Diagnosis Date  . Diabetes mellitus with renal complications (HCC)     PVD and retinopathy, S/p bilat ambutations  . CHF (congestive heart failure) (HCC)     sees Dr. Wynonia Hazard  . Chronic kidney disease   . Liver cirrhosis (HCC)   . Retinal detachment     sees Dr. Johna Sheriff per her report  . Lyme disease   . Glaucoma   . CAD (coronary artery disease) 12/12/2014    -s/p 2V PCI of the LAD/RCA with taxus stents -cardiologist is Dr. Carmon Sails, [facility]   . Depression   . 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 Leg amputation below knee  09/09/2011, 09/11/2011  . Tubal ligation     . Lasik      Family History  Problem Relation Age of Onset  . Arthritis Mother   . Heart disease Mother   . Mental illness Mother   . Diabetes Mother   . Arthritis Maternal Grandmother   . Diabetes Maternal Grandmother   . Arthritis Father   . CVA Father   . Hypertension Father   . Sudden death Paternal Grandfather     Social History   Social History  . Marital Status: Married    Spouse Name: N/A  . Number of Children: N/A  . Years of Education: N/A   Social History Main Topics  . Smoking status: Never Smoker   . Smokeless tobacco: None  . Alcohol Use: No  . Drug Use: No  . Sexual Activity: Not Asked   Other Topics Concern  . None   Social History Narrative   Married   Husband is a Naval architect        Current outpatient prescriptions:  .  aspirin 325 MG tablet, Take 325 mg by mouth at bedtime. , Disp: , Rfl:  .  atorvastatin (LIPITOR) 10 MG tablet, Take 1 tablet (10 mg total) by mouth daily at 6 PM., Disp: 90 tablet, Rfl: 3 .  carvedilol (COREG) 6.25 MG tablet, Take 1 tablet (6.25 mg total) by mouth daily., Disp: 120 tablet, Rfl: 3 .  cefpodoxime (VANTIN) 200 MG tablet, Take 1 tablet (200 mg total) by mouth 2 (two) times daily., Disp: 12 tablet, Rfl: 0 .  clopidogrel (PLAVIX) 75  MG tablet, Take 75 mg by mouth., Disp: , Rfl:  .  cyclobenzaprine (FLEXERIL) 10 MG tablet, Take 10 mg by mouth 3 (three) times daily as needed for muscle spasms. , Disp: , Rfl:  .  glucose blood test strip, Check sugars four times daily   Dx E11.9, Disp: , Rfl:  .  hydrALAZINE (APRESOLINE) 10 MG tablet, Take 1 tablet (10 mg total) by mouth 3 (three) times daily., Disp: 270 tablet, Rfl: 3 .  ibuprofen (ADVIL,MOTRIN) 200 MG tablet, Take 200 mg by mouth every 6 (six) hours as needed., Disp: , Rfl:  .  Insulin Glargine (LANTUS SOLOSTAR) 100 UNIT/ML Solostar Pen, Inject 40-55 Units into the skin 2 (two) times daily. 55 units every morning and 40 units every evening., Disp: , Rfl:  .  insulin  lispro (HUMALOG) 100 UNIT/ML KiwkPen, Inject 25 units three times a day with meals (Patient taking differently: Inject 25 units with breakfast and lunch-35 units with dinner), Disp: 30 mL, Rfl: 3 .  isosorbide mononitrate (IMDUR) 30 MG 24 hr tablet, Take 30 mg by mouth 3 (three) times daily. , Disp: , Rfl:  .  metolazone (ZAROXOLYN) 5 MG tablet, Take 1 tablet (5 mg total) by mouth daily. 30 minutes before Demadex dose, Disp: 90 tablet, Rfl: 3 .  ondansetron (ZOFRAN) 4 MG tablet, Take 1 tablet (4 mg total) by mouth every 8 (eight) hours as needed for nausea or vomiting., Disp: 20 tablet, Rfl: 0 .  spironolactone (ALDACTONE) 25 MG tablet, Take 1 tablet (25 mg total) by mouth daily., Disp: 90 tablet, Rfl: 3 .  torsemide (DEMADEX) 20 MG tablet, Take 1 tablet (20 mg total) by mouth 2 (two) times daily., Disp: 180 tablet, Rfl: 3 .  lisinopril (PRINIVIL,ZESTRIL) 10 MG tablet, Take 10 mg by mouth at bedtime. , Disp: , Rfl:   EXAM:  Filed Vitals:   07/14/15 0950  BP: 100/72  Pulse: 68  Temp: 98.3 F (36.8 C)    There is no weight on file to calculate BMI.  GENERAL: vitals reviewed and listed above, alert, oriented, appears well hydrated and in no acute distress  HEENT: atraumatic, conjunttiva clear, no obvious abnormalities on inspection of external nose and ears  NECK: no obvious masses on inspection  LUNGS: clear to auscultation bilaterally, no wheezes, rales or rhonchi, good air movement  CV: HRRR, no peripheral edema  MS: moves all extremities without noticeable abnormality  PSYCH: pleasant and cooperative, no obvious depression or anxiety  ASSESSMENT AND PLAN:  Discussed the following assessment and plan:  Uncontrolled type 2 diabetes mellitus with retinopathy, without long-term current use of insulin, macular edema presence unspecified, unspecified retinopathy severity (HCC)  CKD (chronic kidney disease) stage 3, GFR 30-59 ml/min  Chronic systolic congestive heart failure  (HCC)  Coronary artery disease involving native coronary artery of native heart without angina pectoris  PVD (peripheral vascular disease) (HCC)  Acute on chronic systolic heart failure (HCC)  Hyperlipidemia  -pap smear with gyn advised -advised assistant to obtain labs from nephrologist -advised cardiology follow up - on odd dosing of of some meds and advised cardiology refill her heart meds to ensure they are aware of any dosing and changes -did refill some of her BP meds per her request -check hgba1c and advised diabetes follow up with her endocrinologist -Patient advised to return or notify a doctor immediately if symptoms worsen or persist or new concerns arise.  Patient Instructions  BEFORE YOU LEAVE: -POC hgba1c -follow up in 4 months  PLEASE contact your cardiologist regarding refills on your fluid pills, Imdur (isosorbide), plavix and lisinopril  Please call you diabetes doctor today to schedule a follow up  Please contact your cardiologist immediately if you have any concerns regarding your heart, fluid status or your fluid or heart medications  Please call to schedule a pap smear with gynecology  We recommend the following healthy lifestyle measures: - eat a healthy whole foods diet consisting of regular small meals composed of vegetables, fruits, beans, nuts, seeds, healthy meats such as white chicken and fish and whole grains.  - avoid sweets, white starchy foods, fried foods, fast food, processed foods, sodas, red meet and other fattening foods.  - get a least 150-300 minutes of aerobic activity per week.         Kriste Basque R.

## 2015-07-14 NOTE — Addendum Note (Signed)
Addended by: Johnella Moloney on: 07/14/2015 10:33 AM   Modules accepted: Orders

## 2015-07-14 NOTE — Telephone Encounter (Signed)
Sue Martin from [facility] call to an appt for face to face wheel chair mobility. The face to face will consist of the review physical therapy evaulation and couple of other forms that you agree with the pt evaluation. This has to been to done with the pt being present   [place] . [phone] option 1

## 2015-07-14 NOTE — Progress Notes (Signed)
Pre visit review using our clinic review tool, if applicable. No additional management support is needed unless otherwise documented below in the visit note. 

## 2015-07-15 DIAGNOSIS — I130 Hypertensive heart and chronic kidney disease with heart failure and stage 1 through stage 4 chronic kidney disease, or unspecified chronic kidney disease: Secondary | ICD-10-CM | POA: Diagnosis not present

## 2015-07-15 DIAGNOSIS — M159 Polyosteoarthritis, unspecified: Secondary | ICD-10-CM | POA: Diagnosis not present

## 2015-07-15 DIAGNOSIS — I2510 Atherosclerotic heart disease of native coronary artery without angina pectoris: Secondary | ICD-10-CM | POA: Diagnosis not present

## 2015-07-15 DIAGNOSIS — I5022 Chronic systolic (congestive) heart failure: Secondary | ICD-10-CM | POA: Diagnosis not present

## 2015-07-15 DIAGNOSIS — E1122 Type 2 diabetes mellitus with diabetic chronic kidney disease: Secondary | ICD-10-CM | POA: Diagnosis not present

## 2015-07-15 DIAGNOSIS — N183 Chronic kidney disease, stage 3 (moderate): Secondary | ICD-10-CM | POA: Diagnosis not present

## 2015-07-15 NOTE — Telephone Encounter (Signed)
See below Cheryl. 

## 2015-07-15 NOTE — Telephone Encounter (Signed)
Ok to schedule visit and must bring letter and forms to visit. 30 minutes. Thanks.

## 2015-07-15 NOTE — Telephone Encounter (Signed)
Pt scheduled  

## 2015-07-17 DIAGNOSIS — I130 Hypertensive heart and chronic kidney disease with heart failure and stage 1 through stage 4 chronic kidney disease, or unspecified chronic kidney disease: Secondary | ICD-10-CM | POA: Diagnosis not present

## 2015-07-17 DIAGNOSIS — I5022 Chronic systolic (congestive) heart failure: Secondary | ICD-10-CM | POA: Diagnosis not present

## 2015-07-17 DIAGNOSIS — M159 Polyosteoarthritis, unspecified: Secondary | ICD-10-CM | POA: Diagnosis not present

## 2015-07-17 DIAGNOSIS — I2510 Atherosclerotic heart disease of native coronary artery without angina pectoris: Secondary | ICD-10-CM | POA: Diagnosis not present

## 2015-07-17 DIAGNOSIS — N183 Chronic kidney disease, stage 3 (moderate): Secondary | ICD-10-CM | POA: Diagnosis not present

## 2015-07-17 DIAGNOSIS — E1122 Type 2 diabetes mellitus with diabetic chronic kidney disease: Secondary | ICD-10-CM | POA: Diagnosis not present

## 2015-07-24 DIAGNOSIS — M159 Polyosteoarthritis, unspecified: Secondary | ICD-10-CM | POA: Diagnosis not present

## 2015-07-24 DIAGNOSIS — I2510 Atherosclerotic heart disease of native coronary artery without angina pectoris: Secondary | ICD-10-CM | POA: Diagnosis not present

## 2015-07-24 DIAGNOSIS — I5022 Chronic systolic (congestive) heart failure: Secondary | ICD-10-CM | POA: Diagnosis not present

## 2015-07-24 DIAGNOSIS — E1122 Type 2 diabetes mellitus with diabetic chronic kidney disease: Secondary | ICD-10-CM | POA: Diagnosis not present

## 2015-07-24 DIAGNOSIS — I130 Hypertensive heart and chronic kidney disease with heart failure and stage 1 through stage 4 chronic kidney disease, or unspecified chronic kidney disease: Secondary | ICD-10-CM | POA: Diagnosis not present

## 2015-07-24 DIAGNOSIS — N183 Chronic kidney disease, stage 3 (moderate): Secondary | ICD-10-CM | POA: Diagnosis not present

## 2015-07-27 ENCOUNTER — Ambulatory Visit (INDEPENDENT_AMBULATORY_CARE_PROVIDER_SITE_OTHER): Payer: PRIVATE HEALTH INSURANCE | Admitting: Family Medicine

## 2015-07-27 ENCOUNTER — Encounter: Payer: Self-pay | Admitting: Family Medicine

## 2015-07-27 VITALS — BP 110/70 | HR 71 | Temp 97.4°F | Ht <= 58 in

## 2015-07-27 DIAGNOSIS — I2510 Atherosclerotic heart disease of native coronary artery without angina pectoris: Secondary | ICD-10-CM

## 2015-07-27 DIAGNOSIS — I5022 Chronic systolic (congestive) heart failure: Secondary | ICD-10-CM | POA: Diagnosis not present

## 2015-07-27 DIAGNOSIS — Z89511 Acquired absence of right leg below knee: Secondary | ICD-10-CM

## 2015-07-27 DIAGNOSIS — E1121 Type 2 diabetes mellitus with diabetic nephropathy: Secondary | ICD-10-CM

## 2015-07-27 DIAGNOSIS — Z89512 Acquired absence of left leg below knee: Secondary | ICD-10-CM

## 2015-07-27 DIAGNOSIS — Z794 Long term (current) use of insulin: Secondary | ICD-10-CM

## 2015-07-27 NOTE — Progress Notes (Signed)
Pre visit review using our clinic review tool, if applicable. No additional management support is needed unless otherwise documented below in the visit note. 

## 2015-07-27 NOTE — Progress Notes (Signed)
HPI:  Sue Martin is a 50 yo F with a very complicated medical hx including DM, s/p bilat BKA, CHF with chronic swelling in stumps and fluid imbalance (hospitalized recently) - also seeing nephrologist, CKD, CAD here for assistance in completing paperwork for her scooter. She was evaluated by PT and they advised a motorized scooter with tilt function to help prevent pressure sores. Pt reports her cardiologist has also advised a tilting wheelchair to assist in elevation of her stumps. No CP, SOB, increased swelling or skin breakdown today.  ROS: See pertinent positives and negatives per HPI.  Past Medical History  Diagnosis Date  . Diabetes mellitus with renal complications (HCC)     PVD and retinopathy, S/p bilat ambutations  . CHF (congestive heart failure) (HCC)     sees Dr. Wynonia Hazard  . Chronic kidney disease   . Liver cirrhosis (HCC)   . Retinal detachment     sees Dr. Johna Sheriff per her report  . Lyme disease   . Glaucoma   . CAD (coronary artery disease) 12/12/2014    -s/p 2V PCI of the LAD/RCA with taxus stents -cardiologist is Dr. Carmon Sails, [place]   . Depression   . 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 Leg amputation below knee  09/09/2011, 09/11/2011  . Tubal ligation    . Lasik      Family History  Problem Relation Age of Onset  . Arthritis Mother   . Heart disease Mother   . Mental illness Mother   . Diabetes Mother   . Arthritis Maternal Grandmother   . Diabetes Maternal Grandmother   . Arthritis Father   . CVA Father   . Hypertension Father   . Sudden death Paternal Grandfather     Social History   Social History  . Marital Status: Married    Spouse Name: N/A  . Number of Children: N/A  . Years of Education: N/A   Social History Main Topics  . Smoking status: Never Smoker   . Smokeless tobacco: None  . Alcohol Use: No  . Drug Use: No  . Sexual Activity: Not Asked    Other Topics Concern  . None   Social History Narrative   Married   Husband is a [employer] architect        Current outpatient prescriptions:  .  aspirin 325 MG tablet, Take 325 mg by mouth at bedtime. , Disp: , Rfl:  .  atorvastatin (LIPITOR) 10 MG tablet, Take 1 tablet (10 mg total) by mouth daily at 6 PM., Disp: 90 tablet, Rfl: 3 .  carvedilol (COREG) 6.25 MG tablet, Take 1 tablet (6.25 mg total) by mouth daily., Disp: 120 tablet, Rfl: 3 .  cefpodoxime (VANTIN) 200 MG tablet, Take 1 tablet (200 mg total) by mouth 2 (two) times daily., Disp: 12 tablet, Rfl: 0 .  clopidogrel (PLAVIX) 75 MG tablet, Take 75 mg by mouth., Disp: , Rfl:  .  cyclobenzaprine (FLEXERIL) 10 MG tablet, Take 10 mg by mouth 3 (three) times daily as needed for muscle spasms. , Disp: , Rfl:  .  glucose blood test strip, Check sugars four times daily   Dx E11.9, Disp: , Rfl:  .  hydrALAZINE (APRESOLINE) 10 MG tablet, Take 1 tablet (10 mg total) by mouth 3 (three) times daily., Disp: 270 tablet, Rfl: 3 .  ibuprofen (ADVIL,MOTRIN) 200 MG tablet, Take 200 mg by mouth every 6 (six) hours  as needed., Disp: , Rfl:  .  Insulin Glargine (LANTUS SOLOSTAR) 100 UNIT/ML Solostar Pen, Inject 40-55 Units into the skin 2 (two) times daily. 55 units every morning and 40 units every evening., Disp: , Rfl:  .  insulin lispro (HUMALOG) 100 UNIT/ML KiwkPen, Inject 25 units three times a day with meals (Patient taking differently: Inject 25 units with breakfast and lunch-35 units with dinner), Disp: 30 mL, Rfl: 3 .  isosorbide mononitrate (IMDUR) 30 MG 24 hr tablet, Take 30 mg by mouth 3 (three) times daily. , Disp: , Rfl:  .  metolazone (ZAROXOLYN) 5 MG tablet, Take 1 tablet (5 mg total) by mouth daily. 30 minutes before Demadex dose, Disp: 90 tablet, Rfl: 3 .  ondansetron (ZOFRAN) 4 MG tablet, Take 1 tablet (4 mg total) by mouth every 8 (eight) hours as needed for nausea or vomiting., Disp: 20 tablet, Rfl: 0 .  spironolactone (ALDACTONE) 25 MG  tablet, Take 1 tablet (25 mg total) by mouth daily., Disp: 90 tablet, Rfl: 3 .  torsemide (DEMADEX) 20 MG tablet, Take 1 tablet (20 mg total) by mouth 2 (two) times daily., Disp: 180 tablet, Rfl: 3 .  lisinopril (PRINIVIL,ZESTRIL) 10 MG tablet, Take 10 mg by mouth at bedtime. , Disp: , Rfl:   EXAM:  Filed Vitals:   07/27/15 1427  BP: 110/70  Pulse: 71  Temp: 97.4 F (36.3 C)    There is no weight on file to calculate BMI. Pt refused weight as would require lifting her off chair onton scale, offered locations that are wheelchair compatible but declined. See PT eval.  GENERAL: vitals reviewed and listed above, alert, oriented, appears well hydrated and in no acute distress  HEENT: atraumatic, conjunttiva clear, no obvious abnormalities on inspection of external nose and ears  NECK: no obvious masses on inspection  LUNGS: clear to auscultation bilaterally, no wheezes, rales or rhonchi, good air movement  CV: HRRR, no peripheral edema  MS: moves all extremities without noticeable abnormality bilat BKA  PSYCH: pleasant and cooperative, no obvious depression or anxiety  ASSESSMENT AND PLAN:  Discussed the following assessment and plan:  S/P bilateral BKA (below knee amputation) (HCC)  Chronic systolic congestive heart failure (HCC)  Type 2 diabetes mellitus with diabetic nephropathy, with long-term current use of insulin (HCC)  -reviewed PT evaluation and recommendations and agree -given degree of heart failure and BKA and risks of pressure ulcers would not be able to operate manual wheelchair effectively and would benefit from recommended DME -forms completed, signed, copied and given to patient along with copy of OV from today  -Patient advised to return or notify a doctor immediately if symptoms worsen or persist or new concerns arise.  There are no Patient Instructions on file for this visit.   Kriste Basque R.

## 2015-07-30 DIAGNOSIS — I2510 Atherosclerotic heart disease of native coronary artery without angina pectoris: Secondary | ICD-10-CM | POA: Diagnosis not present

## 2015-07-30 DIAGNOSIS — I130 Hypertensive heart and chronic kidney disease with heart failure and stage 1 through stage 4 chronic kidney disease, or unspecified chronic kidney disease: Secondary | ICD-10-CM | POA: Diagnosis not present

## 2015-07-30 DIAGNOSIS — E1122 Type 2 diabetes mellitus with diabetic chronic kidney disease: Secondary | ICD-10-CM | POA: Diagnosis not present

## 2015-07-30 DIAGNOSIS — M159 Polyosteoarthritis, unspecified: Secondary | ICD-10-CM | POA: Diagnosis not present

## 2015-07-30 DIAGNOSIS — N183 Chronic kidney disease, stage 3 (moderate): Secondary | ICD-10-CM | POA: Diagnosis not present

## 2015-07-30 DIAGNOSIS — I5022 Chronic systolic (congestive) heart failure: Secondary | ICD-10-CM | POA: Diagnosis not present

## 2015-07-31 ENCOUNTER — Telehealth: Payer: Self-pay | Admitting: Family Medicine

## 2015-07-31 NOTE — Telephone Encounter (Signed)
For obvious reasons - can not change note without addend ing the note. Can make correction and print corrected note for her. Thanks.

## 2015-07-31 NOTE — Telephone Encounter (Signed)
I left a detailed message with the information below on Sue Martin's voicemail.

## 2015-07-31 NOTE — Telephone Encounter (Signed)
Sue Martin has a question about the face to face note on 07-27-15.Debbie would like md to change note  to power wheel chair instead of scooter. This needs to be done without  an addendum for medicare purposes

## 2015-08-06 DIAGNOSIS — I130 Hypertensive heart and chronic kidney disease with heart failure and stage 1 through stage 4 chronic kidney disease, or unspecified chronic kidney disease: Secondary | ICD-10-CM | POA: Diagnosis not present

## 2015-08-06 DIAGNOSIS — E1122 Type 2 diabetes mellitus with diabetic chronic kidney disease: Secondary | ICD-10-CM | POA: Diagnosis not present

## 2015-08-06 DIAGNOSIS — N183 Chronic kidney disease, stage 3 (moderate): Secondary | ICD-10-CM | POA: Diagnosis not present

## 2015-08-06 DIAGNOSIS — M159 Polyosteoarthritis, unspecified: Secondary | ICD-10-CM | POA: Diagnosis not present

## 2015-08-06 DIAGNOSIS — I5022 Chronic systolic (congestive) heart failure: Secondary | ICD-10-CM | POA: Diagnosis not present

## 2015-08-06 DIAGNOSIS — I2510 Atherosclerotic heart disease of native coronary artery without angina pectoris: Secondary | ICD-10-CM | POA: Diagnosis not present

## 2015-08-13 DIAGNOSIS — I130 Hypertensive heart and chronic kidney disease with heart failure and stage 1 through stage 4 chronic kidney disease, or unspecified chronic kidney disease: Secondary | ICD-10-CM | POA: Diagnosis not present

## 2015-08-13 DIAGNOSIS — I2510 Atherosclerotic heart disease of native coronary artery without angina pectoris: Secondary | ICD-10-CM | POA: Diagnosis not present

## 2015-08-13 DIAGNOSIS — N183 Chronic kidney disease, stage 3 (moderate): Secondary | ICD-10-CM | POA: Diagnosis not present

## 2015-08-13 DIAGNOSIS — E1122 Type 2 diabetes mellitus with diabetic chronic kidney disease: Secondary | ICD-10-CM | POA: Diagnosis not present

## 2015-08-13 DIAGNOSIS — M159 Polyosteoarthritis, unspecified: Secondary | ICD-10-CM | POA: Diagnosis not present

## 2015-08-13 DIAGNOSIS — I5022 Chronic systolic (congestive) heart failure: Secondary | ICD-10-CM | POA: Diagnosis not present

## 2015-08-19 DIAGNOSIS — M159 Polyosteoarthritis, unspecified: Secondary | ICD-10-CM | POA: Diagnosis not present

## 2015-08-19 DIAGNOSIS — I2510 Atherosclerotic heart disease of native coronary artery without angina pectoris: Secondary | ICD-10-CM | POA: Diagnosis not present

## 2015-08-19 DIAGNOSIS — I5022 Chronic systolic (congestive) heart failure: Secondary | ICD-10-CM | POA: Diagnosis not present

## 2015-08-19 DIAGNOSIS — E1122 Type 2 diabetes mellitus with diabetic chronic kidney disease: Secondary | ICD-10-CM | POA: Diagnosis not present

## 2015-08-19 DIAGNOSIS — N183 Chronic kidney disease, stage 3 (moderate): Secondary | ICD-10-CM | POA: Diagnosis not present

## 2015-08-19 DIAGNOSIS — I130 Hypertensive heart and chronic kidney disease with heart failure and stage 1 through stage 4 chronic kidney disease, or unspecified chronic kidney disease: Secondary | ICD-10-CM | POA: Diagnosis not present

## 2015-09-03 ENCOUNTER — Ambulatory Visit: Payer: PRIVATE HEALTH INSURANCE | Admitting: Family Medicine

## 2015-09-04 ENCOUNTER — Ambulatory Visit (INDEPENDENT_AMBULATORY_CARE_PROVIDER_SITE_OTHER): Payer: No Typology Code available for payment source | Admitting: Family Medicine

## 2015-09-04 ENCOUNTER — Encounter: Payer: Self-pay | Admitting: Family Medicine

## 2015-09-04 VITALS — BP 110/80 | HR 77 | Temp 98.6°F | Ht <= 58 in

## 2015-09-04 DIAGNOSIS — E11319 Type 2 diabetes mellitus with unspecified diabetic retinopathy without macular edema: Secondary | ICD-10-CM

## 2015-09-04 DIAGNOSIS — Z89511 Acquired absence of right leg below knee: Secondary | ICD-10-CM

## 2015-09-04 DIAGNOSIS — Z89512 Acquired absence of left leg below knee: Secondary | ICD-10-CM

## 2015-09-04 DIAGNOSIS — I2510 Atherosclerotic heart disease of native coronary artery without angina pectoris: Secondary | ICD-10-CM

## 2015-09-04 DIAGNOSIS — E1165 Type 2 diabetes mellitus with hyperglycemia: Secondary | ICD-10-CM

## 2015-09-04 DIAGNOSIS — I5022 Chronic systolic (congestive) heart failure: Secondary | ICD-10-CM

## 2015-09-04 DIAGNOSIS — N183 Chronic kidney disease, stage 3 unspecified: Secondary | ICD-10-CM

## 2015-09-04 DIAGNOSIS — E785 Hyperlipidemia, unspecified: Secondary | ICD-10-CM

## 2015-09-04 NOTE — Progress Notes (Signed)
Pre visit review using our clinic review tool, if applicable. No additional management support is needed unless otherwise documented below in the visit note. 

## 2015-09-04 NOTE — Progress Notes (Signed)
HPI:  Sue Martin is a 50 yo F with a very complicated medical hx including DM, s/p bilat BKA, CHF with chronic swelling in stumps and fluid imbalance (hospitalized recently) - also seeing nephrologist, CKD, CAD here for follow-up and assistance in completing paperwork for a new wheelchair and for follow up. She was evaluated by PT and they advised a power wheelchair with tilt function to help prevent pressure sores.  Pt reports her cardiologist has also advised a tilting wheelchair to assist in elevation of her stumps. No CP, SOB, increased swelling or skin breakdown today. She will be seeing her cardiologist seen and reports she has been doing quite well. She has had some issues with her allergies. She reports chronic allergies since childhood and has been suffering from constant postnasal drip, nasal congestion and ear fullness. She has not had fevers, worsening shortness of breath or pain. She is not able to tolerate any nasal spray as they cause emesis. She also reports that Benadryl, Claritin and Allegra have not worked for her symptoms. She has not tried Zyrtec.  ROS: See pertinent positives and negatives per HPI.  Past Medical History  Diagnosis Date  . Diabetes mellitus with renal complications (HCC)     PVD and retinopathy, S/p bilat ambutations  . CHF (congestive heart failure) (HCC)     sees Dr. Wynonia HazardKhawaja  . Chronic kidney disease   . Liver cirrhosis (HCC)   . Retinal detachment     sees Dr. Johna SheriffPeter Rogaski per her report  . Lyme disease   . Glaucoma   . CAD (coronary artery disease) 12/12/2014    -s/p 2V PCI of the LAD/RCA with taxus stents -cardiologist is Dr. Carmon SailsKwawaja, Celedonio SavageUsman   . Depression   . 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 Leg amputation below knee  09/09/2011, 09/11/2011  . Tubal ligation    . Lasik      Family History  Problem Relation Age of Onset  . Arthritis Mother   . Heart disease  Mother   . Mental illness Mother   . Diabetes Mother   . Arthritis Maternal Grandmother   . Diabetes Maternal Grandmother   . Arthritis Father   . CVA Father   . Hypertension Father   . Sudden death Paternal Grandfather     Social History   Social History  . Marital Status: Married    Spouse Name: N/A  . Number of Children: N/A  . Years of Education: N/A   Social History Main Topics  . Smoking status: Never Smoker   . Smokeless tobacco: None  . Alcohol Use: No  . Drug Use: No  . Sexual Activity: Not Asked   Other Topics Concern  . None   Social History Narrative   Married   Husband is a Naval architecttruck driver        Current outpatient prescriptions:  .  aspirin 325 MG tablet, Take 325 mg by mouth at bedtime. , Disp: , Rfl:  .  atorvastatin (LIPITOR) 10 MG tablet, Take 1 tablet (10 mg total) by mouth daily at 6 PM., Disp: 90 tablet, Rfl: 3 .  carvedilol (COREG) 6.25 MG tablet, Take 1 tablet (6.25 mg total) by mouth daily., Disp: 120 tablet, Rfl: 3 .  cefpodoxime (VANTIN) 200 MG tablet, Take 1 tablet (200 mg total) by mouth 2 (two) times daily., Disp: 12 tablet, Rfl: 0 .  clopidogrel (PLAVIX) 75 MG tablet, Take  75 mg by mouth., Disp: , Rfl:  .  cyclobenzaprine (FLEXERIL) 10 MG tablet, Take 10 mg by mouth 3 (three) times daily as needed for muscle spasms. , Disp: , Rfl:  .  glucose blood test strip, Check sugars four times daily   Dx E11.9, Disp: , Rfl:  .  hydrALAZINE (APRESOLINE) 10 MG tablet, Take 1 tablet (10 mg total) by mouth 3 (three) times daily., Disp: 270 tablet, Rfl: 3 .  ibuprofen (ADVIL,MOTRIN) 200 MG tablet, Take 200 mg by mouth every 6 (six) hours as needed., Disp: , Rfl:  .  Insulin Glargine (LANTUS SOLOSTAR) 100 UNIT/ML Solostar Pen, Inject 40-55 Units into the skin 2 (two) times daily. 55 units every morning and 40 units every evening., Disp: , Rfl:  .  insulin lispro (HUMALOG) 100 UNIT/ML KiwkPen, Inject 25 units three times a day with meals (Patient taking  differently: Inject 25 units with breakfast and lunch-35 units with dinner), Disp: 30 mL, Rfl: 3 .  isosorbide mononitrate (IMDUR) 30 MG 24 hr tablet, Take 30 mg by mouth 3 (three) times daily. , Disp: , Rfl:  .  metolazone (ZAROXOLYN) 5 MG tablet, Take 1 tablet (5 mg total) by mouth daily. 30 minutes before Demadex dose, Disp: 90 tablet, Rfl: 3 .  ondansetron (ZOFRAN) 4 MG tablet, Take 1 tablet (4 mg total) by mouth every 8 (eight) hours as needed for nausea or vomiting., Disp: 20 tablet, Rfl: 0 .  spironolactone (ALDACTONE) 25 MG tablet, Take 1 tablet (25 mg total) by mouth daily., Disp: 90 tablet, Rfl: 3 .  torsemide (DEMADEX) 20 MG tablet, Take 1 tablet (20 mg total) by mouth 2 (two) times daily., Disp: 180 tablet, Rfl: 3 .  lisinopril (PRINIVIL,ZESTRIL) 10 MG tablet, Take 10 mg by mouth at bedtime. , Disp: , Rfl:   EXAM:  Filed Vitals:   09/04/15 1429  BP: 110/80  Pulse: 77  Temp: 98.6 F (37 C)   Unable to obtain weight There is no weight on file to calculate BMI.  Pt refused weight as would require lifting her off chair onton scale, offered locations that are wheelchair compatible but declined. See PT eval.  GENERAL: vitals reviewed and listed above, alert, oriented, appears well hydrated and in no acute distress  HEENT: atraumatic, conjunttiva clear, no obvious abnormalities on inspection of external nose and ears  NECK: no obvious masses on inspection  LUNGS: clear to auscultation bilaterally, no wheezes, rales or rhonchi, good air movement  CV: HRRR, no peripheral edema  MS: moves all extremities without noticeable abnormality, bilat BKA  PSYCH: pleasant and cooperative, no obvious depression or anxiety  ASSESSMENT AND PLAN:  Discussed the following assessment and plan:  Uncontrolled type 2 diabetes mellitus with retinopathy, without long-term current use of insulin, macular edema presence unspecified, unspecified retinopathy severity (HCC)  CKD (chronic kidney  disease) stage 3, GFR 30-59 ml/min  Chronic systolic congestive heart failure (HCC)  S/P bilateral BKA (below knee amputation) (HCC)  Coronary artery disease involving native coronary artery of native heart without angina pectoris  Hyperlipidemia PLAN: -start zyrtec once nightly and advise allergy evaluation, number provided to call to set up appointment -advised follow up with her endocrinologist regarding her diabetes -advised to schedule gyn exam with pap -advised follow up with cardiology as planned -reviewed PT evaluation and recommendations and agree -given degree of heart failure and BKA and risks of pressure ulcers would not be able to operate manual wheelchair effectively and would benefit from recommended DME (  power wheelchair) -forms completed, signed, copied and given to patient along with copy of OV from today  -Patient advised to return or notify a doctor immediately if symptoms worsen or persist or new concerns arise. -Patient advised to return or notify a doctor immediately if symptoms worsen or persist or new concerns arise.  Patient Instructions  BEFORE YOU LEAVE: -morning appointment in 3 month; come fasting and will plan labs that day  Zyrtec nightly before bed. Please schedule evaluation with the allergist.  Please see your endocrinologist for follow up as soon as possible.  Please follow up with your cardiologist as planned.  Please set up your gynecology visit with pap.     Kriste Basque R.

## 2015-09-04 NOTE — Patient Instructions (Signed)
BEFORE YOU LEAVE: -morning appointment in 3 month; come fasting and will plan labs that day  Zyrtec nightly before bed. Please schedule evaluation with the allergist.  Please see your endocrinologist for follow up as soon as possible.  Please follow up with your cardiologist as planned.  Please set up your gynecology visit with pap.

## 2015-09-14 DIAGNOSIS — I255 Ischemic cardiomyopathy: Secondary | ICD-10-CM | POA: Diagnosis not present

## 2015-09-14 DIAGNOSIS — I5022 Chronic systolic (congestive) heart failure: Secondary | ICD-10-CM | POA: Diagnosis not present

## 2015-09-14 DIAGNOSIS — I10 Essential (primary) hypertension: Secondary | ICD-10-CM | POA: Diagnosis not present

## 2015-09-14 DIAGNOSIS — I2510 Atherosclerotic heart disease of native coronary artery without angina pectoris: Secondary | ICD-10-CM | POA: Diagnosis not present

## 2015-09-14 DIAGNOSIS — E785 Hyperlipidemia, unspecified: Secondary | ICD-10-CM | POA: Diagnosis not present

## 2015-11-17 ENCOUNTER — Ambulatory Visit (INDEPENDENT_AMBULATORY_CARE_PROVIDER_SITE_OTHER): Payer: No Typology Code available for payment source | Admitting: Family Medicine

## 2015-11-17 ENCOUNTER — Encounter: Payer: Self-pay | Admitting: Family Medicine

## 2015-11-17 ENCOUNTER — Telehealth: Payer: Self-pay | Admitting: *Deleted

## 2015-11-17 VITALS — BP 124/82 | HR 85 | Ht <= 58 in | Wt 227.0 lb

## 2015-11-17 DIAGNOSIS — N183 Chronic kidney disease, stage 3 unspecified: Secondary | ICD-10-CM

## 2015-11-17 DIAGNOSIS — E1165 Type 2 diabetes mellitus with hyperglycemia: Secondary | ICD-10-CM

## 2015-11-17 DIAGNOSIS — E11319 Type 2 diabetes mellitus with unspecified diabetic retinopathy without macular edema: Secondary | ICD-10-CM | POA: Diagnosis not present

## 2015-11-17 DIAGNOSIS — I2510 Atherosclerotic heart disease of native coronary artery without angina pectoris: Secondary | ICD-10-CM

## 2015-11-17 DIAGNOSIS — Z89511 Acquired absence of right leg below knee: Secondary | ICD-10-CM

## 2015-11-17 DIAGNOSIS — I5023 Acute on chronic systolic (congestive) heart failure: Secondary | ICD-10-CM

## 2015-11-17 DIAGNOSIS — Z89512 Acquired absence of left leg below knee: Secondary | ICD-10-CM

## 2015-11-17 DIAGNOSIS — I739 Peripheral vascular disease, unspecified: Secondary | ICD-10-CM | POA: Diagnosis not present

## 2015-11-17 DIAGNOSIS — I5022 Chronic systolic (congestive) heart failure: Secondary | ICD-10-CM

## 2015-11-17 NOTE — Progress Notes (Signed)
HPI:  Follow up:  DM: -seeing endocrinology (Dr. Lucianne Muss) and nephrology -meds: lantus 40am 55pm, humalog 25,25,35 per her report -reports home BS random 150-200, no lows -sees optho/legally blind: sees Dr Johna Sheriff -denies hypoglycemia, wounds -complications: s/p bilat LE amputation and on disability for this - reports spent 45 days at [facility] in 2012 for gangrene, PVD, Renal Failure, diabetic retinopathy -she is very happy with her new scooter advised by her cardiologist for elevating stumps  CKD/hypokalemia: -sees nephrology  -reports allergic to all forms of K+ and refuses to take -rarely uses ibuprofen  Hx of CAD, CHF: -Managed by cardiologist in [place] now, Dr. Wynonia Hazard - reports she has a follow-up appointment coming up and that she is in touch with his office on a regular basis regarding her diuretics -hospitalized 12/28-06/28/15 for CHF -S/p stenting Jan of 2007 [facility] in [place], [place] -echo 9/16 with EF 35% -meds: asa, plavix, lipitor, lisinopril? - per notes stopped in hospital then reported restarted by another doctor, torsemide, metalazone, spironolactone, 6.25 bid, hydralazine 10, imdur 30  -denies CP, SOB, swelling, reports weight down  OA/muscle spsm: -knees and back -takes ibuprofen, flexeril rarely for this  ROS: See pertinent positives and negatives per HPI.  Past Medical History  Diagnosis Date  . Diabetes mellitus with renal complications (HCC)     PVD and retinopathy, S/p bilat ambutations  . CHF (congestive heart failure) (HCC)     sees Dr. Wynonia Hazard  . Chronic kidney disease   . Liver cirrhosis (HCC)   . Retinal detachment     sees Dr. Johna Sheriff per her report  . Lyme disease   . Glaucoma   . CAD (coronary artery disease) 12/12/2014    -s/p 2V PCI of the LAD/RCA with taxus stents -cardiologist is Dr. Carmon Sails, Celedonio Savage   . Depression   . S/P bilateral BKA (below knee amputation) (HCC) 11/28/2014    Past Surgical  History  Procedure Laterality Date  . Leg amputation below knee  09/09/2011, 09/11/2011  . Tubal ligation    . Lasik      Family History  Problem Relation Age of Onset  . Arthritis Mother   . Heart disease Mother   . Mental illness Mother   . Diabetes Mother   . Arthritis Maternal Grandmother   . Diabetes Maternal Grandmother   . Arthritis Father   . CVA Father   . Hypertension Father   . Sudden death Paternal Grandfather     Social History   Social History  . Marital Status: Married    Spouse Name: N/A  . Number of Children: N/A  . Years of Education: N/A   Social History Main Topics  . Smoking status: Never Smoker   . Smokeless tobacco: None  . Alcohol Use: No  . Drug Use: No  . Sexual Activity: Not Asked   Other Topics Concern  . None   Social History Narrative   Married   Husband is a Naval architect        Current outpatient prescriptions:  .  aspirin 325 MG tablet, Take 325 mg by mouth at bedtime. , Disp: , Rfl:  .  atorvastatin (LIPITOR) 10 MG tablet, Take 1 tablet (10 mg total) by mouth daily at 6 PM., Disp: 90 tablet, Rfl: 3 .  carvedilol (COREG) 6.25 MG tablet, Take 1 tablet (6.25 mg total) by mouth daily., Disp: 120 tablet, Rfl: 3 .  cefpodoxime (VANTIN) 200 MG tablet, Take 1 tablet (200 mg  total) by mouth 2 (two) times daily., Disp: 12 tablet, Rfl: 0 .  clopidogrel (PLAVIX) 75 MG tablet, Take 75 mg by mouth., Disp: , Rfl:  .  cyclobenzaprine (FLEXERIL) 10 MG tablet, Take 10 mg by mouth 3 (three) times daily as needed for muscle spasms. , Disp: , Rfl:  .  glucose blood test strip, Check sugars four times daily   Dx E11.9, Disp: , Rfl:  .  hydrALAZINE (APRESOLINE) 10 MG tablet, Take 1 tablet (10 mg total) by mouth 3 (three) times daily., Disp: 270 tablet, Rfl: 3 .  ibuprofen (ADVIL,MOTRIN) 200 MG tablet, Take 200 mg by mouth every 6 (six) hours as needed., Disp: , Rfl:  .  Insulin Glargine (LANTUS SOLOSTAR) 100 UNIT/ML Solostar Pen, Inject 40-55 Units into  the skin 2 (two) times daily. 55 units every morning and 40 units every evening., Disp: , Rfl:  .  insulin lispro (HUMALOG) 100 UNIT/ML KiwkPen, Inject 25 units three times a day with meals (Patient taking differently: Inject 25 units with breakfast and lunch-35 units with dinner), Disp: 30 mL, Rfl: 3 .  isosorbide mononitrate (IMDUR) 30 MG 24 hr tablet, Take 30 mg by mouth 3 (three) times daily. , Disp: , Rfl:  .  metolazone (ZAROXOLYN) 5 MG tablet, Take 1 tablet (5 mg total) by mouth daily. 30 minutes before Demadex dose, Disp: 90 tablet, Rfl: 3 .  ondansetron (ZOFRAN) 4 MG tablet, Take 1 tablet (4 mg total) by mouth every 8 (eight) hours as needed for nausea or vomiting., Disp: 20 tablet, Rfl: 0 .  spironolactone (ALDACTONE) 25 MG tablet, Take 1 tablet (25 mg total) by mouth daily., Disp: 90 tablet, Rfl: 3 .  torsemide (DEMADEX) 20 MG tablet, Take 1 tablet (20 mg total) by mouth 2 (two) times daily., Disp: 180 tablet, Rfl: 3 .  lisinopril (PRINIVIL,ZESTRIL) 10 MG tablet, Take 10 mg by mouth at bedtime. , Disp: , Rfl:   EXAM:  Filed Vitals:   11/17/15 1107  BP: 124/82  Pulse: 85    Body mass index is 52.76 kg/(m^2).  GENERAL: vitals reviewed and listed above, alert, oriented, appears well hydrated and in no acute distress  HEENT: atraumatic, conjunttiva clear, no obvious abnormalities on inspection of external nose and ears  NECK: no obvious masses on inspection  LUNGS: clear to auscultation bilaterally, no wheezes, rales or rhonchi, good air movement  CV: HRRR, no peripheral edema  MS: moves all extremities without noticeable abnormality  PSYCH: pleasant and cooperative, no obvious depression or anxiety  ASSESSMENT AND PLAN:  Discussed the following assessment and plan:  Uncontrolled type 2 diabetes mellitus with retinopathy, without long-term current use of insulin, macular edema presence unspecified, unspecified retinopathy severity (HCC)  CKD (chronic kidney disease)  stage 3, GFR 30-59 ml/min  Acute on chronic systolic heart failure (HCC)  Diabetic retinopathy associated with type 2 diabetes mellitus, macular edema presence unspecified, unspecified retinopathy severity (HCC)  PVD (peripheral vascular disease) (HCC)  Chronic systolic congestive heart failure (HCC)  S/P bilateral BKA (below knee amputation) (HCC)  -advised endo follow up for her diabetes and offered assistance in scheduling. In interim opted to increase her insulin a little and spent some time discussing how she should monitor her blood sugars. Blood sugar log provided and advised that she check fasting and postprandial blood sugars and keep a log to take to her endocrine appointment. She refused labs today and refused a point-of-care hemoglobin A1c. -We also discussed the importance of healthy regular meals -  I have repetitively advised her to do gyn physical with pap and offered here -she refused here and reported she would schedule with gyn, again advised and offered to assist in setting up - she now reports that she sees a gynecologist every year in [place] and is up-to-date on her Pap smears. She does not remember the name of her gynecologist, but agrees to call us with this information so that we can update her health maintenance. -advised assistant tocheck with cardiologist to confirm BP med then refill lisinopril if needed -Patient advised to return or notify a doctor immediately if symptoms worsen or persist or new concerns arise.  Patient Instructions  BEFORE YOU LEAVE: -blood sugar log -set up follow up in about 4 months  Call you diabetes doctor today to set up a follow up appointment.  Follow up with your cardiologist as planned  Please let us know the name of your gynecologist and call their office to make sure you are up to date on your pap smear.  Instructions for checking your sugar per diabetes doctor notes: Check blood sugars on waking up . This is called  FASTING Also check blood sugars about 2 hours after a meal and do this after different meals by rotation  Recommended blood sugar levels on waking up is 90-130 and about 2 hours after meal is 140-180 Please bring blood sugar monitor to each visit.   Increase the morning lantus a little to 43 units.   Then monitor blood sugars to take to endocrinology appointment for further adjustment of insulin. You likely need more insulin with meals and will need you log at your appointment to help make adjustment.  We recommend the following healthy lifestyle measures: - eat a healthy whole foods diet consisting of regular small meals composed of vegetables, fruits, beans, nuts, seeds, healthy meats such as white chicken and fish - avoid sweets, white starchy foods, fried foods, fast food, processed foods, sodas, red meet  - get a least 150-300 minutes of aerobic exercise per week.       Kriste Basque R.

## 2015-11-17 NOTE — Patient Instructions (Addendum)
BEFORE YOU LEAVE: -blood sugar log -set up follow up in about 4 months  Call you diabetes doctor today to set up a follow up appointment.  Follow up with your cardiologist as planned  Please let us know the name of your gynecologist and call their office to make sure you are up to date on your pap smear.  Instructions for checking your sugar per diabetes doctor notes: Check blood sugars on waking up . This is called FASTING Also check blood sugars about 2 hours after a meal and do this after different meals by rotation  Recommended blood sugar levels on waking up is 90-130 and about 2 hours after meal is 140-180 Please bring blood sugar monitor to each visit.   Increase the morning lantus a little to 43 units.   Then monitor blood sugars to take to endocrinology appointment for further adjustment of insulin. You likely need more insulin with meals and will need you log at your appointment to help make adjustment.  We recommend the following healthy lifestyle measures: - eat a healthy whole foods diet consisting of regular small meals composed of vegetables, fruits, beans, nuts, seeds, healthy meats such as white chicken and fish - avoid sweets, white starchy foods, fried foods, fast food, processed foods, sodas, red meet  - get a least 150-300 minutes of aerobic exercise per week.

## 2015-11-17 NOTE — Progress Notes (Signed)
Pre visit review using our clinic review tool, if applicable. No additional management support is needed unless otherwise documented below in the visit note. 

## 2015-11-17 NOTE — Telephone Encounter (Signed)
Per Dr Selena BattenKim I called the cardiologist- Dr Lois HuxleyKhawaja's office and asked if the pt was to be taking Lisinopril and Dr Selena BattenKim stated if she is to be taking this we can give #90 with 3 refills.  Darlene,CMA called me back and stated per the discharge summary dated 06/28/2015 the pt was to discontinue this and she is faxing a copy of this for Dr Selena BattenKim to see as well.

## 2015-11-18 NOTE — Telephone Encounter (Signed)
Ok. Does not look like we had refilled recently. Please let pt know that her cardiologist said this was discontinued. If another doctor told her to take this and prescribed, please get notes and please let her cardiologist know. Thanks.

## 2015-11-18 NOTE — Telephone Encounter (Signed)
Left message for patient to return phone call.  

## 2015-11-24 NOTE — Telephone Encounter (Signed)
I called the pt and informed her Dr Selena BattenKim received notes from her heart doctor which recommend she discontinue this after being discharged from the hospital in January.  She stated she forgot and has been taking this all along as this was in her medicine bag and she ran out.  I advised that she call her heart doctor's office to let them know this and she agreed.

## 2015-11-26 ENCOUNTER — Ambulatory Visit: Payer: No Typology Code available for payment source | Admitting: Family Medicine

## 2015-12-04 ENCOUNTER — Ambulatory Visit: Payer: No Typology Code available for payment source | Admitting: Family Medicine

## 2015-12-05 ENCOUNTER — Other Ambulatory Visit: Payer: Self-pay | Admitting: Endocrinology

## 2015-12-07 ENCOUNTER — Telehealth: Payer: Self-pay | Admitting: Family Medicine

## 2015-12-07 MED ORDER — INSULIN GLARGINE 100 UNIT/ML SOLOSTAR PEN: 40.0000 [IU] | PEN_INJECTOR | Freq: Two times a day (BID) | SUBCUTANEOUS | Status: DC

## 2015-12-07 MED ORDER — INSULIN LISPRO 100 UNIT/ML (KWIKPEN): PEN_INJECTOR | SUBCUTANEOUS | Status: DC

## 2015-12-07 NOTE — Telephone Encounter (Signed)
That is in 2 days. Is she out? I don't see any phone notes that she called Dr. Lucianne MussKumar to request refill. If out, ok to refill only #1 of each to get to the appointment.

## 2015-12-07 NOTE — Telephone Encounter (Signed)
Pt has an appt to see dr Lucianne Musskumar on 12-10-15 and he will not refill the medication until then. Pt needs a refill on humalog and lantus call into [facility] [address].

## 2015-12-07 NOTE — Addendum Note (Signed)
Addended by: Johnella MoloneyFUNDERBURK, Deniyah Dillavou A on: 12/07/2015 05:25 PM   Modules accepted: Orders

## 2015-12-07 NOTE — Telephone Encounter (Signed)
I called [facility] and spoke with Selena BattenKim and she stated the pens only come in a box of 5 and they cannot break this.  I asked that she disregard the other 2 Rxs that were sent and she is aware of this.

## 2015-12-07 NOTE — Telephone Encounter (Signed)
I called the pt and informed her of the message below.  She stated she is out of the medication and I informed her one pen of each was sent to her pharmacy.  She stated the pharmacy will not give her one pen and she needs to have 3 of them sent which would be a 1 month supply.  Message forwarded back to Dr Selena BattenKim.

## 2015-12-07 NOTE — Telephone Encounter (Signed)
Please check with pharmacy. Ok to do 3 pens if needed. Thanks. Again, though, just want to make sure she does  follow up with her diabetes doctor. Thanks.

## 2015-12-07 NOTE — Telephone Encounter (Signed)
Rxs re-sent and I called the pt and informed her of this.

## 2015-12-08 ENCOUNTER — Other Ambulatory Visit (INDEPENDENT_AMBULATORY_CARE_PROVIDER_SITE_OTHER): Payer: Medicare Other

## 2015-12-08 DIAGNOSIS — E1139 Type 2 diabetes mellitus with other diabetic ophthalmic complication: Secondary | ICD-10-CM | POA: Diagnosis not present

## 2015-12-08 DIAGNOSIS — E1165 Type 2 diabetes mellitus with hyperglycemia: Secondary | ICD-10-CM | POA: Diagnosis not present

## 2015-12-08 DIAGNOSIS — IMO0002 Reserved for concepts with insufficient information to code with codable children: Secondary | ICD-10-CM

## 2015-12-08 LAB — BASIC METABOLIC PANEL
BUN: 21 mg/dL (ref 6–23)
CHLORIDE: 99 meq/L (ref 96–112)
CO2: 31 mEq/L (ref 19–32)
Calcium: 9.3 mg/dL (ref 8.4–10.5)
Creatinine, Ser: 1.04 mg/dL (ref 0.40–1.20)
GFR: 59.89 mL/min — AB (ref >60.00)
GLUCOSE: 113 mg/dL — AB (ref 70–99)
POTASSIUM: 4.1 meq/L (ref 3.5–5.1)
SODIUM: 136 meq/L (ref 135–145)

## 2015-12-10 ENCOUNTER — Ambulatory Visit (INDEPENDENT_AMBULATORY_CARE_PROVIDER_SITE_OTHER): Payer: No Typology Code available for payment source | Admitting: Endocrinology

## 2015-12-10 ENCOUNTER — Other Ambulatory Visit: Payer: Self-pay

## 2015-12-10 ENCOUNTER — Encounter: Payer: Self-pay | Admitting: Endocrinology

## 2015-12-10 VITALS — BP 138/88 | HR 82

## 2015-12-10 DIAGNOSIS — I2510 Atherosclerotic heart disease of native coronary artery without angina pectoris: Secondary | ICD-10-CM

## 2015-12-10 DIAGNOSIS — Z794 Long term (current) use of insulin: Secondary | ICD-10-CM

## 2015-12-10 DIAGNOSIS — E1165 Type 2 diabetes mellitus with hyperglycemia: Secondary | ICD-10-CM

## 2015-12-10 MED ORDER — INSULIN GLARGINE 100 UNIT/ML SOLOSTAR PEN: 40.0000 [IU] | PEN_INJECTOR | Freq: Two times a day (BID) | SUBCUTANEOUS | Status: DC

## 2015-12-10 MED ORDER — INSULIN LISPRO 100 UNIT/ML (KWIKPEN): PEN_INJECTOR | SUBCUTANEOUS | Status: DC

## 2015-12-10 MED ORDER — ALBIGLUTIDE 30 MG ~~LOC~~ PEN: PEN_INJECTOR | SUBCUTANEOUS | Status: DC

## 2015-12-10 NOTE — Progress Notes (Signed)
Patient ID: Sue Martin, female   DOB: 12-25-1965, 50 y.o.   MRN: [id number]           Reason for Appointment: Follow-up for Type 2 Diabetes  Referring physician: None  History of Present Illness:          Date of diagnosis of type 2 diabetes mellitus: ?  2007        Background history:  Sue Martin had gestational diabetes several years ago Subsequently Sue Martin was not monitored and apparently in 2007 Sue Martin was diagnosed to have diabetes when Sue Martin was being evaluated for cardiac problems Pneumonia.  Her blood sugar was about 700 Sue Martin was started on insulin and has only been on insulin since diagnosis Sue Martin thinks her blood sugars have been usually very difficult to control and has been generally followed by her primary care physician only Her A1c record indicates her level has been mostly around 10-11 in 2015 and early 2016  Recent history:  On her initial consultation because of her high A1c of 8.9% Sue Martin was told to increase her insulin doses Since he had vomiting with Trulicity Sue Martin was told to start Tanzeum on her last visit but Sue Martin did not do so  Sue Martin has not been seen in follow-up since 8/16 and came in today because Sue Martin needed a new prescription for insulin  INSULIN regimen is described as: 55-40 units of Lantus bid; Humalog 25-25-35 units with meals     Current blood sugar patterns and problems identified:  Sue Martin has much worse control of her diabetes now and her A1c has gone up  Sue Martin has not checked her blood sugars and 2 or 3 months as Sue Martin ran out of test strips.  Also Sue Martin can only use Prodigy meter because of her visual difficulties  Sue Martin blames the high sugars and lack of motivation to stress and depression.  Sue Martin is also tending to eat more later at night and not watch her diet  Difficult to check her weight because of bilateral amputation  Her blood sugar appears to be relatively lower in the afternoon but Sue Martin is eating lighter lunch even with taking 25 units of Humalog.  Occasionally may  feel a little hypoglycemic in the afternoon also  Sue Martin does not adjust her Humalog based on what Sue Martin is eating and is taking the same dose consistently  Compliance with the medical regimen: Poor Hypoglycemia: As above    Glucose monitoring:  done 3-4 times a day         Glucometer: Prodigy.      Blood Glucose readings none  Self-care: The diet that the patient has been following is: None      Meal times: Breakfast: 10 am Lunch: 3-4 pm, usually not eating a full dinner   Typical meal intake: Breakfast is his usually a granola bar or light yogurt, lunch may be sandwich or just light snack and soups, dinner is usually yogurt or other snacks like fruit or sugar-free cookies               Dietician visit, most recent: none, only in the hospital               Exercise:  unable to do any  Weight history: Around the time of diagnosis overweight was 350, recently has been fairly stable around 175  Wt Readings from Last 3 Encounters:  11/17/15 226 lb 15.8 oz (102.961 kg)  02/20/15 226 lb 15.8 oz (102.962 kg)    Glycemic control:  Lab Results  Component Value Date   HGBA1C 10.4 12/11/2015   HGBA1C 9.3 07/14/2015   HGBA1C 8.9* 12/01/2014   Lab Results  Component Value Date   MICROALBUR 17.8* 12/22/2014   LDLCALC 97 12/01/2014   CREATININE 1.04 12/08/2015         Medication List       This list is accurate as of: 12/10/15 11:59 PM.  Always use your most recent med list.               Albiglutide 30 MG Pen  Commonly known as:  TANZEUM  Inject 30 mg weekly     aspirin 325 MG tablet  Take 325 mg by mouth at bedtime.     atorvastatin 10 MG tablet  Commonly known as:  LIPITOR  Take 1 tablet (10 mg total) by mouth daily at 6 PM.     carvedilol 6.25 MG tablet  Commonly known as:  COREG  Take 1 tablet (6.25 mg total) by mouth daily.     cefpodoxime 200 MG tablet  Commonly known as:  VANTIN  Take 1 tablet (200 mg total) by mouth 2 (two) times daily.     cyclobenzaprine  10 MG tablet  Commonly known as:  FLEXERIL  Take 10 mg by mouth 3 (three) times daily as needed for muscle spasms.     hydrALAZINE 10 MG tablet  Commonly known as:  APRESOLINE  Take 1 tablet (10 mg total) by mouth 3 (three) times daily.     ibuprofen 200 MG tablet  Commonly known as:  ADVIL,MOTRIN  Take 200 mg by mouth every 6 (six) hours as needed.     Insulin Glargine 100 UNIT/ML Solostar Pen  Commonly known as:  LANTUS SOLOSTAR  Inject 40-55 Units into the skin 2 (two) times daily. 55 units every morning and 40 units every evening.     insulin lispro 100 UNIT/ML KiwkPen  Commonly known as:  HUMALOG  Inject 25 units with breakfast and lunch-35 units with dinner     isosorbide mononitrate 30 MG 24 hr tablet  Commonly known as:  IMDUR  Take 30 mg by mouth 3 (three) times daily.     lisinopril 10 MG tablet  Commonly known as:  PRINIVIL,ZESTRIL  Take 10 mg by mouth at bedtime.     metolazone 5 MG tablet  Commonly known as:  ZAROXOLYN  Take 1 tablet (5 mg total) by mouth daily. 30 minutes before Demadex dose     ondansetron 4 MG tablet  Commonly known as:  ZOFRAN  Take 1 tablet (4 mg total) by mouth every 8 (eight) hours as needed for nausea or vomiting.     PLAVIX 75 MG tablet  Generic drug:  clopidogrel  Take 75 mg by mouth.     spironolactone 25 MG tablet  Commonly known as:  ALDACTONE  Take 1 tablet (25 mg total) by mouth daily.     torsemide 20 MG tablet  Commonly known as:  DEMADEX  Take 1 tablet (20 mg total) by mouth 2 (two) times daily.        Allergies:  Allergies  Allergen Reactions  . Broccoli [Brassica Oleracea Italica] Anaphylaxis  . Influenza Vaccines Anaphylaxis    Per patient  . Pneumococcal Vaccines Anaphylaxis    Per patient  . Trulicity [Dulaglutide] Hives and Nausea And Vomiting  . Iron Other (See Comments)    GI intolerance  . Latex Other (See Comments)    blisters  . Lisinopril  Nausea Only  . Penicillins Other (See Comments)    GI  intolerance, UTI, can tolerate in IV Tolerates cephalosporins  . Tape Other (See Comments)    Tears skin.  Please use "paper" tape  . Codeine Nausea And Vomiting and Rash  . Sulfa Antibiotics Nausea And Vomiting and Rash  . Sulfur Nausea And Vomiting and Rash    Past Medical History  Diagnosis Date  . Diabetes mellitus with renal complications (HCC)     PVD and retinopathy, S/p bilat ambutations  . CHF (congestive heart failure) (HCC)     sees Dr. Wynonia HazardKhawaja  . Chronic kidney disease   . Liver cirrhosis (HCC)   . Retinal detachment     sees Dr. Johna SheriffPeter Rogaski per her report  . Lyme disease   . Glaucoma   . CAD (coronary artery disease) 12/12/2014    -s/p 2V PCI of the LAD/RCA with taxus stents -cardiologist is Dr. Carmon SailsKwawaja, Celedonio SavageUsman   . Depression   . 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 Leg amputation below knee  09/09/2011, 09/11/2011  . Tubal ligation    . Lasik      Family History  Problem Relation Age of Onset  . Arthritis Mother   . Heart disease Mother   . Mental illness Mother   . Diabetes Mother   . Arthritis Maternal Grandmother   . Diabetes Maternal Grandmother   . Arthritis Father   . CVA Father   . Hypertension Father   . Sudden death Paternal Grandfather     Social History:  reports that Sue Martin has never smoked. Sue Martin does not have any smokeless tobacco history on file. Sue Martin reports that Sue Martin does not drink alcohol or use illicit drugs.    Review of Systems    Lipid history: On Lipitor for the last few years, taking 10 mg   Lab Results  Component Value Date   CHOL 161 12/01/2014   HDL 34.80* 12/01/2014   LDLCALC 97 12/01/2014   TRIG 147.0 12/01/2014   CHOLHDL 5 12/01/2014           Eyes:  history of significant blurred vision since about 2005.  Most recent eye exam was 2013  Hypertension: None, on medications for CHF mostly    Physical Examination:  BP 138/88 mmHg  Pulse 82   Wt   SpO2 90%           MUSCULOSKELETAL:  Sue Martin has bilateral below-knee amputations   Depression: Sue Martin is apparently going to get therapy for this  ASSESSMENT:  Diabetes type 2, uncontrolled with obesity See history of present illness for detailed discussion of current diabetes management, blood sugar patterns and problems identified Sue Martin has poor control of her diabetes as judged by her A1c Without any home glucose monitoring difficult to know what her blood sugar patterns are Sue Martin is poorly compliant with diet especially in the evenings Also is very inactive  Sue Martin needs to be on a GLP-1 drug because of her obesity and insulin resistance Since Sue Martin had  vomiting with Trulicity will have her try Tanzeum as this will have less GI side effects   PLAN:   Start Tanzeum 30 mg weekly.    Sue Martin will try to do this regularly and call if having any difficulties using this Sue Martin needs to be on a drug like Invokana but currently on multiple diuretics and Sue Martin thinks Sue Martin is attending fluid.  Sue Martin  will check with her cardiologist about starting this while adjusting her diuretics.  Discussed action of SGLT 2 drugs on lowering glucose by decreasing kidney absorption of glucose, benefits of weight loss and lower blood pressure, possible side effects including candidiasis and dosage regimen   Sue Martin will increase her suppertime dose to 40 units and may need to adjust this further, however May also need to reduce insulin doses if Sue Martin is improving her compliance and benefiting from Tanzeum  Sue Martin will keep her blood sugar diary for her blood sugars at least for the week or 2 before her visit  Low-fat diet and control portions better in the evening  Discussed timing and targets of blood sugars  No change in Lantus as yet  Patient Instructions  Check blood sugars on waking up 4-5  times a week  Also check blood sugars about 2 hours after a meal and do this after different meals by rotation  Recommended blood  sugar levels on waking up is 90-130 and about 2 hours after meal is 130-160  Please bring your blood sugar monitor to each visit, thank you  Reduce Humalog to 20 at lunch and 40 at supper  Tanzeum 30mg  weekly   Counseling time on subjects discussed above is over 50% of today's 25 minute visit   Aruna Nestler 12/11/2015, 12:01 PM   Note: This office note was prepared with Insurance underwriter. Any transcriptional errors that result from this process are unintentional.

## 2015-12-10 NOTE — Patient Instructions (Addendum)
Check blood sugars on waking up 4-5  times a week  Also check blood sugars about 2 hours after a meal and do this after different meals by rotation  Recommended blood sugar levels on waking up is 90-130 and about 2 hours after meal is 130-160  Please bring your blood sugar monitor to each visit, thank you  Reduce Humalog to 20 at lunch and 40 at supper  Tanzeum 30mg  weekly

## 2015-12-11 ENCOUNTER — Other Ambulatory Visit: Payer: Self-pay

## 2015-12-11 ENCOUNTER — Telehealth: Payer: Self-pay | Admitting: Endocrinology

## 2015-12-11 LAB — GLUCOSE, POCT (MANUAL RESULT ENTRY): POC Glucose: 74 mg/dl (ref 70–99)

## 2015-12-11 LAB — POCT GLYCOSYLATED HEMOGLOBIN (HGB A1C): Hemoglobin A1C: 10.4

## 2015-12-11 MED ORDER — INSULIN GLARGINE 100 UNIT/ML SOLOSTAR PEN: 40.0000 [IU] | PEN_INJECTOR | Freq: Two times a day (BID) | SUBCUTANEOUS | Status: DC

## 2015-12-11 MED ORDER — INSULIN LISPRO 100 UNIT/ML (KWIKPEN): PEN_INJECTOR | SUBCUTANEOUS | Status: DC

## 2015-12-11 NOTE — Telephone Encounter (Signed)
Patient stated she need a refill of Humalog and Lantus 30 days instead if 15 days. Send to [facility]

## 2015-12-11 NOTE — Telephone Encounter (Signed)
Will not do any PA for the Tanzeum as yet

## 2015-12-11 NOTE — Telephone Encounter (Signed)
Tanzeum came back needing a PA. Lantus and humalog have been changed and resubmitted.

## 2015-12-11 NOTE — Telephone Encounter (Signed)
Please check the prescription, also did she get the Tanzeum ?

## 2015-12-30 ENCOUNTER — Telehealth: Payer: Self-pay | Admitting: Endocrinology

## 2015-12-30 NOTE — Telephone Encounter (Signed)
Will need to do PA for Tanzeum.  Try sending Novolog instead of Humalog

## 2015-12-30 NOTE — Telephone Encounter (Signed)
See below. Tanzeum and Humalog both require a PA.

## 2015-12-30 NOTE — Telephone Encounter (Signed)
PT called, she was checking in on a PA that was for one of her new medications (didn't know the name).  The pharmacy told her they are waiting for us to complete PA forms that were sent.  PT requests call back.

## 2015-12-31 NOTE — Telephone Encounter (Signed)
I contacted the pt and advised of note below. Pt stated this would not be an option for her either because she cannot use the vial of insulin. She can only use the pens.

## 2015-12-31 NOTE — Telephone Encounter (Signed)
When she uses up her Humalog she can switch to Novolin R [facility] brand, same doses with a syringe but will need to get Tanzeum to help her with better control, weight loss and reduce insulin

## 2015-12-31 NOTE — Telephone Encounter (Signed)
I contacted the pt and discussed changing the novolog to humalog. Pt states she currently has a 1 month supply of Humalog and stated if we changed to Novolog she would not be able to pick up the Tanzeum even if insurance approved the PA.  Please advise, Thanks!

## 2016-01-05 ENCOUNTER — Telehealth: Payer: Self-pay | Admitting: Family Medicine

## 2016-01-05 NOTE — Telephone Encounter (Signed)
Pt would like a referral to an eye dr.   Rock NephewPt has been going to [place] and  would like a eye dr in [place].

## 2016-01-06 NOTE — Telephone Encounter (Signed)
Can you give her the list of eye docs? If she needs a referral ok to refer. Thanks!

## 2016-01-07 NOTE — Telephone Encounter (Signed)
I called the pt and informed her of the message below and she stated she will call her insurance first to see if a referral is needed.  She also stated she wants to see a psychiatrist or someone to help with getting her cat to be medically needed and I told her I am not sure but she could check with her insurance on this also.

## 2016-01-13 ENCOUNTER — Ambulatory Visit: Payer: No Typology Code available for payment source | Admitting: Endocrinology

## 2016-02-09 ENCOUNTER — Ambulatory Visit (INDEPENDENT_AMBULATORY_CARE_PROVIDER_SITE_OTHER): Payer: Medicare Other | Admitting: Family Medicine

## 2016-02-09 ENCOUNTER — Encounter: Payer: Self-pay | Admitting: Family Medicine

## 2016-02-09 VITALS — BP 128/90 | HR 80 | Temp 99.3°F | Ht <= 58 in

## 2016-02-09 DIAGNOSIS — K760 Fatty (change of) liver, not elsewhere classified: Secondary | ICD-10-CM | POA: Diagnosis not present

## 2016-02-09 DIAGNOSIS — N183 Chronic kidney disease, stage 3 unspecified: Secondary | ICD-10-CM

## 2016-02-09 DIAGNOSIS — K7460 Unspecified cirrhosis of liver: Secondary | ICD-10-CM

## 2016-02-09 DIAGNOSIS — E785 Hyperlipidemia, unspecified: Secondary | ICD-10-CM | POA: Diagnosis not present

## 2016-02-09 DIAGNOSIS — I739 Peripheral vascular disease, unspecified: Secondary | ICD-10-CM

## 2016-02-09 DIAGNOSIS — Z794 Long term (current) use of insulin: Secondary | ICD-10-CM | POA: Diagnosis not present

## 2016-02-09 DIAGNOSIS — I2510 Atherosclerotic heart disease of native coronary artery without angina pectoris: Secondary | ICD-10-CM | POA: Diagnosis not present

## 2016-02-09 DIAGNOSIS — E1121 Type 2 diabetes mellitus with diabetic nephropathy: Secondary | ICD-10-CM | POA: Diagnosis not present

## 2016-02-09 DIAGNOSIS — I5022 Chronic systolic (congestive) heart failure: Secondary | ICD-10-CM | POA: Diagnosis not present

## 2016-02-09 LAB — HEPATIC FUNCTION PANEL
ALT: 7 U/L (ref 0–35)
AST: 11 U/L (ref 0–37)
Albumin: 4 g/dL (ref 3.5–5.2)
Alkaline Phosphatase: 87 U/L (ref 39–117)
BILIRUBIN TOTAL: 1.4 mg/dL — AB (ref 0.2–1.2)
Bilirubin, Direct: 0.4 mg/dL — ABNORMAL HIGH (ref 0.0–0.3)
TOTAL PROTEIN: 6.6 g/dL (ref 6.0–8.3)

## 2016-02-09 LAB — CBC
HEMATOCRIT: 50.8 % — AB (ref 36.0–46.0)
HEMOGLOBIN: 17.4 g/dL — AB (ref 12.0–15.0)
MCHC: 34.2 g/dL (ref 30.0–36.0)
MCV: 88.5 fl (ref 78.0–100.0)
Platelets: 134 10*3/uL — ABNORMAL LOW (ref 150.0–400.0)
RBC: 5.74 Mil/uL — ABNORMAL HIGH (ref 3.87–5.11)
RDW: 16.7 % — AB (ref 11.5–15.5)
WBC: 7.8 10*3/uL (ref 4.0–10.5)

## 2016-02-09 LAB — LIPID PANEL
CHOLESTEROL: 188 mg/dL (ref 0–200)
HDL: 36.4 mg/dL — AB (ref >39.00)
LDL Cholesterol: 125 mg/dL — ABNORMAL HIGH (ref 0–99)
NonHDL: 151.64
TRIGLYCERIDES: 135 mg/dL (ref 0.0–149.0)
Total CHOL/HDL Ratio: 5
VLDL: 27 mg/dL (ref 0.0–40.0)

## 2016-02-09 NOTE — Progress Notes (Signed)
HPI:  Follow up:  DM w/ DKE, retinopathy, PVD: -seeing endocrinology (Dr. Lucianne MussKumar) and nephrology for management -meds: insulin, reports waiting on approval for tanzeum -sees optho/legally blind: sees Dr Johna SheriffPeter Rogaski - requested change to a [place] doc recently -denies hypoglycemia, wounds -complications: s/p bilat LE amputation and on disability for this - reports [facility] for gangrene, PVD, Renal Failure, diabetic retinopathy -last hgba1c 10.4 11/2015 -LDL < 100 11/2014  CKD/hypokalemia: -sees nephrology  -reports allergic to all forms of K+ and refuses to take -rarely uses ibuprofen -Cr 1.04 11/2015  Hx of CAD, CHF: -Managed by cardiologist in [place] now, Dr. Wynonia HazardKhawaja  -hospitalized 12/28-06/28/15 for CHF -S/p stenting Jan of 2007 [facility] [place] -reports had echo recently with EF 35% -meds: asa, plavix, lipitor, lisinopril, torsemide, metalazone, spironolactone, 6.25 bid, hydralazine 10, imdur 30  -denies CP, SOB, swelling, reports weight down  OA/muscle spsm: -knees and back -takes ibuprofen, flexeril rarely for this  Hx ? Cirrhosis/Fatty liver dz: -reports she is unsure of which; reports dx many years  -no abd pain, change in bowels -wants to see GI as does not have GI doc -on [facility] in 2013 US and CT and hep testing in 2013 with ? Cirrhosis/fatty steatosis  HM: she has refused pap smear here and has repetitively agreed to schedule with a gynecologist.  ROS: See pertinent positives and negatives per HPI.  Past Medical History:  Diagnosis Date  . CAD (coronary artery disease) 12/12/2014   -s/p 2V PCI of the LAD/RCA with taxus stents -cardiologist is Dr. Carmon SailsKwawaja, Celedonio SavageUsman   . CHF (congestive heart failure) (HCC)    sees Dr. Wynonia HazardKhawaja  . Chronic kidney disease   . Depression   . Diabetes mellitus with renal complications (HCC)    PVD and retinopathy, S/p bilat ambutations  . Glaucoma   . Liver cirrhosis (HCC)    . Lyme disease   . Retinal detachment    sees Dr. Johna SheriffPeter Rogaski per her report  . 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 LASIK    . LEG AMPUTATION BELOW KNEE  09/09/2011, 09/11/2011  . TUBAL LIGATION      Family History  Problem Relation Age of Onset  . Arthritis Mother   . Heart disease Mother   . Mental illness Mother   . Diabetes Mother   . Arthritis Maternal Grandmother   . Diabetes Maternal Grandmother   . Arthritis Father   . CVA Father   . Hypertension Father   . Sudden death Paternal Grandfather     Social History   Social History  . Marital status: Married    Spouse name: N/A  . Number of children: N/A  . Years of education: N/A   Social History Main Topics  . Smoking status: Never Smoker  . Smokeless tobacco: None  . Alcohol use No  . Drug use: No  . Sexual activity: Not Asked   Other Topics Concern  . None   Social History Narrative   Married   Husband is a Naval architecttruck driver        Current Outpatient Prescriptions:  .  Albiglutide (TANZEUM) 30 MG PEN, Inject 30 mg weekly, Disp: 4 each, Rfl: 1 .  aspirin 325 MG tablet, Take 325 mg by mouth at bedtime. , Disp: , Rfl:  .  atorvastatin (LIPITOR) 10 MG tablet, Take 1 tablet (10 mg  total) by mouth daily at 6 PM., Disp: 90 tablet, Rfl: 3 .  carvedilol (COREG) 6.25 MG tablet, Take 1 tablet (6.25 mg total) by mouth daily., Disp: 120 tablet, Rfl: 3 .  cefpodoxime (VANTIN) 200 MG tablet, Take 1 tablet (200 mg total) by mouth 2 (two) times daily., Disp: 12 tablet, Rfl: 0 .  clopidogrel (PLAVIX) 75 MG tablet, Take 75 mg by mouth., Disp: , Rfl:  .  cyclobenzaprine (FLEXERIL) 10 MG tablet, Take 10 mg by mouth 3 (three) times daily as needed for muscle spasms. , Disp: , Rfl:  .  hydrALAZINE (APRESOLINE) 10 MG tablet, Take 1 tablet (10 mg total) by mouth 3 (three) times daily., Disp: 270 tablet, Rfl: 3 .  ibuprofen (ADVIL,MOTRIN) 200 MG tablet,  Take 200 mg by mouth every 6 (six) hours as needed., Disp: , Rfl:  .  Insulin Glargine (LANTUS SOLOSTAR) 100 UNIT/ML Solostar Pen, Inject 40-55 Units into the skin 2 (two) times daily. 55 units every morning and 40 units every evening., Disp: 30 mL, Rfl: 2 .  insulin lispro (HUMALOG) 100 UNIT/ML KiwkPen, Inject 25 units with breakfast and lunch-35 units with dinner, Disp: 30 mL, Rfl: 0 .  isosorbide mononitrate (IMDUR) 30 MG 24 hr tablet, Take 30 mg by mouth 3 (three) times daily. , Disp: , Rfl:  .  lisinopril (PRINIVIL,ZESTRIL) 10 MG tablet, Take 10 mg by mouth at bedtime. , Disp: , Rfl:  .  metolazone (ZAROXOLYN) 5 MG tablet, Take 1 tablet (5 mg total) by mouth daily. 30 minutes before Demadex dose, Disp: 90 tablet, Rfl: 3 .  ondansetron (ZOFRAN) 4 MG tablet, Take 1 tablet (4 mg total) by mouth every 8 (eight) hours as needed for nausea or vomiting., Disp: 20 tablet, Rfl: 0 .  spironolactone (ALDACTONE) 25 MG tablet, Take 1 tablet (25 mg total) by mouth daily., Disp: 90 tablet, Rfl: 3 .  torsemide (DEMADEX) 20 MG tablet, Take 1 tablet (20 mg total) by mouth 2 (two) times daily., Disp: 180 tablet, Rfl: 3  EXAM:  Vitals:   02/09/16 1253  BP: 128/90  Pulse: 80  Temp: 99.3 F (37.4 C)    There is no height or weight on file to calculate BMI.  GENERAL: vitals reviewed and listed above, alert, oriented, appears well hydrated and in no acute distress  HEENT: atraumatic, conjunttiva clear, no obvious abnormalities on inspection of external nose and ears  NECK: no obvious masses on inspection  LUNGS: clear to auscultation bilaterally, no wheezes, rales or rhonchi, good air movement  CV: HRRR, no peripheral edema  MS: moves all extremities without noticeable abnormality; om wheelchair, Bilat amputee  PSYCH: pleasant and cooperative, no obvious depression or anxiety  ASSESSMENT AND PLAN:  Discussed the following assessment and plan:  Type 2 diabetes mellitus with diabetic nephropathy,  with long-term current use of insulin (HCC) - Plan: Lipid Panel  Chronic systolic congestive heart failure (HCC)  Coronary artery disease involving native coronary artery of native heart without angina pectoris  PVD (peripheral vascular disease) (HCC)  Hyperlipidemia  CKD (chronic kidney disease) stage 3, GFR 30-59 ml/min  Cirrhosis of liver without ascites, unspecified hepatic cirrhosis type (HCC) - Plan: CBC (no diff), Hepatic Function Panel, Ambulatory referral to Gastroenterology  Fatty liver - Plan: Ambulatory referral to Gastroenterology  -lifestyle recs -cont current medications -advised pap again - offered to refer, she declined -discussed hx liver disease - she reports told liver cirrhosis and fatty liver and had eval in hospital and wants to  see GI as she doesn't know if needs to do anything else for this - had hep testing/ct/US in 2013; referral placed at her request; advise controlling other conditions, limit tylenol/ alcohol, healthy diet, cont statin, weight loss -Patient advised to return or notify a doctor immediately if symptoms worsen or persist or new concerns arise.  Patient Instructions  BEFORE YOU LEAVE: -follow up: 4 months -labs today if fasting; if not fasting schedule fasting labs in next few weeks  We have ordered labs or studies at this visit. It can take up to 1-2 weeks for results and processing. IF results require follow up or explanation, we will call you with instructions. Clinically stable results will be released to your [facility]. If you have not heard from [place] or cannot find your results in [facility] in 2 weeks please contact our office at [phone].  If you are not yet signed up for [facility], please consider signing up.  We recommend the following healthy lifestyle: 1) Small portions - eat off of salad plate instead of dinner plate 2) Eat a healthy clean diet with avoidance of (less then 1 serving per week) processed foods, sweetened drinks, white  starches, red meat, fast foods and sweets and consisting of: * 5-9 servings per day of fresh or frozen fruits and vegetables (not corn or potatoes, not dried or canned) *nuts and seeds, beans *olives and olive oil *small portions of lean meats such as fish and white chicken  *small portions of whole grains 3)Get at least 150 minutes of sweaty aerobic exercise per week 4)reduce stress - counseling, meditation, relaxation to balance other aspects of your life         Kriste Basque R., DO

## 2016-02-09 NOTE — Progress Notes (Signed)
Pre visit review using our clinic review tool, if applicable. No additional management support is needed unless otherwise documented below in the visit note. 

## 2016-02-09 NOTE — Patient Instructions (Signed)
BEFORE YOU LEAVE: -follow up: 4 months -labs today if fasting; if not fasting schedule fasting labs in next few weeks  We have ordered labs or studies at this visit. It can take up to 1-2 weeks for results and processing. IF results require follow up or explanation, we will call you with instructions. Clinically stable results will be released to your [facility]. If you have not heard from us or cannot find your results in [facility] in 2 weeks please contact our office at [phone].  If you are not yet signed up for [facility], please consider signing up.  We recommend the following healthy lifestyle: 1) Small portions - eat off of salad plate instead of dinner plate 2) Eat a healthy clean diet with avoidance of (less then 1 serving per week) processed foods, sweetened drinks, white starches, red meat, fast foods and sweets and consisting of: * 5-9 servings per day of fresh or frozen fruits and vegetables (not corn or potatoes, not dried or canned) *nuts and seeds, beans *olives and olive oil *small portions of lean meats such as fish and white chicken  *small portions of whole grains 3)Get at least 150 minutes of sweaty aerobic exercise per week 4)reduce stress - counseling, meditation, relaxation to balance other aspects of your life

## 2016-02-10 ENCOUNTER — Encounter: Payer: Self-pay | Admitting: Gastroenterology

## 2016-02-11 MED ORDER — ATORVASTATIN CALCIUM 20 MG PO TABS: 20.0000 mg | ORAL_TABLET | Freq: Every day | ORAL | 1 refills | Status: DC

## 2016-02-11 NOTE — Addendum Note (Signed)
Addended by: Johnella MoloneyFUNDERBURK, JO A on: 02/11/2016 03:09 PM   Modules accepted: Orders

## 2016-03-18 ENCOUNTER — Ambulatory Visit: Payer: No Typology Code available for payment source | Admitting: Family Medicine

## 2016-04-05 DIAGNOSIS — I5022 Chronic systolic (congestive) heart failure: Secondary | ICD-10-CM | POA: Diagnosis not present

## 2016-04-05 DIAGNOSIS — I255 Ischemic cardiomyopathy: Secondary | ICD-10-CM | POA: Diagnosis not present

## 2016-04-05 DIAGNOSIS — I10 Essential (primary) hypertension: Secondary | ICD-10-CM | POA: Diagnosis not present

## 2016-04-05 DIAGNOSIS — I2510 Atherosclerotic heart disease of native coronary artery without angina pectoris: Secondary | ICD-10-CM | POA: Diagnosis not present

## 2016-04-05 DIAGNOSIS — E785 Hyperlipidemia, unspecified: Secondary | ICD-10-CM | POA: Diagnosis not present

## 2016-04-19 ENCOUNTER — Other Ambulatory Visit (INDEPENDENT_AMBULATORY_CARE_PROVIDER_SITE_OTHER): Payer: PRIVATE HEALTH INSURANCE

## 2016-04-19 ENCOUNTER — Encounter: Payer: Self-pay | Admitting: Gastroenterology

## 2016-04-19 ENCOUNTER — Ambulatory Visit (INDEPENDENT_AMBULATORY_CARE_PROVIDER_SITE_OTHER): Payer: PRIVATE HEALTH INSURANCE | Admitting: Gastroenterology

## 2016-04-19 ENCOUNTER — Encounter (INDEPENDENT_AMBULATORY_CARE_PROVIDER_SITE_OTHER): Payer: Self-pay

## 2016-04-19 VITALS — BP 108/68 | HR 86

## 2016-04-19 DIAGNOSIS — R932 Abnormal findings on diagnostic imaging of liver and biliary tract: Secondary | ICD-10-CM | POA: Diagnosis not present

## 2016-04-19 LAB — PROTIME-INR
INR: 1.3 ratio — ABNORMAL HIGH (ref 0.8–1.0)
Prothrombin Time: 13.4 s — ABNORMAL HIGH (ref 9.6–13.1)

## 2016-04-19 LAB — CBC WITH DIFFERENTIAL/PLATELET
BASOS ABS: 0 10*3/uL (ref 0.0–0.1)
BASOS PCT: 0.3 % (ref 0.0–3.0)
EOS PCT: 1.9 % (ref 0.0–5.0)
Eosinophils Absolute: 0.1 10*3/uL (ref 0.0–0.7)
HEMATOCRIT: 48.6 % — AB (ref 36.0–46.0)
Hemoglobin: 16.5 g/dL — ABNORMAL HIGH (ref 12.0–15.0)
LYMPHS PCT: 10.4 % — AB (ref 12.0–46.0)
Lymphs Abs: 0.8 10*3/uL (ref 0.7–4.0)
MCHC: 34 g/dL (ref 30.0–36.0)
MCV: 92.4 fl (ref 78.0–100.0)
MONOS PCT: 5.1 % (ref 3.0–12.0)
Monocytes Absolute: 0.4 10*3/uL (ref 0.1–1.0)
NEUTROS ABS: 6.1 10*3/uL (ref 1.4–7.7)
Neutrophils Relative %: 82.3 % — ABNORMAL HIGH (ref 43.0–77.0)
PLATELETS: 137 10*3/uL — AB (ref 150.0–400.0)
RBC: 5.26 Mil/uL — ABNORMAL HIGH (ref 3.87–5.11)
RDW: 16 % — ABNORMAL HIGH (ref 11.5–15.5)
WBC: 7.4 10*3/uL (ref 4.0–10.5)

## 2016-04-19 LAB — HEPATIC FUNCTION PANEL
ALT: 6 U/L (ref 0–35)
AST: 10 U/L (ref 0–37)
Albumin: 3.6 g/dL (ref 3.5–5.2)
Alkaline Phosphatase: 82 U/L (ref 39–117)
BILIRUBIN TOTAL: 1.4 mg/dL — AB (ref 0.2–1.2)
Bilirubin, Direct: 0.4 mg/dL — ABNORMAL HIGH (ref 0.0–0.3)
TOTAL PROTEIN: 6.3 g/dL (ref 6.0–8.3)

## 2016-04-19 LAB — BASIC METABOLIC PANEL
BUN: 22 mg/dL (ref 6–23)
CO2: 31 mEq/L (ref 19–32)
Calcium: 9.1 mg/dL (ref 8.4–10.5)
Chloride: 97 mEq/L (ref 96–112)
Creatinine, Ser: 1.11 mg/dL (ref 0.40–1.20)
GFR: 55.47 mL/min — AB (ref >60.00)
GLUCOSE: 387 mg/dL — AB (ref 70–99)
POTASSIUM: 3.6 meq/L (ref 3.5–5.1)
SODIUM: 136 meq/L (ref 135–145)

## 2016-04-19 NOTE — Progress Notes (Signed)
HPI: This is a   very pleasant 50 year old woman   who was referred to me by Terressa KoyanagiKim, [name], DO  to evaluate  possible underlying chronic liver disease .    Chief complaint is possible cirrhosis  He was acutely ill in 2013 and stent several weeks in [facility]. Some of her tests while she was there suggested she may have underlying chronic liver disease, see that summarized below.  She has no local imaging tests from here in [place]. I was able to find some reports through care everywhere about CT scans and ultrasounds all done 4 years ago I believe when she was hospitalized in [place]. One CT scan March 2013 said that the liver was "unremarkable". An ultrasound done around the same time suggested "minimally nodular contour"  Labs 2013 also in care everywhere show hepatitis C antibody negative, hepatitis B surface antibody negative, hepatitis B surface antigen negative, hepatitis a IgM antibody negative.  Labs August 2017 show total bilirubin 1.4, direct bilirubin 0.4. The rest of her liver tests were normal. Her platelets were found to be 134,000.  Most recent INR was over a year ago and was normal.  LVEF 35% per this month cardiology office note from [facility].  She has never been specifically told she has cirrhosis. Liver disease does not run in her family. She has never been a day alcohol drinker.  Review of systems: Pertinent positive and negative review of systems were noted in the above HPI section. Complete review of systems was performed and was otherwise normal.   Past Medical History:  Diagnosis Date  . CAD (coronary artery disease) 12/12/2014   -s/p 2V PCI of the LAD/RCA with taxus stents -cardiologist is Dr. Carmon SailsKwawaja, Celedonio SavageUsman   . CHF (congestive heart failure) (HCC)    sees Dr. Wynonia HazardKhawaja  . Chronic kidney disease   . Depression   . Diabetes mellitus with renal complications (HCC)    PVD and retinopathy, S/p bilat ambutations  . Glaucoma   . Liver cirrhosis  (HCC)   . Lyme disease   . Retinal detachment    sees Dr. Johna SheriffPeter Rogaski per her report  . 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 LASIK    . LEG AMPUTATION BELOW KNEE  09/09/2011, 09/11/2011  . TUBAL LIGATION      Current Outpatient Prescriptions  Medication Sig Dispense Refill  . Albiglutide (TANZEUM) 30 MG PEN Inject 30 mg weekly 4 each 1  . aspirin 325 MG tablet Take 325 mg by mouth at bedtime.     Marland Kitchen. atorvastatin (LIPITOR) 20 MG tablet Take 1 tablet (20 mg total) by mouth daily. 90 tablet 1  . cefpodoxime (VANTIN) 200 MG tablet Take 1 tablet (200 mg total) by mouth 2 (two) times daily. 12 tablet 0  . clopidogrel (PLAVIX) 75 MG tablet Take 75 mg by mouth.    . cyclobenzaprine (FLEXERIL) 10 MG tablet Take 10 mg by mouth 3 (three) times daily as needed for muscle spasms.     . hydrALAZINE (APRESOLINE) 10 MG tablet Take 1 tablet (10 mg total) by mouth 3 (three) times daily. 270 tablet 3  . ibuprofen (ADVIL,MOTRIN) 200 MG tablet Take 200 mg by mouth every 6 (six) hours as needed.    . Insulin Glargine (LANTUS SOLOSTAR) 100 UNIT/ML Solostar Pen Inject 40-55 Units into the skin 2 (two) times daily. 55 units every morning and 40 units every  evening. 30 mL 2  . insulin lispro (HUMALOG) 100 UNIT/ML KiwkPen Inject 25 units with breakfast and lunch-35 units with dinner 30 mL 0  . isosorbide mononitrate (IMDUR) 30 MG 24 hr tablet Take 30 mg by mouth 3 (three) times daily.     . metolazone (ZAROXOLYN) 5 MG tablet Take 1 tablet (5 mg total) by mouth daily. 30 minutes before Demadex dose 90 tablet 3  . ondansetron (ZOFRAN) 4 MG tablet Take 1 tablet (4 mg total) by mouth every 8 (eight) hours as needed for nausea or vomiting. 20 tablet 0  . spironolactone (ALDACTONE) 25 MG tablet Take 1 tablet (25 mg total) by mouth daily. 90 tablet 3  . torsemide (DEMADEX) 20 MG tablet Take 1 tablet (20 mg total) by mouth 2 (two) times daily. 180  tablet 3  . lisinopril (PRINIVIL,ZESTRIL) 10 MG tablet Take 10 mg by mouth at bedtime.      No current facility-administered medications for this visit.     Allergies as of 04/19/2016 - Review Complete 04/19/2016  Allergen Reaction Noted  . Broccoli [brassica oleracea italica] Anaphylaxis 11/28/2014  . Influenza vaccines Anaphylaxis 04/01/2015  . Pneumococcal vaccines Anaphylaxis 04/01/2015  . Trulicity [dulaglutide] Hives and Nausea And Vomiting 02/21/2015  . Iron Other (See Comments) 11/28/2014  . Latex Other (See Comments) 11/28/2014  . Lisinopril Nausea Only 02/21/2015  . Penicillins Other (See Comments) 11/28/2014  . Tape Other (See Comments) 02/21/2015  . Codeine Nausea And Vomiting and Rash 11/28/2014  . Sulfa antibiotics Nausea And Vomiting and Rash 11/28/2014  . Sulfur Nausea And Vomiting and Rash 02/21/2015    Family History  Problem Relation Age of Onset  . Arthritis Mother   . Heart disease Mother   . Mental illness Mother   . Diabetes Mother   . Arthritis Maternal Grandmother   . Diabetes Maternal Grandmother   . Arthritis Father   . CVA Father   . Hypertension Father   . Sudden death Paternal Grandfather     Social History   Social History  . Marital status: Married    Spouse name: N/A  . Number of children: N/A  . Years of education: N/A   Occupational History  . Not on file.   Social History Main Topics  . Smoking status: Never Smoker  . Smokeless tobacco: Not on file  . Alcohol use No  . Drug use: No  . Sexual activity: Not on file   Other Topics Concern  . Not on file   Social History Narrative   Married   Husband is a Naval architect        Physical Exam: There were no vitals taken for this visit. Constitutional: She is obese, she sits in a mechanical wheelchair Psychiatric: alert and oriented x3 Eyes: extraocular movements intact Mouth: oral pharynx moist, no lesions Neck: supple no lymphadenopathy Cardiovascular: heart regular  rate and rhythm Lungs: clear to auscultation bilaterally Abdomen: soft, nontender, nondistended, no obvious ascites, no peritoneal signs, normal bowel sounds Extremities: Status post bilateral BKA Skin: no lesions on visible extremities   Assessment and plan: 50 y.o. female with  multiple medical concerns and lingering concern of possible underlying cirrhosis  Imaging dating back to 2013 does not conclusively show she has cirrhosis. I think that needs to be updated and I will order an ultrasound of the liver with elastography to see if she has significant fibrosis or cirrhosis of the liver. She will likewise have a basic set of blood tests including  a CBC, complete metabolic profile and coag studies.  If she is proven to have significant chronic liver disease then she will probably need further testing, workup to determine the etiology and the extent including examination to check for portal hypertension. If the test above argue against significant chronic liver disease then she is not necessary need further GI, liver follow-up.   Rob Bunting, MD [facility] 04/19/2016, 10:43 AM  Cc: Terressa Koyanagi, DO

## 2016-04-19 NOTE — Patient Instructions (Addendum)
US of liver with elastography to check for signs of cirrhosis.  You have been scheduled for an abdominal ultrasound at [facility] (1st floor of hospital) on 04/26/16 at 9 am. Please arrive 15 minutes prior to your appointment for registration. Make certain not to have anything to eat or drink 8 hours prior to your appointment. Should you need to reschedule your appointment, please contact radiology at [phone]. This test typically takes about 30 minutes to perform.   You will have labs checked today in the basement lab.  Please head down after you check out with the front desk  (cbc, bmet, LFTs, INR). Pending those results, will determine if any GI/liver follow up is needed.

## 2016-04-25 ENCOUNTER — Telehealth: Payer: Self-pay | Admitting: Endocrinology

## 2016-04-25 ENCOUNTER — Other Ambulatory Visit: Payer: Self-pay

## 2016-04-25 MED ORDER — INSULIN LISPRO 100 UNIT/ML (KWIKPEN): PEN_INJECTOR | SUBCUTANEOUS | 0 refills | Status: DC

## 2016-04-25 NOTE — Telephone Encounter (Signed)
Ordered Humalog 04/25/16

## 2016-04-25 NOTE — Telephone Encounter (Signed)
Pt needs enough Humulog called into [facility] until she can come into see Dr. Lucianne MussKumar on 05/02/16

## 2016-04-26 ENCOUNTER — Ambulatory Visit (HOSPITAL_COMMUNITY): Payer: No Typology Code available for payment source

## 2016-05-02 ENCOUNTER — Ambulatory Visit (INDEPENDENT_AMBULATORY_CARE_PROVIDER_SITE_OTHER): Payer: PRIVATE HEALTH INSURANCE | Admitting: Endocrinology

## 2016-05-02 ENCOUNTER — Other Ambulatory Visit: Payer: Self-pay

## 2016-05-02 ENCOUNTER — Encounter: Payer: Self-pay | Admitting: Endocrinology

## 2016-05-02 VITALS — BP 128/82 | HR 80 | Ht <= 58 in

## 2016-05-02 DIAGNOSIS — Z89612 Acquired absence of left leg above knee: Secondary | ICD-10-CM

## 2016-05-02 DIAGNOSIS — I429 Cardiomyopathy, unspecified: Secondary | ICD-10-CM

## 2016-05-02 DIAGNOSIS — Z89611 Acquired absence of right leg above knee: Secondary | ICD-10-CM

## 2016-05-02 DIAGNOSIS — Z794 Long term (current) use of insulin: Secondary | ICD-10-CM | POA: Diagnosis not present

## 2016-05-02 DIAGNOSIS — E1165 Type 2 diabetes mellitus with hyperglycemia: Secondary | ICD-10-CM

## 2016-05-02 DIAGNOSIS — Z89512 Acquired absence of left leg below knee: Secondary | ICD-10-CM | POA: Diagnosis not present

## 2016-05-02 DIAGNOSIS — Z89511 Acquired absence of right leg below knee: Secondary | ICD-10-CM

## 2016-05-02 DIAGNOSIS — I2510 Atherosclerotic heart disease of native coronary artery without angina pectoris: Secondary | ICD-10-CM

## 2016-05-02 LAB — POCT GLYCOSYLATED HEMOGLOBIN (HGB A1C): Hemoglobin A1C: 10.1

## 2016-05-02 MED ORDER — GLUCOSE BLOOD VI STRP: ORAL_STRIP | 3 refills | Status: DC

## 2016-05-02 MED ORDER — METFORMIN HCL ER 500 MG PO TB24: 2000.0000 mg | ORAL_TABLET | Freq: Every day | ORAL | 3 refills | Status: DC

## 2016-05-02 MED ORDER — PRODIGY AUTOCODE BLOOD GLUCOSE W/DEVICE KIT: 1.0000 | PACK | Freq: Every day | 0 refills | Status: DC

## 2016-05-02 MED ORDER — INSULIN GLARGINE 100 UNIT/ML SOLOSTAR PEN: 40.0000 [IU] | PEN_INJECTOR | Freq: Two times a day (BID) | SUBCUTANEOUS | 2 refills | Status: DC

## 2016-05-02 MED ORDER — INSULIN LISPRO 100 UNIT/ML (KWIKPEN): PEN_INJECTOR | SUBCUTANEOUS | 0 refills | Status: DC

## 2016-05-02 NOTE — Progress Notes (Signed)
Patient ID: Sue Martin, female   DOB: 01-Jan-1966, 50 y.o.   MRN: [id number]           Reason for Appointment: Follow-up for Type 2 Diabetes  Referring physician: None  History of Present Illness:          Date of diagnosis of type 2 diabetes mellitus: ?  2007        Recent history:  INSULIN regimen is described as: 55-45 units of Lantus bid; Humalog 35 units with meals     Since she had GI side effects with Trulicity she was told to start Tanzeum on her last visit but this was denied by her insurance   A1c is still significantly high at 10.1, previously 10.4  Current blood sugar patterns and problems identified:  She has persistently poor control of her diabetes and has not been seen since June  Although she is trying to take her insulin doses as prescribed A1c is not better  She has A record of her blood sugars in a scrapbook but difficult to sort out the times when she checks them  She can only use Prodigy meter because of her visual difficulties, also apparently paying for this out of pocket  She had a blood sugar 387 in the lab after breakfast when she saw her gastroenterologist but has only a couple of readings over 200 at home.  Her monitor is about 50 years old  Difficult to check her weight because of bilateral amputation  Her blood sugar is mostly on 140-160 at various times recently and today in the office is 175 with only a snack for lunch  Appears not to be checking blood sugars after her evening meal  She does not adjust her Humalog based on what she is eating and is taking the same dose with all her meals.  She says that mostly her evening meal is larger but sometimes it is lunch  Side effects with medications: vomiting with Trulicity  Compliance with the medical regimen: Fair Hypoglycemia: None  Glucose monitoring:  done 3 times a day         Glucometer: Prodigy.       Blood Glucose readings   Mean values apply above for all meters except median for  One Touch  PRE-MEAL Fasting Lunch Dinner Bedtime Overall  Glucose range: 138-178  131-286  147-201  ?     Mean/median:         Self-care: The diet that the patient has been following is: None      Meal times: Breakfast: 10 am Lunch: 3-4 pm, Dinner 8 pm  Typical meal intake: Breakfast is usually a granola bar or light yogurt, lunch may be sandwich or just light snack and soups, dinner is usually yogurt or other snacks like fruit or sugar-free cookies               Dietician visit, most recent: none, only in the hospital               Exercise:  unable to do any  Weight history: Around the time of diagnosis her weight was 350  Wt Readings from Last 3 Encounters:  11/17/15 226 lb 15.8 oz (103 kg)  02/20/15 226 lb 15.8 oz (103 kg)    Glycemic control:   Lab Results  Component Value Date   HGBA1C 10.1 05/02/2016   HGBA1C 10.4 12/11/2015   HGBA1C 9.3 07/14/2015   Lab Results  Component Value Date   MICROALBUR  17.8 (H) 12/22/2014   LDLCALC 125 (H) 02/09/2016   CREATININE 1.11 04/19/2016         Medication List       Accurate as of 05/02/16  8:42 PM. Always use your most recent med list.          aspirin 325 MG tablet Take 325 mg by mouth at bedtime.   atorvastatin 20 MG tablet Commonly known as:  LIPITOR Take 1 tablet (20 mg total) by mouth daily.   cefpodoxime 200 MG tablet Commonly known as:  VANTIN Take 1 tablet (200 mg total) by mouth 2 (two) times daily.   cyclobenzaprine 10 MG tablet Commonly known as:  FLEXERIL Take 10 mg by mouth 3 (three) times daily as needed for muscle spasms.   glucose blood test strip Use to test blood sugar three times daily   hydrALAZINE 10 MG tablet Commonly known as:  APRESOLINE Take 1 tablet (10 mg total) by mouth 3 (three) times daily.   ibuprofen 200 MG tablet Commonly known as:  ADVIL,MOTRIN Take 200 mg by mouth every 6 (six) hours as needed.   Insulin Glargine 100 UNIT/ML Solostar Pen Commonly known as:  LANTUS  SOLOSTAR Inject 40-55 Units into the skin 2 (two) times daily. 55 units every morning and 45 units every evening.   insulin lispro 100 UNIT/ML KiwkPen Commonly known as:  HUMALOG Inject 35 units with breakfast - 35 lunch-35 units with dinner   isosorbide mononitrate 30 MG 24 hr tablet Commonly known as:  IMDUR Take 30 mg by mouth 3 (three) times daily.   lisinopril 10 MG tablet Commonly known as:  PRINIVIL,ZESTRIL Take 10 mg by mouth at bedtime.   metFORMIN 500 MG 24 hr tablet Commonly known as:  GLUCOPHAGE-XR Take 4 tablets (2,000 mg total) by mouth daily with supper.   metolazone 5 MG tablet Commonly known as:  ZAROXOLYN Take 1 tablet (5 mg total) by mouth daily. 30 minutes before Demadex dose   ondansetron 4 MG tablet Commonly known as:  ZOFRAN Take 1 tablet (4 mg total) by mouth every 8 (eight) hours as needed for nausea or vomiting.   PLAVIX 75 MG tablet Generic drug:  clopidogrel Take 75 mg by mouth.   Prodigy Autocode Blood Glucose w/Device Kit 1 each by Does not apply route daily.   spironolactone 25 MG tablet Commonly known as:  ALDACTONE Take 1 tablet (25 mg total) by mouth daily.   torsemide 20 MG tablet Commonly known as:  DEMADEX Take 1 tablet (20 mg total) by mouth 2 (two) times daily.       Allergies:  Allergies  Allergen Reactions  . Broccoli [Brassica Oleracea Italica] Anaphylaxis  . Influenza Vaccines Anaphylaxis    Per patient  . Pneumococcal Vaccines Anaphylaxis    Per patient  . Trulicity [Dulaglutide] Hives and Nausea And Vomiting  . Iron Other (See Comments)    GI intolerance  . Latex Other (See Comments)    blisters  . Lisinopril Nausea Only  . Penicillins Other (See Comments)    GI intolerance, UTI, can tolerate in IV Tolerates cephalosporins  . Tape Other (See Comments)    Tears skin.  Please use "paper" tape  . Codeine Nausea And Vomiting and Rash  . Sulfa Antibiotics Nausea And Vomiting and Rash  . Sulfur Nausea And  Vomiting and Rash    Past Medical History:  Diagnosis Date  . CAD (coronary artery disease) 12/12/2014   -s/p 2V PCI of the LAD/RCA with  taxus stents -cardiologist is Dr. Carlis Abbott, Darcel Bayley   . CHF (congestive heart failure) ([place])    sees Dr. Novella Rob  . Chronic kidney disease   . Depression   . Diabetes mellitus with renal complications (HCC)    PVD and retinopathy, S/p bilat ambutations  . Glaucoma   . Liver cirrhosis ([place])   . Lyme disease   . Retinal detachment    sees Dr. Rose Phi per her report  . S/P bilateral BKA (below knee amputation) ([place]) 11/28/2014    Past Surgical History:  Procedure Laterality Date  . LASIK    . LEG AMPUTATION BELOW KNEE  09/09/2011, 09/11/2011  . TUBAL LIGATION      Family History  Problem Relation Age of Onset  . Arthritis Mother   . Heart disease Mother   . Mental illness Mother   . Diabetes Mother   . Arthritis Maternal Grandmother   . Diabetes Maternal Grandmother   . Arthritis Father   . CVA Father   . Hypertension Father   . Sudden death Paternal Grandfather     Social History:  reports that she has never smoked. She does not have any smokeless tobacco history on file. She reports that she does not drink alcohol or use drugs.    Review of Systems    Lipid history: On Lipitor for the last few years, taking 10 mg, Not clear if this is being followed by PCP or cardiologist   Lab Results  Component Value Date   CHOL 188 02/09/2016   HDL 36.40 (L) 02/09/2016   LDLCALC 125 (H) 02/09/2016   TRIG 135.0 02/09/2016   CHOLHDL 5 02/09/2016           Eyes:  history of significant Visual loss  since about 2005.   Hypertension: None, on medications for CHF mostly  CHF/cardiomyopathy her ejection fraction 35 and she is on both Demadex and Zaroxolyn She thinks she is having more swelling recently and is going to see the nephrologist soon  KIDNEY function: Although this was significantly abnormal about a year ago and has been fairly  good for the last 4 months          MUSCULOSKELETAL:  she has bilateral below-knee amputations    Physical Examination:  BP 128/82   Pulse 80   Ht '4\' 7"'  (1.397 m)   SpO2 90%   Exam not indicated  ASSESSMENT:  Diabetes type 2, uncontrolled with obesity See history of present illness for detailed discussion of current diabetes management, blood sugar patterns and problems identified She has persistently poor control of her diabetes as judged by her A1c A1c around 10% again  Although she is checking her blood sugars at home they appear to be relatively good and not as high as expected for her A1c Her lab glucose was nearly 400 without any excessive carbohydrate that morning and not clear if her monitor is accurate She is still significantly obese and insulin resistant  Although she is a good candidate for a GLP-1 or SGLT 2 drug she has had nausea from GLP-1 drugs and not clear if she can take SGLT 2 drugs without adjusting her diuretics   PLAN:   Start metformin ER.  She will start with 500 mg and increase every 5 days by another tablet until maximally tolerated dose is reached or she can go up to 2000 mg  Increase Lantus in the morning and Humalog at her larger meals by 5 units  Detailed instructions written  Add protein to each meal  Consider consultation with dietitian again  She will organize her blood sugar diary by time of day, this was started for her  More blood sugars after evening meal/at bedtime  Need more close follow-up  We will send a copy of this note to her cardiologist and nephrologist to consider adding Jardiance 10 mg while reducing Demadex if needed, will need close follow-up of renal function and if this is done  Patient Instructions  Check blood sugars on waking up  3-4x per week  Also check blood sugars about 2 hours after a meal and do this after different meals by rotation  Recommended blood sugar levels on waking up is 90-130 and about 2  hours after meal is 130-160  Please bring your blood sugar monitor to each visit, thank you  Start taking Metformin 500 mg, 1 tablet with your main meal for 5 days. Occasionally this may initially cause loose stools or nausea. If  tolerating well after 5 days add a second Metformin tablet (500 mg) at the same time.   Continue adding another tablet after 5 days days if no persistent nausea or diarrhea until reaching the maximum tolerated dose or the full dose of 4 tablets once daily.    LANTUS 65 IN AM , STAY ON 45 IN PM  Humalog 35 units with meals  AND 40 WITH LARGEST MEAL OF THE DAY      Counseling time on subjects discussed above is over 50% of today's 25 minute visit   Sue Martin 05/02/2016, 8:42 PM   Note: This office note was prepared with Estate agent. Any transcriptional errors that result from this process are unintentional.

## 2016-05-02 NOTE — Patient Instructions (Addendum)
Check blood sugars on waking up  3-4x per week  Also check blood sugars about 2 hours after a meal and do this after different meals by rotation  Recommended blood sugar levels on waking up is 90-130 and about 2 hours after meal is 130-160  Please bring your blood sugar monitor to each visit, thank you  Start taking Metformin 500 mg, 1 tablet with your main meal for 5 days. Occasionally this may initially cause loose stools or nausea. If  tolerating well after 5 days add a second Metformin tablet (500 mg) at the same time.   Continue adding another tablet after 5 days days if no persistent nausea or diarrhea until reaching the maximum tolerated dose or the full dose of 4 tablets once daily.    LANTUS 65 IN AM , STAY ON 45 IN PM  Humalog 35 units with meals  AND 40 WITH LARGEST MEAL OF THE DAY

## 2016-05-03 DIAGNOSIS — I509 Heart failure, unspecified: Secondary | ICD-10-CM | POA: Diagnosis not present

## 2016-05-03 DIAGNOSIS — N183 Chronic kidney disease, stage 3 (moderate): Secondary | ICD-10-CM | POA: Diagnosis not present

## 2016-05-05 ENCOUNTER — Ambulatory Visit (HOSPITAL_COMMUNITY)
Admission: RE | Admit: 2016-05-05 | Discharge: 2016-05-05 | Disposition: A | Discharge status: Home / Self Care | Payer: Medicare Other | Source: Ambulatory Visit | Attending: Gastroenterology | Admitting: Gastroenterology

## 2016-05-05 DIAGNOSIS — R161 Splenomegaly, not elsewhere classified: Secondary | ICD-10-CM | POA: Insufficient documentation

## 2016-05-05 DIAGNOSIS — R932 Abnormal findings on diagnostic imaging of liver and biliary tract: Secondary | ICD-10-CM | POA: Insufficient documentation

## 2016-05-05 DIAGNOSIS — K8020 Calculus of gallbladder without cholecystitis without obstruction: Secondary | ICD-10-CM | POA: Diagnosis not present

## 2016-05-10 ENCOUNTER — Ambulatory Visit: Payer: No Typology Code available for payment source
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
## 2016-05-12 ENCOUNTER — Ambulatory Visit: Payer: No Typology Code available for payment source

## 2016-05-16 ENCOUNTER — Telehealth: Payer: Self-pay | Admitting: Gastroenterology

## 2016-05-16 NOTE — Telephone Encounter (Signed)
See results note. 

## 2016-05-26 ENCOUNTER — Telehealth: Payer: Self-pay

## 2016-05-26 ENCOUNTER — Other Ambulatory Visit: Payer: Self-pay

## 2016-05-26 NOTE — Telephone Encounter (Signed)
Contacted [employer] PA number to get prodigy test strips approved- got approval for 1 year- called pharmacy to notify them of the new prescription

## 2016-05-30 ENCOUNTER — Ambulatory Visit (INDEPENDENT_AMBULATORY_CARE_PROVIDER_SITE_OTHER): Payer: PRIVATE HEALTH INSURANCE | Admitting: Endocrinology

## 2016-05-30 VITALS — BP 128/88 | HR 72 | Temp 97.9°F

## 2016-05-30 DIAGNOSIS — Z794 Long term (current) use of insulin: Secondary | ICD-10-CM

## 2016-05-30 DIAGNOSIS — E1165 Type 2 diabetes mellitus with hyperglycemia: Secondary | ICD-10-CM

## 2016-05-30 DIAGNOSIS — I2510 Atherosclerotic heart disease of native coronary artery without angina pectoris: Secondary | ICD-10-CM | POA: Diagnosis not present

## 2016-05-30 LAB — BASIC METABOLIC PANEL
BUN: 26 mg/dL — ABNORMAL HIGH (ref 6–23)
CHLORIDE: 93 meq/L — AB (ref 96–112)
CO2: 39 meq/L — AB (ref 19–32)
CREATININE: 1.14 mg/dL (ref 0.40–1.20)
Calcium: 9.2 mg/dL (ref 8.4–10.5)
GFR: 53.77 mL/min — ABNORMAL LOW (ref >60.00)
Glucose, Bld: 82 mg/dL (ref 70–99)
Potassium: 2.9 mEq/L — ABNORMAL LOW (ref 3.5–5.1)
Sodium: 139 mEq/L (ref 135–145)

## 2016-05-30 LAB — GLUCOSE, POCT (MANUAL RESULT ENTRY): POC GLUCOSE: 50 mg/dL — AB (ref 70–99)

## 2016-05-30 MED ORDER — ACCU-CHEK AVIVA PLUS W/DEVICE KIT: PACK | 0 refills | Status: DC

## 2016-05-30 MED ORDER — GLUCOSE BLOOD VI STRP: ORAL_STRIP | 12 refills | Status: DC

## 2016-05-30 NOTE — Patient Instructions (Addendum)
Metformin ER 1 pill twice daily  After 1 week add 3rd pill if tolerated  Lantus 40 in pm not 45.  Marland Kitchen. Check blood sugars on waking up  4x daily  Also check blood sugars about 2-3 hours after a meal and do this after different meals by rotation  Recommended blood sugar levels on waking up is 90-130 and about 2 hours after meal is 130-160  Please bring your blood sugar monitor to each visit, thank you

## 2016-05-30 NOTE — Progress Notes (Signed)
Patient ID: Sue Martin, female   DOB: Mar 05, 1966, 50 y.o.   MRN: [id number]           Reason for Appointment: Follow-up for Type 2 Diabetes   History of Present Illness:          Date of diagnosis of type 2 diabetes mellitus: ?  2007        Recent history:  INSULIN regimen is described as: 65-45 units of Lantus bid; Humalog 35 units with meals     Since she had GI side effects with Trulicity she was told to start Tanzeum but this was denied by her insurance   A1c in 11/17 is still significantly high at 10.1, previously 10.4  Current blood sugar patterns and problems identified:  She has persistently poor control of her diabetes   She was given a trial of metformin ER on her last visit about a month ago and was told to titrate the medication slowly every 5 days.  She says she was doing fine with 2 tablets but with 3 and 4 tablet she started having nausea and diarrhea.  However she was increasing the dose every 3 days and not stopping when she was starting to have side effects.  She also tried to take 2 tablets before eating dinner and 2 tablets half an hour later and this continued to cause GI side effects with metformin and she stopped this a couple of weeks ago  She also ran out of test strips about 2-3 weeks ago and apparently cannot get a Prodigy meter  Glucose today in the office is 50 without any insulin and she thinks that she was feeling weak.  This happens only occasionally,  INSULIN: She was told to take 60 units in the morning of Lantus but she is taking 65  She thinks her blood sugars were relatively better in the morning when she was testing and slightly higher at suppertime, however despite reminders did not check readings after evening meal   Side effects with medications: vomiting with Trulicity  Compliance with the medical regimen: Fair  Hypoglycemia: None  Glucose monitoring:  done 3 times a day         Glucometer: Prodigy.       Blood Glucose readings  by recall:  Mean values apply above for all meters except median for One Touch  PRE-MEAL Fasting Lunch Dinner Bedtime Overall  Glucose range: 135  160    Mean/median:        PREVIOUS readings:  Mean values apply above for all meters except median for One Touch  PRE-MEAL Fasting Lunch Dinner Bedtime Overall  Glucose range: 138-178  131-286  147-201  ?     Mean/median:         Self-care:  Meal times: Breakfast: 10 am Lunch: 3-4 pm, Dinner 8 pm  Typical meal intake: Breakfast is usually a granola bar or light yogurt, lunch may be sandwich or just light snack and soups, dinner is usually yogurt or other snacks like fruit or sugar-free cookies               Dietician visit, most recent: none, only in the hospital               Exercise:  unable to do any  Weight history: Around the time of diagnosis her weight was 350  Wt Readings from Last 3 Encounters:  11/17/15 226 lb 15.8 oz (103 kg)  02/20/15 226 lb 15.8 oz (103 kg)  Glycemic control:   Lab Results  Component Value Date   HGBA1C 10.1 05/02/2016   HGBA1C 10.4 12/11/2015   HGBA1C 9.3 07/14/2015   Lab Results  Component Value Date   MICROALBUR 17.8 (H) 12/22/2014   LDLCALC 125 (H) 02/09/2016   CREATININE 1.14 05/30/2016    Other active problems: See review of systems     Medication List       Accurate as of 05/30/16  1:50 PM. Always use your most recent med list.          aspirin 325 MG tablet Take 325 mg by mouth at bedtime.   atorvastatin 20 MG tablet Commonly known as:  LIPITOR Take 1 tablet (20 mg total) by mouth daily.   cefpodoxime 200 MG tablet Commonly known as:  VANTIN Take 1 tablet (200 mg total) by mouth 2 (two) times daily.   cyclobenzaprine 10 MG tablet Commonly known as:  FLEXERIL Take 10 mg by mouth 3 (three) times daily as needed for muscle spasms.   hydrALAZINE 10 MG tablet Commonly known as:  APRESOLINE Take 1 tablet (10 mg total) by mouth 3 (three) times daily.   ibuprofen  200 MG tablet Commonly known as:  ADVIL,MOTRIN Take 200 mg by mouth every 6 (six) hours as needed.   Insulin Glargine 100 UNIT/ML Solostar Pen Commonly known as:  LANTUS SOLOSTAR Inject 40-55 Units into the skin 2 (two) times daily. 55 units every morning and 45 units every evening.   insulin lispro 100 UNIT/ML KiwkPen Commonly known as:  HUMALOG Inject 35 units with breakfast - 35 lunch-35 units with dinner   isosorbide mononitrate 30 MG 24 hr tablet Commonly known as:  IMDUR Take 30 mg by mouth 3 (three) times daily.   lisinopril 10 MG tablet Commonly known as:  PRINIVIL,ZESTRIL Take 10 mg by mouth at bedtime.   metFORMIN 500 MG 24 hr tablet Commonly known as:  GLUCOPHAGE-XR Take 4 tablets (2,000 mg total) by mouth daily with supper.   metolazone 5 MG tablet Commonly known as:  ZAROXOLYN Take 1 tablet (5 mg total) by mouth daily. 30 minutes before Demadex dose   ondansetron 4 MG tablet Commonly known as:  ZOFRAN Take 1 tablet (4 mg total) by mouth every 8 (eight) hours as needed for nausea or vomiting.   PLAVIX 75 MG tablet Generic drug:  clopidogrel Take 75 mg by mouth.   spironolactone 25 MG tablet Commonly known as:  ALDACTONE Take 1 tablet (25 mg total) by mouth daily.   torsemide 20 MG tablet Commonly known as:  DEMADEX Take 1 tablet (20 mg total) by mouth 2 (two) times daily.       Allergies:  Allergies  Allergen Reactions  . Broccoli [Brassica Oleracea Italica] Anaphylaxis  . Influenza Vaccines Anaphylaxis    Per patient  . Pneumococcal Vaccines Anaphylaxis    Per patient  . Trulicity [Dulaglutide] Hives and Nausea And Vomiting  . Iron Other (See Comments)    GI intolerance  . Latex Other (See Comments)    blisters  . Lisinopril Nausea Only  . Penicillins Other (See Comments)    GI intolerance, UTI, can tolerate in IV Tolerates cephalosporins  . Tape Other (See Comments)    Tears skin.  Please use "paper" tape  . Codeine Nausea And Vomiting  and Rash  . Sulfa Antibiotics Nausea And Vomiting and Rash  . Sulfur Nausea And Vomiting and Rash    Past Medical History:  Diagnosis Date  . CAD (coronary  artery disease) 12/12/2014   -s/p 2V PCI of the LAD/RCA with taxus stents -cardiologist is Dr. Carmon Sails, [place]   . CHF (congestive heart failure) ([facility])    sees Dr. Wynonia Hazard  . Chronic kidney disease   . Depression   . Diabetes mellitus with renal complications ([facility])    PVD and retinopathy, S/p bilat ambutations  . Glaucoma   . Liver cirrhosis ([facility])   . Lyme disease   . Retinal detachment    sees Dr. Johna Sheriff per her report  . 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 LASIK    . LEG AMPUTATION BELOW KNEE  09/09/2011, 09/11/2011  . TUBAL LIGATION      Family History  Problem Relation Age of Onset  . Arthritis Mother   . Heart disease Mother   . Mental illness Mother   . Diabetes Mother   . Arthritis Maternal Grandmother   . Diabetes Maternal Grandmother   . Arthritis Father   . CVA Father   . Hypertension Father   . Sudden death Paternal Grandfather     Social History:  reports that she has never smoked. She does not have any smokeless tobacco history on file. She reports that she does not drink alcohol or use drugs.    Review of Systems    Lipid history: On Lipitor for the last few years, taking 10 mg, Not clear if this is being followed by PCP or cardiologist   Lab Results  Component Value Date   CHOL 188 02/09/2016   HDL 36.40 (L) 02/09/2016   LDLCALC 125 (H) 02/09/2016   TRIG 135.0 02/09/2016   CHOLHDL 5 02/09/2016           Eyes:  history of significant Visual loss  since about 2005.    CHF/cardiomyopathy : her ejection fraction was 35 and she is on both Demadex and Zaroxolyn She thinks she is having more swelling recently and is going to see the nephrologist soon  She has bilateral below-knee amputations    Physical  Examination:  BP 128/88 (BP Location: Left Arm, Patient Position: Sitting, Cuff Size: Normal)   Pulse 72   Temp 97.9 F (36.6 C) (Oral)     ASSESSMENT:  Diabetes type 2, uncontrolled with obesity See history of present illness for detailed discussion of current diabetes management, blood sugar patterns and problems identified She has persistently poor control of her diabetes as judged by her A1c  around 10%    She appears to had some better blood sugars by recall with starting metformin and glucose was low in the office today which is unusual. However she went off the metformin because she was having GI side effects She did not titrate the dose slowly and most likely cannot tolerate more than 1000 or 1500 mg a day She will however benefit from this long-term and have better control as well as less tendency to weight gain  She does not have a functioning meter and since she can get help from family members now she can be given an Accu-Chek meter instead of prodigy  PLAN:   Start metformin ER Again and try only 1000 mg a day in split doses first, if tolerated she can go up to 1500 mg after at least 1 week and take it with food consistently.  Reduce Lantus in the evening by 5 units for now  Start using Accu-Chek meter  Patient Instructions  Metformin ER 1 pill twice daily  After 1 week add 3rd pill if tolerated  Lantus 40 in pm not 45.  Marland Kitchen. Check blood sugars on waking up  4x daily  Also check blood sugars about 2-3 hours after a meal and do this after different meals by rotation  Recommended blood sugar levels on waking up is 90-130 and about 2 hours after meal is 130-160  Please bring your blood sugar monitor to each visit, thank you        Mont[facility] Memorial HospitalKUMAR,Cecia Egge 05/30/2016, 1:50 PM   Note: This office note was prepared with Dragon voice recognition system technology. Any transcriptional errors that result from this process are unintentional.

## 2016-05-31 LAB — FRUCTOSAMINE: FRUCTOSAMINE: 308 umol/L — AB (ref 0–285)

## 2016-05-31 NOTE — Progress Notes (Signed)
Please let patient know that the potassium level is very low, and she needs to talk to her cardiologist, meanwhile she needs to double up on her potassium pills

## 2016-06-02 ENCOUNTER — Other Ambulatory Visit: Payer: Self-pay

## 2016-06-02 MED ORDER — GLUCOSE BLOOD VI STRP: ORAL_STRIP | 12 refills | Status: DC

## 2016-06-03 ENCOUNTER — Other Ambulatory Visit: Payer: Self-pay

## 2016-06-03 ENCOUNTER — Other Ambulatory Visit: Payer: Self-pay | Admitting: Family Medicine

## 2016-06-03 MED ORDER — GLUCOSE BLOOD VI STRP: ORAL_STRIP | 12 refills | Status: DC

## 2016-06-07 ENCOUNTER — Other Ambulatory Visit: Payer: Self-pay

## 2016-06-07 MED ORDER — GLUCOSE BLOOD VI STRP: ORAL_STRIP | 5 refills | Status: DC

## 2016-06-10 ENCOUNTER — Ambulatory Visit: Payer: No Typology Code available for payment source | Admitting: Family Medicine

## 2016-06-13 ENCOUNTER — Ambulatory Visit (INDEPENDENT_AMBULATORY_CARE_PROVIDER_SITE_OTHER): Payer: PRIVATE HEALTH INSURANCE | Admitting: Family Medicine

## 2016-06-13 ENCOUNTER — Encounter: Payer: Self-pay | Admitting: Family Medicine

## 2016-06-13 VITALS — BP 108/78 | HR 77 | Temp 98.2°F | Ht <= 58 in

## 2016-06-13 DIAGNOSIS — K7460 Unspecified cirrhosis of liver: Secondary | ICD-10-CM | POA: Diagnosis not present

## 2016-06-13 DIAGNOSIS — I5022 Chronic systolic (congestive) heart failure: Secondary | ICD-10-CM

## 2016-06-13 DIAGNOSIS — Z89511 Acquired absence of right leg below knee: Secondary | ICD-10-CM | POA: Diagnosis not present

## 2016-06-13 DIAGNOSIS — N183 Chronic kidney disease, stage 3 unspecified: Secondary | ICD-10-CM

## 2016-06-13 DIAGNOSIS — I255 Ischemic cardiomyopathy: Secondary | ICD-10-CM

## 2016-06-13 DIAGNOSIS — I739 Peripheral vascular disease, unspecified: Secondary | ICD-10-CM

## 2016-06-13 DIAGNOSIS — Z794 Long term (current) use of insulin: Secondary | ICD-10-CM | POA: Diagnosis not present

## 2016-06-13 DIAGNOSIS — Z89512 Acquired absence of left leg below knee: Secondary | ICD-10-CM | POA: Diagnosis not present

## 2016-06-13 DIAGNOSIS — J0100 Acute maxillary sinusitis, unspecified: Secondary | ICD-10-CM | POA: Diagnosis not present

## 2016-06-13 DIAGNOSIS — E11319 Type 2 diabetes mellitus with unspecified diabetic retinopathy without macular edema: Secondary | ICD-10-CM

## 2016-06-13 DIAGNOSIS — E1121 Type 2 diabetes mellitus with diabetic nephropathy: Secondary | ICD-10-CM | POA: Diagnosis not present

## 2016-06-13 DIAGNOSIS — E1165 Type 2 diabetes mellitus with hyperglycemia: Secondary | ICD-10-CM

## 2016-06-13 DIAGNOSIS — E785 Hyperlipidemia, unspecified: Secondary | ICD-10-CM

## 2016-06-13 MED ORDER — DOXYCYCLINE HYCLATE 100 MG PO CAPS: 100.0000 mg | ORAL_CAPSULE | Freq: Two times a day (BID) | ORAL | 0 refills | Status: DC

## 2016-06-13 NOTE — Progress Notes (Signed)
HPI:  Sue Martin is a pleasant 50 yo with a very complicated PMH significant for CsCHF, ischemic cardiomyopathy, CAD, PVD (managed by her cardiologist), DM with neprhopathy and retinopathy and hx amputation (now seeing an endocrinolgist), CKD, Hyperlipidemia, Cirrhosis and depression here for follow up. She is scooter bound. She saw her endocrinologist recently and they are making adjustments to her medications. She had a repeat liver US and cirrhosis was confirmed - it looks like the gastroenterologist had difficult contacting her with results and to request that she schedule follow up. She reports she did call them back, but always got an answering machine and finally she gave up. New problem of sinus congestion with thick white nasal discharge March, cough productive of thick mucus, occasional shortness of breath and sinus pain that started about 3 weeks ago. She does not feel this is improved. She has not had fevers, body aches, chest pain, diarrhea or hemoptysis. Due for pap smear. She has refused here and has agreed to schedule with gyn on multiple occassions. Due for eye exam. Refused vaccines.  ROS: See pertinent positives and negatives per HPI.  Past Medical History:  Diagnosis Date  . CAD (coronary artery disease) 12/12/2014   -s/p 2V PCI of the LAD/RCA with taxus stents -cardiologist is Dr. Carlis Abbott, [facility]   . CHF (congestive heart failure) ([place])    sees Dr. Novella Rob  . Chronic kidney disease   . Depression   . Diabetes mellitus with renal complications (H[facility])    PVD and retinopathy, S/p bilat ambutations  . Glaucoma   . Liver cirrhosis ([place])   . Lyme disease   . Retinal detachment    sees Dr. Rose Phi per her report  . S/P bilateral BKA (below knee amputation) ([place]) 11/28/2014    Past Surgical History:  Procedure Laterality Date  . LASIK    . LEG AMPUTATION BELOW KNEE  09/09/2011, 09/11/2011  . TUBAL LIGATION      Family History  Problem Relation Age of Onset  .  Arthritis Mother   . Heart disease Mother   . Mental illness Mother   . Diabetes Mother   . Arthritis Maternal Grandmother   . Diabetes Maternal Grandmother   . Arthritis Father   . CVA Father   . Hypertension Father   . Sudden death Paternal Grandfather     Social History   Social History  . Marital status: Married    Spouse name: N/A  . Number of children: N/A  . Years of education: N/A   Social History Main Topics  . Smoking status: Never Smoker  . Smokeless tobacco: None  . Alcohol use No  . Drug use: No  . Sexual activity: Not Asked   Other Topics Concern  . None   Social History Narrative   Married   Husband is a Administrator        Current Outpatient Prescriptions:  .  aspirin 325 MG tablet, Take 325 mg by mouth at bedtime. , Disp: , Rfl:  .  atorvastatin (LIPITOR) 20 MG tablet, Take 1 tablet (20 mg total) by mouth daily., Disp: 90 tablet, Rfl: 1 .  Blood Glucose Monitoring Suppl (ACCU-CHEK AVIVA PLUS) w/Device KIT, Use to check blood sugar 3 times a day, Disp: 1 kit, Rfl: 0 .  cefpodoxime (VANTIN) 200 MG tablet, Take 1 tablet (200 mg total) by mouth 2 (two) times daily., Disp: 12 tablet, Rfl: 0 .  clopidogrel (PLAVIX) 75 MG tablet, Take 75 mg by mouth., Disp: ,  Rfl:  .  cyclobenzaprine (FLEXERIL) 10 MG tablet, Take 10 mg by mouth 3 (three) times daily as needed for muscle spasms. , Disp: , Rfl:  .  glucose blood (ACCU-CHEK AVIVA) test strip, Use as instructed 3 times a day to check blood sugar. Dx code E11.39, Disp: 100 each, Rfl: 5 .  hydrALAZINE (APRESOLINE) 10 MG tablet, Take 1 tablet (10 mg total) by mouth 3 (three) times daily., Disp: 270 tablet, Rfl: 3 .  ibuprofen (ADVIL,MOTRIN) 200 MG tablet, Take 200 mg by mouth every 6 (six) hours as needed., Disp: , Rfl:  .  Insulin Glargine (LANTUS SOLOSTAR) 100 UNIT/ML Solostar Pen, Inject 40-55 Units into the skin 2 (two) times daily. 55 units every morning and 45 units every evening. (Patient taking differently:  Inject 40-55 Units into the skin 2 (two) times daily. 65 units every morning and 45 units every evening.), Disp: 15 mL, Rfl: 2 .  insulin lispro (HUMALOG) 100 UNIT/ML KiwkPen, Inject 35 units with breakfast - 35 lunch-35 units with dinner, Disp: 30 mL, Rfl: 0 .  isosorbide mononitrate (IMDUR) 30 MG 24 hr tablet, Take 30 mg by mouth 3 (three) times daily. , Disp: , Rfl:  .  metFORMIN (GLUCOPHAGE-XR) 500 MG 24 hr tablet, Take 4 tablets (2,000 mg total) by mouth daily with supper., Disp: 120 tablet, Rfl: 3 .  metolazone (ZAROXOLYN) 5 MG tablet, TAKE ONE TABLET BY MOUTH ONCE DAILY (30  MINUTES  BEFORE  DEMADEX  DOSE), Disp: 30 tablet, Rfl: 11 .  ondansetron (ZOFRAN) 4 MG tablet, Take 1 tablet (4 mg total) by mouth every 8 (eight) hours as needed for nausea or vomiting., Disp: 20 tablet, Rfl: 0 .  spironolactone (ALDACTONE) 25 MG tablet, Take 1 tablet (25 mg total) by mouth daily., Disp: 90 tablet, Rfl: 3 .  torsemide (DEMADEX) 20 MG tablet, Take 1 tablet (20 mg total) by mouth 2 (two) times daily., Disp: 180 tablet, Rfl: 3 .  doxycycline (VIBRAMYCIN) 100 MG capsule, Take 1 capsule (100 mg total) by mouth 2 (two) times daily., Disp: 20 capsule, Rfl: 0 .  lisinopril (PRINIVIL,ZESTRIL) 10 MG tablet, Take 10 mg by mouth at bedtime. , Disp: , Rfl:   EXAM:  Vitals:   06/13/16 1315  BP: 108/78  Pulse: 77  Temp: 98.2 F (36.8 C)    There is no height or weight on file to calculate BMI.  GENERAL: vitals reviewed and listed above, alert, oriented, appears well hydrated and in no acute distress, in scooter  HEENT: atraumatic, conjunttiva clear, no obvious abnormalities on inspection of external nose and ears, normal appearance of ear canals and TMs, thick nasal congestion, mild post oropharyngeal erythema with PND, no tonsillar edema or exudate, no sinus TTP  NECK: no obvious masses on inspection  LUNGS: clear to auscultation bilaterally, no wheezes, rales or rhonchi, good air movement  CV:  HRRR  MS: in scooter  PSYCH: pleasant and cooperative, no obvious depression or anxiety  ASSESSMENT AND PLAN:  Discussed the following assessment and plan:  Acute non-recurrent maxillary sinusitis - Plan: doxycycline (VIBRAMYCIN) 100 MG capsule -likely sinusitis, will tx with doxy -advised follow up if any worsening or symptoms persist  Cirrhosis of liver without ascites, unspecified hepatic cirrhosis type ([place]) -advise of abnormal [place] results and advised needs f/u with GI - she reports now that she knows she needs an appointment she will schedule Cardiomyopathy, ischemic  Type 2 diabetes mellitus with diabetic nephropathy, with long-term current use of insulin ([place]) Uncontrolled  type 2 diabetes mellitus with retinopathy, with long-term current use of insulin, macular edema presence unspecified, unspecified laterality, unspecified retinopathy severity ([place]) -sees endocrinologist  CKD (chronic kidney disease) stage 3, GFR 30-59 ml/min -stable  Hyperlipidemia, unspecified hyperlipidemia type PVD (peripheral vascular disease) ([place]) Chronic systolic congestive heart failure ([place]) S/P bilateral BKA (below knee amputation) ([place]) -managed by her cardiologist  Diabetic retinopathy associated with type 2 diabetes mellitus, macular edema presence unspecified, unspecified laterality, unspecified retinopathy severity ([place]) -sees endocrinologist -advised that she schedule a diabetic eye exam  -Patient advised to return or notify a doctor immediately if symptoms worsen or persist or new concerns arise.  Patient Instructions  BEFORE YOU LEAVE: -follow up: 3 months  Take the antibiotic as instructed and follow up if your sinus symptoms do not improve.  Call your gastroenterologist today and schedule a follow up visit for your liver issues.  Please call your gynecologist and schedule an annual women's health exam with pap smear.  Call opthalmology to schedule your diabetic eye exam.       Lucretia Kern., DO

## 2016-06-13 NOTE — Progress Notes (Signed)
Pre visit review using our clinic review tool, if applicable. No additional management support is needed unless otherwise documented below in the visit note. 

## 2016-06-13 NOTE — Patient Instructions (Addendum)
BEFORE YOU LEAVE: -follow up: 3 months  Take the antibiotic as instructed and follow up if your sinus symptoms do not improve.  Call your gastroenterologist today and schedule a follow up visit for your liver issues.  Please call your gynecologist and schedule an annual women's health exam with pap smear.  Call opthalmology to schedule your diabetic eye exam.

## 2016-06-15 ENCOUNTER — Telehealth: Payer: Self-pay | Admitting: Endocrinology

## 2016-06-15 NOTE — Telephone Encounter (Signed)
Pt needs refills on lantus solostar and call it into [facility] please  Pt also needs dx code for the new meter and test strips that we called in [facility]

## 2016-06-21 MED ORDER — INSULIN GLARGINE 100 UNIT/ML SOLOSTAR PEN: PEN_INJECTOR | SUBCUTANEOUS | 2 refills | Status: DC

## 2016-06-21 MED ORDER — ACCU-CHEK AVIVA PLUS W/DEVICE KIT: PACK | 0 refills | Status: DC

## 2016-06-21 MED ORDER — GLUCOSE BLOOD VI STRP: ORAL_STRIP | 5 refills | Status: DC

## 2016-06-21 NOTE — Telephone Encounter (Signed)
Refills submitted.  

## 2016-06-30 ENCOUNTER — Other Ambulatory Visit: Payer: Self-pay | Admitting: *Deleted

## 2016-06-30 ENCOUNTER — Encounter: Payer: Self-pay | Admitting: Physician Assistant

## 2016-06-30 ENCOUNTER — Ambulatory Visit (INDEPENDENT_AMBULATORY_CARE_PROVIDER_SITE_OTHER): Payer: Medicare HMO | Admitting: Physician Assistant

## 2016-06-30 ENCOUNTER — Other Ambulatory Visit (INDEPENDENT_AMBULATORY_CARE_PROVIDER_SITE_OTHER): Payer: Medicare HMO

## 2016-06-30 VITALS — BP 122/76 | HR 86

## 2016-06-30 DIAGNOSIS — K7581 Nonalcoholic steatohepatitis (NASH): Secondary | ICD-10-CM

## 2016-06-30 DIAGNOSIS — K7469 Other cirrhosis of liver: Secondary | ICD-10-CM | POA: Diagnosis not present

## 2016-06-30 LAB — AMMONIA: AMMONIA: 39 umol/L — AB (ref 11–35)

## 2016-06-30 LAB — FERRITIN: Ferritin: 89.4 ng/mL (ref 10.0–291.0)

## 2016-06-30 MED ORDER — INSULIN ASPART 100 UNIT/ML FLEXPEN: 85.0000 [IU] | PEN_INJECTOR | Freq: Three times a day (TID) | SUBCUTANEOUS | 11 refills | Status: DC

## 2016-06-30 NOTE — Progress Notes (Signed)
Subjective:    Patient ID: Sue Martin, female    DOB: 1966-05-09, 51 y.o.   MRN: [id number]  HPI Sue Martin is a 51 year old white female known to Dr. Ardis Hughs who is referred back today by Dr. Colin Benton to discuss abnormal ultrasound and elastography. Patient has history of coronary artery disease, congestive heart failure, peripheral vascular disease and is status post bilateral BKA's in 2013. She also has stage III chronic kidney disease diabetes mellitus and relates that she was told several years ago that she may have cirrhosis. She was seen by Dr. Ardis Hughs in October 2017, and schedule for upper abdominal ultrasound and elastography This was done on 05/05/2016 and shows a nodular liver diffusely and heterogeneous echotexture suggestive of underlying cirrhosis, she also has splenomegaly and gallstones. Metavir  beer fibrosis score is some F3  + F4 . She had previously had hepatitis serologies which were negative, most recent INR 1.3, T bili of 1.4 and LFTs otherwise unremarkable. Patient currently feels at her baseline, she denies any problems with abdominal pain or distention. She is chronically on diuretics because of congestive heart failure with previously documented EF of about 35%. She has no history of EtOH abuse, no family history of liver disease that she is aware of. Review of Systems Pertinent positive and negative review of systems were noted in the above HPI section.  All other review of systems was otherwise negative.  Outpatient Encounter Prescriptions as of 06/30/2016  Medication Sig  . aspirin 325 MG tablet Take 325 mg by mouth at bedtime.   Marland Kitchen atorvastatin (LIPITOR) 20 MG tablet Take 1 tablet (20 mg total) by mouth daily.  . Blood Glucose Monitoring Suppl (ACCU-CHEK AVIVA PLUS) w/Device KIT Use to check blood sugar 3 times a day Dx Code E11.9  . cefpodoxime (VANTIN) 200 MG tablet Take 1 tablet (200 mg total) by mouth 2 (two) times daily.  . cyclobenzaprine (FLEXERIL) 10 MG tablet  Take 10 mg by mouth 3 (three) times daily as needed for muscle spasms.   Marland Kitchen doxycycline (VIBRAMYCIN) 100 MG capsule Take 1 capsule (100 mg total) by mouth 2 (two) times daily.  Marland Kitchen glucose blood (ACCU-CHEK AVIVA) test strip Use as instructed 3 times a day to check blood sugar. Dx code E11.9  . hydrALAZINE (APRESOLINE) 10 MG tablet Take 1 tablet (10 mg total) by mouth 3 (three) times daily.  Marland Kitchen ibuprofen (ADVIL,MOTRIN) 200 MG tablet Take 200 mg by mouth every 6 (six) hours as needed.  . Insulin Glargine (LANTUS SOLOSTAR) 100 UNIT/ML Solostar Pen 55 units every morning and 45 units every evening.  . insulin lispro (HUMALOG) 100 UNIT/ML KiwkPen Inject 35 units with breakfast - 35 lunch-35 units with dinner  . metFORMIN (GLUCOPHAGE-XR) 500 MG 24 hr tablet Take 4 tablets (2,000 mg total) by mouth daily with supper.  . metolazone (ZAROXOLYN) 5 MG tablet TAKE ONE TABLET BY MOUTH ONCE DAILY (30  MINUTES  BEFORE  DEMADEX  DOSE)  . ondansetron (ZOFRAN) 4 MG tablet Take 1 tablet (4 mg total) by mouth every 8 (eight) hours as needed for nausea or vomiting.  Marland Kitchen spironolactone (ALDACTONE) 25 MG tablet Take 1 tablet (25 mg total) by mouth daily.  Marland Kitchen torsemide (DEMADEX) 20 MG tablet Take 1 tablet (20 mg total) by mouth 2 (two) times daily.  . isosorbide mononitrate (IMDUR) 30 MG 24 hr tablet Take 30 mg by mouth 3 (three) times daily.   Marland Kitchen lisinopril (PRINIVIL,ZESTRIL) 10 MG tablet Take 10 mg by mouth at  bedtime.    No facility-administered encounter medications on file as of 06/30/2016.    Allergies  Allergen Reactions  . Broccoli [Brassica Oleracea Italica] Anaphylaxis  . Influenza Vaccines Anaphylaxis    Per patient  . Pneumococcal Vaccines Anaphylaxis    Per patient  . Trulicity [Dulaglutide] Hives and Nausea And Vomiting  . Iron Other (See Comments)    GI intolerance  . Latex Other (See Comments)    blisters  . Lisinopril Nausea Only  . Penicillins Other (See Comments)    GI intolerance, UTI, can tolerate  in IV Tolerates cephalosporins  . Tape Other (See Comments)    Tears skin.  Please use "paper" tape  . Codeine Nausea And Vomiting and Rash  . Sulfa Antibiotics Nausea And Vomiting and Rash  . Sulfur Nausea And Vomiting and Rash   Patient Active Problem List   Diagnosis Date Noted  . Cardiomyopathy, ischemic 06/13/2016  . Type II diabetes mellitus with ophthalmic manifestations, uncontrolled ([place]) 12/22/2014  . Hyperlipidemia 12/22/2014  . CAD (coronary artery disease) 12/12/2014  . Diabetic retinopathy ([place]) 12/12/2014  . PVD (peripheral vascular disease) ([place]) 12/12/2014  . CKD (chronic kidney disease) stage 3, GFR 30-59 ml/min 12/12/2014  . Type 2 diabetes mellitus with diabetic nephropathy ([place]) 11/28/2014  . Chronic systolic congestive heart failure ([place]) 11/28/2014  . Cirrhosis ([place]) 11/28/2014  . Generalized OA 11/28/2014  . S/P bilateral BKA (below knee amputation) ([place]) 11/28/2014   Social History   Social History  . Marital status: Married    Spouse name: N/A  . Number of children: N/A  . Years of education: N/A   Occupational History  . Not on file.   Social History Main Topics  . Smoking status: Never Smoker  . Smokeless tobacco: Not on file  . Alcohol use No  . Drug use: No  . Sexual activity: Not on file   Other Topics Concern  . Not on file   Social History Narrative   Married   Husband is a Administrator       Ms. Fenlon's family history includes Arthritis in her father, maternal grandmother, and mother; CVA in her father; Diabetes in her maternal grandmother and mother; Heart disease in her mother; Hypertension in her father; Mental illness in her mother; Sudden death in her paternal grandfather.      Objective:    Vitals:   06/30/16 0851  BP: 122/76  Pulse: 86    Physical Exam  well-developed white female, status post bilateral BKA's, wheelchair bound somewhat disheveled appearing and accompanied by a friend. Blood pressure 122/76 pulse  86, not weighed today. HEENT ;nontraumatic, cephalic EOMI PERRLA, Sclera anicteric, Neck; supple JVD, Cardiovascular ;regular rate and rhythm with S1-S2 there's soft systolic murmur, Pulmonary; clear bilaterally, Abdomen ;soft no appreciable ascites is nontender there is no palpable mass or hepatosplenomegaly bowel sounds are present, Rectal ;exam not done, Extremities; status post bilateral BKA, Neuropsych ;mood and affect appropriate       Assessment & Plan:   #64 51 year old white female with multiple serious comorbidities with new diagnosis of cirrhosis, likely [place]. Current MELD= 12 We will rule out viral etiologies and other metabolic causes of chronic liver disease #2 coronary artery disease #3 congestive heart failure with EF 35% #4 adult-onset diabetes mellitus insulin-dependent #5 chronic kidney disease stage III #6 peripheral vascular disease status post bilateral BKA  Plan; we'll check ferritin, ANA, AMA, ceruloplasmin, alpha-1 antitrypsin and viral serologies and AFP Schedule for upper endoscopy with Dr.  Ardis Hughs for surveillance for esophageal varices. This will need to be scheduled at the hospital due to her chronic of mobility. Long discussion today regarding diagnosis of cirrhosis, and need for ongoing follow-up and management of potential complications.    Amy S Esterwood PA-C 06/30/2016   Cc: Lucretia Kern, DO

## 2016-06-30 NOTE — Patient Instructions (Signed)
We will call you with the lab results. You have been scheduled for an endoscopy. Please follow written instructions given to you at your visit today. If you use inhalers (even only as needed), please bring them with you on the day of your procedure. Your physician has requested that you go to www.startemmi.com and enter the access code given to you at your visit today. This web site gives a general overview about your procedure. However, you should still follow specific instructions given to you by our office regarding your preparation for the procedure.

## 2016-07-01 LAB — ANA: Anti Nuclear Antibody(ANA): POSITIVE — AB

## 2016-07-01 LAB — HEPATITIS PANEL, ACUTE
HCV AB: NEGATIVE
HEP B S AG: NEGATIVE
Hep A IgM: NONREACTIVE
Hep B C IgM: NONREACTIVE

## 2016-07-01 LAB — ANTI-NUCLEAR AB-TITER (ANA TITER): ANA Titer 1: 1:40 {titer} — ABNORMAL HIGH

## 2016-07-01 LAB — AFP TUMOR MARKER: AFP TUMOR MARKER: 1.8 ng/mL (ref <6.1)

## 2016-07-01 LAB — MITOCHONDRIAL ANTIBODIES: Mitochondrial M2 Ab, IgG: <=20 Units (ref <=20.0)

## 2016-07-01 NOTE — Progress Notes (Signed)
I agree with the above note, plan 

## 2016-07-04 ENCOUNTER — Encounter (HOSPITAL_COMMUNITY): Payer: Self-pay | Admitting: *Deleted

## 2016-07-04 LAB — ANTI-SMOOTH MUSCLE ANTIBODY, IGG: SMOOTH MUSCLE AB: 21 U — AB (ref <20)

## 2016-07-05 LAB — CERULOPLASMIN: Ceruloplasmin: 43 mg/dL (ref 18–53)

## 2016-07-05 LAB — ALPHA-1-ANTITRYPSIN: A-1 Antitrypsin, Ser: 193 mg/dL (ref 83–199)

## 2016-07-07 ENCOUNTER — Ambulatory Visit (HOSPITAL_COMMUNITY): Payer: Medicare HMO | Admitting: Anesthesiology

## 2016-07-07 ENCOUNTER — Encounter (HOSPITAL_COMMUNITY)
Admission: RE | Disposition: A | Discharge status: Home / Self Care | Payer: Self-pay | Source: Ambulatory Visit | Attending: Gastroenterology

## 2016-07-07 ENCOUNTER — Ambulatory Visit (HOSPITAL_COMMUNITY)
Admission: RE | Admit: 2016-07-07 | Discharge: 2016-07-07 | Disposition: A | Discharge status: Home / Self Care | Payer: Medicare HMO | Source: Ambulatory Visit | Attending: Gastroenterology | Admitting: Gastroenterology

## 2016-07-07 ENCOUNTER — Encounter (HOSPITAL_COMMUNITY): Payer: Self-pay | Admitting: *Deleted

## 2016-07-07 DIAGNOSIS — I255 Ischemic cardiomyopathy: Secondary | ICD-10-CM | POA: Insufficient documentation

## 2016-07-07 DIAGNOSIS — E1151 Type 2 diabetes mellitus with diabetic peripheral angiopathy without gangrene: Secondary | ICD-10-CM | POA: Diagnosis not present

## 2016-07-07 DIAGNOSIS — Z89511 Acquired absence of right leg below knee: Secondary | ICD-10-CM | POA: Insufficient documentation

## 2016-07-07 DIAGNOSIS — I5022 Chronic systolic (congestive) heart failure: Secondary | ICD-10-CM | POA: Diagnosis not present

## 2016-07-07 DIAGNOSIS — Z794 Long term (current) use of insulin: Secondary | ICD-10-CM | POA: Diagnosis not present

## 2016-07-07 DIAGNOSIS — E1122 Type 2 diabetes mellitus with diabetic chronic kidney disease: Secondary | ICD-10-CM | POA: Insufficient documentation

## 2016-07-07 DIAGNOSIS — I2510 Atherosclerotic heart disease of native coronary artery without angina pectoris: Secondary | ICD-10-CM | POA: Diagnosis not present

## 2016-07-07 DIAGNOSIS — N183 Chronic kidney disease, stage 3 (moderate): Secondary | ICD-10-CM | POA: Insufficient documentation

## 2016-07-07 DIAGNOSIS — Z993 Dependence on wheelchair: Secondary | ICD-10-CM | POA: Diagnosis not present

## 2016-07-07 DIAGNOSIS — K7460 Unspecified cirrhosis of liver: Secondary | ICD-10-CM | POA: Diagnosis not present

## 2016-07-07 DIAGNOSIS — Z13810 Encounter for screening for upper gastrointestinal disorder: Secondary | ICD-10-CM | POA: Diagnosis not present

## 2016-07-07 DIAGNOSIS — Z79899 Other long term (current) drug therapy: Secondary | ICD-10-CM | POA: Insufficient documentation

## 2016-07-07 DIAGNOSIS — E11319 Type 2 diabetes mellitus with unspecified diabetic retinopathy without macular edema: Secondary | ICD-10-CM | POA: Insufficient documentation

## 2016-07-07 DIAGNOSIS — R69 Illness, unspecified: Secondary | ICD-10-CM | POA: Diagnosis not present

## 2016-07-07 DIAGNOSIS — E785 Hyperlipidemia, unspecified: Secondary | ICD-10-CM | POA: Insufficient documentation

## 2016-07-07 DIAGNOSIS — Z89512 Acquired absence of left leg below knee: Secondary | ICD-10-CM | POA: Diagnosis not present

## 2016-07-07 DIAGNOSIS — Z7982 Long term (current) use of aspirin: Secondary | ICD-10-CM | POA: Diagnosis not present

## 2016-07-07 HISTORY — PX: ESOPHAGOGASTRODUODENOSCOPY (EGD) WITH PROPOFOL: SHX5813

## 2016-07-07 LAB — GLUCOSE, CAPILLARY
GLUCOSE-CAPILLARY: 85 mg/dL (ref 65–99)
Glucose-Capillary: 70 mg/dL (ref 65–99)
Glucose-Capillary: 105 mg/dL — ABNORMAL HIGH (ref 65–99)

## 2016-07-07 SURGERY — ESOPHAGOGASTRODUODENOSCOPY (EGD) WITH PROPOFOL
Anesthesia: Monitor Anesthesia Care

## 2016-07-07 MED ORDER — PROPOFOL 10 MG/ML IV BOLUS
INTRAVENOUS | Status: AC
  Filled 2016-07-07: qty 60

## 2016-07-07 MED ORDER — LIDOCAINE 2% (20 MG/ML) 5 ML SYRINGE
INTRAMUSCULAR | Status: AC
  Filled 2016-07-07: qty 5

## 2016-07-07 MED ORDER — LIDOCAINE 2% (20 MG/ML) 5 ML SYRINGE
INTRAMUSCULAR | Status: DC | PRN
  Administered 2016-07-07: 100 mg via INTRAVENOUS

## 2016-07-07 MED ORDER — ONDANSETRON HCL 4 MG/2ML IJ SOLN
INTRAMUSCULAR | Status: DC | PRN
  Administered 2016-07-07: 4 mg via INTRAVENOUS

## 2016-07-07 MED ORDER — LACTATED RINGERS IV SOLN
INTRAVENOUS | Status: DC
  Administered 2016-07-07: 1000 mL via INTRAVENOUS
  Administered 2016-07-07: 09:00:00 via INTRAVENOUS

## 2016-07-07 MED ORDER — PROPOFOL 10 MG/ML IV BOLUS
INTRAVENOUS | Status: DC | PRN
  Administered 2016-07-07: 10 mg via INTRAVENOUS

## 2016-07-07 MED ORDER — ONDANSETRON HCL 4 MG/2ML IJ SOLN
INTRAMUSCULAR | Status: AC
  Filled 2016-07-07: qty 2

## 2016-07-07 MED ORDER — PROPOFOL 500 MG/50ML IV EMUL
INTRAVENOUS | Status: DC | PRN
  Administered 2016-07-07: 75 ug/kg/min via INTRAVENOUS

## 2016-07-07 SURGICAL SUPPLY — 14 items

## 2016-07-07 NOTE — Interval H&P Note (Signed)
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 After consideration of risks, benefits and other options for treatment, the patient has consented to  Procedure(s): ESOPHAGOGASTRODUODENOSCOPY (EGD) WITH PROPOFOL (N/A) as a surgical intervention .  The patient's history has been reviewed, patient examined, no change in status, stable for surgery.  I have reviewed the patient's chart and labs.  Questions were answered to the patient's satisfaction.     Rachael FeeJacobs, Daniel P

## 2016-07-07 NOTE — Discharge Instructions (Signed)

## 2016-07-07 NOTE — H&P (View-Only) (Signed)
Subjective:    Patient ID: Sue Martin, female    DOB: 09-25-1965, 51 y.o.   MRN: [id number]  HPI Sue Martin is a 51 year old white female known to Dr. Ardis Hughs who is referred back today by Dr. Colin Benton to discuss abnormal ultrasound and elastography. Patient has history of coronary artery disease, congestive heart failure, peripheral vascular disease and is status post bilateral BKA's in 2013. She also has stage III chronic kidney disease diabetes mellitus and relates that she was told several years ago that she may have cirrhosis. She was seen by Dr. Ardis Hughs in October 2017, and schedule for upper abdominal ultrasound and elastography This was done on 05/05/2016 and shows a nodular liver diffusely and heterogeneous echotexture suggestive of underlying cirrhosis, she also has splenomegaly and gallstones. Metavir  beer fibrosis score is some F3  + F4 . She had previously had hepatitis serologies which were negative, most recent INR 1.3, T bili of 1.4 and LFTs otherwise unremarkable. Patient currently feels at her baseline, she denies any problems with abdominal pain or distention. She is chronically on diuretics because of congestive heart failure with previously documented EF of about 35%. She has no history of EtOH abuse, no family history of liver disease that she is aware of. Review of Systems Pertinent positive and negative review of systems were noted in the above HPI section.  All other review of systems was otherwise negative.  Outpatient Encounter Prescriptions as of 06/30/2016  Medication Sig  . aspirin 325 MG tablet Take 325 mg by mouth at bedtime.   Marland Kitchen atorvastatin (LIPITOR) 20 MG tablet Take 1 tablet (20 mg total) by mouth daily.  . Blood Glucose Monitoring Suppl (ACCU-CHEK AVIVA PLUS) w/Device KIT Use to check blood sugar 3 times a day Dx Code E11.9  . cefpodoxime (VANTIN) 200 MG tablet Take 1 tablet (200 mg total) by mouth 2 (two) times daily.  . cyclobenzaprine (FLEXERIL) 10 MG tablet  Take 10 mg by mouth 3 (three) times daily as needed for muscle spasms.   Marland Kitchen doxycycline (VIBRAMYCIN) 100 MG capsule Take 1 capsule (100 mg total) by mouth 2 (two) times daily.  Marland Kitchen glucose blood (ACCU-CHEK AVIVA) test strip Use as instructed 3 times a day to check blood sugar. Dx code E11.9  . hydrALAZINE (APRESOLINE) 10 MG tablet Take 1 tablet (10 mg total) by mouth 3 (three) times daily.  Marland Kitchen ibuprofen (ADVIL,MOTRIN) 200 MG tablet Take 200 mg by mouth every 6 (six) hours as needed.  . Insulin Glargine (LANTUS SOLOSTAR) 100 UNIT/ML Solostar Pen 55 units every morning and 45 units every evening.  . insulin lispro (HUMALOG) 100 UNIT/ML KiwkPen Inject 35 units with breakfast - 35 lunch-35 units with dinner  . metFORMIN (GLUCOPHAGE-XR) 500 MG 24 hr tablet Take 4 tablets (2,000 mg total) by mouth daily with supper.  . metolazone (ZAROXOLYN) 5 MG tablet TAKE ONE TABLET BY MOUTH ONCE DAILY (30  MINUTES  BEFORE  DEMADEX  DOSE)  . ondansetron (ZOFRAN) 4 MG tablet Take 1 tablet (4 mg total) by mouth every 8 (eight) hours as needed for nausea or vomiting.  Marland Kitchen spironolactone (ALDACTONE) 25 MG tablet Take 1 tablet (25 mg total) by mouth daily.  Marland Kitchen torsemide (DEMADEX) 20 MG tablet Take 1 tablet (20 mg total) by mouth 2 (two) times daily.  . isosorbide mononitrate (IMDUR) 30 MG 24 hr tablet Take 30 mg by mouth 3 (three) times daily.   Marland Kitchen lisinopril (PRINIVIL,ZESTRIL) 10 MG tablet Take 10 mg by mouth at  bedtime.    No facility-administered encounter medications on file as of 06/30/2016.    Allergies  Allergen Reactions  . Broccoli [Brassica Oleracea Italica] Anaphylaxis  . Influenza Vaccines Anaphylaxis    Per patient  . Pneumococcal Vaccines Anaphylaxis    Per patient  . Trulicity [Dulaglutide] Hives and Nausea And Vomiting  . Iron Other (See Comments)    GI intolerance  . Latex Other (See Comments)    blisters  . Lisinopril Nausea Only  . Penicillins Other (See Comments)    GI intolerance, UTI, can tolerate  in IV Tolerates cephalosporins  . Tape Other (See Comments)    Tears skin.  Please use "paper" tape  . Codeine Nausea And Vomiting and Rash  . Sulfa Antibiotics Nausea And Vomiting and Rash  . Sulfur Nausea And Vomiting and Rash   Patient Active Problem List   Diagnosis Date Noted  . Cardiomyopathy, ischemic 06/13/2016  . Type II diabetes mellitus with ophthalmic manifestations, uncontrolled ([place]) 12/22/2014  . Hyperlipidemia 12/22/2014  . CAD (coronary artery disease) 12/12/2014  . Diabetic retinopathy ([place]) 12/12/2014  . PVD (peripheral vascular disease) ([place]) 12/12/2014  . CKD (chronic kidney disease) stage 3, GFR 30-59 ml/min 12/12/2014  . Type 2 diabetes mellitus with diabetic nephropathy ([place]) 11/28/2014  . Chronic systolic congestive heart failure ([place]) 11/28/2014  . Cirrhosis ([place]) 11/28/2014  . Generalized OA 11/28/2014  . S/P bilateral BKA (below knee amputation) ([place]) 11/28/2014   Social History   Social History  . Marital status: Married    Spouse name: N/A  . Number of children: N/A  . Years of education: N/A   Occupational History  . Not on file.   Social History Main Topics  . Smoking status: Never Smoker  . Smokeless tobacco: Not on file  . Alcohol use No  . Drug use: No  . Sexual activity: Not on file   Other Topics Concern  . Not on file   Social History Narrative   Married   Husband is a Administrator       Sue Martin's family history includes Arthritis in her father, maternal grandmother, and mother; CVA in her father; Diabetes in her maternal grandmother and mother; Heart disease in her mother; Hypertension in her father; Mental illness in her mother; Sudden death in her paternal grandfather.      Objective:    Vitals:   06/30/16 0851  BP: 122/76  Pulse: 86    Physical Exam  well-developed white female, status post bilateral BKA's, wheelchair bound somewhat disheveled appearing and accompanied by a friend. Blood pressure 122/76 pulse  86, not weighed today. HEENT ;nontraumatic, cephalic EOMI PERRLA, Sclera anicteric, Neck; supple JVD, Cardiovascular ;regular rate and rhythm with S1-S2 there's soft systolic murmur, Pulmonary; clear bilaterally, Abdomen ;soft no appreciable ascites is nontender there is no palpable mass or hepatosplenomegaly bowel sounds are present, Rectal ;exam not done, Extremities; status post bilateral BKA, Neuropsych ;mood and affect appropriate       Assessment & Plan:   #60 51 year old white female with multiple serious comorbidities with new diagnosis of cirrhosis, likely [place]. Current MELD= 12 We will rule out viral etiologies and other metabolic causes of chronic liver disease #2 coronary artery disease #3 congestive heart failure with EF 35% #4 adult-onset diabetes mellitus insulin-dependent #5 chronic kidney disease stage III #6 peripheral vascular disease status post bilateral BKA  Plan; we'll check ferritin, ANA, AMA, ceruloplasmin, alpha-1 antitrypsin and viral serologies and AFP Schedule for upper endoscopy with Dr.  Ardis Hughs for surveillance for esophageal varices. This will need to be scheduled at the hospital due to her chronic of mobility. Long discussion today regarding diagnosis of cirrhosis, and need for ongoing follow-up and management of potential complications.    Amy S Esterwood PA-C 06/30/2016   Cc: Lucretia Kern, DO

## 2016-07-07 NOTE — Progress Notes (Signed)
Patient out of procedure room.  CBG pre op was 70.  Post of CBG was 85.  Per anesthesia MD gave patient 4 ounces juice for CBG below 90.  Patient rechecked at 20 minutes with a CBG of 105.  Dishcharge to home.  [facility] was called and patient was released with friend Melina Schoolsammy Wallace.

## 2016-07-07 NOTE — Anesthesia Postprocedure Evaluation (Addendum)
Anesthesia Post Note  Patient: Sue Martin  Procedure(s) Performed: Procedure(s) (LRB): ESOPHAGOGASTRODUODENOSCOPY (EGD) WITH PROPOFOL (N/A)  Patient location during evaluation: Endoscopy Anesthesia Type: MAC Level of consciousness: awake, awake and alert and oriented Pain management: pain level controlled Vital Signs Assessment: post-procedure vital signs reviewed and stable Respiratory status: spontaneous breathing and respiratory function stable Cardiovascular status: blood pressure returned to baseline Anesthetic complications: no       Last Vitals:  Vitals:   07/07/16 0801 07/07/16 0915  BP: (!) 165/71 (!) 139/104  Pulse: 83 75  Resp: 19 (!) 24  Temp: 36.7 C 36.7 C    Last Pain:  Vitals:   07/07/16 0915  TempSrc: Oral                 Elige Shouse COKER

## 2016-07-07 NOTE — Op Note (Signed)
[facility] Patient Name: Sue Martin Procedure Date: 07/07/2016 MRN: [id number] Attending MD: Rachael Fee , MD Date of Birth: [birth year] CSN: [id number] Age: 51 Admit Type: Outpatient Procedure:                Upper GI endoscopy Indications:              Screening procedure; recently diagnosed cirrhosis,                            here for variceal screening Providers:                Rachael Fee, MD, Dwain Sarna, RN, Oletha Blend, Technician Referring MD:              Medicines:                Monitored Anesthesia Care Complications:            No immediate complications. Estimated blood loss:                            None. Estimated Blood Loss:     Estimated blood loss: none. Procedure:                Pre-Anesthesia Assessment:                           - Prior to the procedure, a History and Physical                            was performed, and patient medications and                            allergies were reviewed. The patient's tolerance of                            previous anesthesia was also reviewed. The risks                            and benefits of the procedure and the sedation                            options and risks were discussed with the patient.                            All questions were answered, and informed consent                            was obtained. Prior Anticoagulants: The patient has                            taken no previous anticoagulant or antiplatelet                            agents. ASA Grade Assessment:  IV - A patient with                            severe systemic disease that is a constant threat                            to life. After reviewing the risks and benefits,                            the patient was deemed in satisfactory condition to                            undergo the procedure.                           After obtaining informed consent, the  endoscope was                            passed under direct vision. Throughout the                            procedure, the patient's blood pressure, pulse, and                            oxygen saturations were monitored continuously. The                            [id number]) scope was introduced through the                            mouth, and advanced to the second part of duodenum.                            The upper GI endoscopy was accomplished without                            difficulty. The patient tolerated the procedure                            well. Scope In: Scope Out: Findings:      The esophagus was normal.      The stomach was normal.      The examined duodenum was normal. Impression:               - Normal esophagus.                           - Normal stomach.                           - Normal examined duodenum.                           - No specimens collected. Moderate Sedation:      N/A- Per Anesthesia Care Recommendation:           -  Patient has a contact number available for                            emergencies. The signs and symptoms of potential                            delayed complications were discussed with the                            patient. Return to normal activities tomorrow.                            Written discharge instructions were provided to the                            patient.                           - Low Salt diet (2gm sodium daily max)                           - Continue present medications.                           - No repeat upper endoscopy.                           - Return to GI office in 2 months. My office will                            call you to arrange this. Procedure Code(s):        --- Professional ---                           [phone], Esophagogastroduodenoscopy, flexible,                            transoral; diagnostic, including collection of                            specimen(s) by  brushing or washing, when performed                            (separate procedure) Diagnosis Code(s):        --- Professional ---                           Z13.810, Encounter for screening for upper                            gastrointestinal disorder CPT copyright 2016 American Medical Association. All rights reserved. The codes documented in this report are preliminary and upon coder review may  be revised to meet current compliance requirements. Rachael Fee, MD 07/07/2016 9:11:03 AM This report has been signed electronically. Number of Addenda: 0

## 2016-07-07 NOTE — Anesthesia Preprocedure Evaluation (Addendum)
Anesthesia Evaluation  Patient identified by MRN, date of birth, ID band Patient awake    Reviewed: Allergy & Precautions, NPO status , Patient's Chart, lab work & pertinent test results  Airway Mallampati: II  TM Distance: >3 FB Neck ROM: Full    Dental  (+) Teeth Intact, Dental Advisory Given   Pulmonary    breath sounds clear to auscultation       Cardiovascular  Rhythm:Regular Rate:Normal     Neuro/Psych    GI/Hepatic   Endo/Other  diabetes  Renal/GU      Musculoskeletal   Abdominal   Peds  Hematology   Anesthesia Other Findings   Reproductive/Obstetrics                            Anesthesia Physical Anesthesia Plan  ASA: III  Anesthesia Plan: MAC   Post-op Pain Management:    Induction: Intravenous  Airway Management Planned: Natural Airway and Nasal Cannula  Additional Equipment:   Intra-op Plan:   Post-operative Plan:   Informed Consent: I have reviewed the patients History and Physical, chart, labs and discussed the procedure including the risks, benefits and alternatives for the proposed anesthesia with the patient or authorized representative who has indicated his/her understanding and acceptance.     Plan Discussed with:   Anesthesia Plan Comments:         Anesthesia Quick Evaluation

## 2016-07-07 NOTE — Transfer of Care (Signed)
Immediate Anesthesia Transfer of Care Note  Patient: Sue Martin  Procedure(s) Performed: Procedure(s): ESOPHAGOGASTRODUODENOSCOPY (EGD) WITH PROPOFOL (N/A)  Patient Location: PACU  Anesthesia Type:MAC  Level of Consciousness:  sedated, patient cooperative and responds to stimulation  Airway & Oxygen Therapy:Patient Spontanous Breathing and Patient connected to face mask oxgen  Post-op Assessment:  Report given to PACU RN and Post -op Vital signs reviewed and stable  Post vital signs:  Reviewed and stable  Last Vitals:  Vitals:   07/07/16 0747 07/07/16 0801  BP: (!) 165/71 (!) 165/71  Pulse: 83 83  Resp: 18 19  Temp: 36.7 C 09.3 C    Complications: No apparent anesthesia complications

## 2016-07-08 ENCOUNTER — Encounter (HOSPITAL_COMMUNITY): Payer: Self-pay | Admitting: Gastroenterology

## 2016-07-28 ENCOUNTER — Other Ambulatory Visit: Payer: Self-pay | Admitting: Endocrinology

## 2016-07-28 ENCOUNTER — Other Ambulatory Visit: Payer: Self-pay | Admitting: Family Medicine

## 2016-08-01 ENCOUNTER — Telehealth: Payer: Self-pay | Admitting: Endocrinology

## 2016-08-01 ENCOUNTER — Ambulatory Visit: Payer: No Typology Code available for payment source | Admitting: Endocrinology

## 2016-08-01 NOTE — Telephone Encounter (Signed)
Patient no showed today's appt. Please advise on how to follow up. °A. No follow up necessary. °B. Follow up urgent. Contact patient immediately. °C. Follow up necessary. Contact patient and schedule visit in ___ days. °D. Follow up advised. Contact patient and schedule visit in ____weeks. ° °

## 2016-08-01 NOTE — Telephone Encounter (Signed)
LM for pt to call backl to reschedule

## 2016-08-01 NOTE — Telephone Encounter (Signed)
Reschedule for follow-up at the earliest available appointment

## 2016-09-06 ENCOUNTER — Ambulatory Visit: Payer: Medicare HMO | Admitting: Gastroenterology

## 2016-09-12 ENCOUNTER — Encounter: Payer: Self-pay | Admitting: Family Medicine

## 2016-09-12 ENCOUNTER — Telehealth: Payer: Self-pay | Admitting: *Deleted

## 2016-09-12 ENCOUNTER — Ambulatory Visit (INDEPENDENT_AMBULATORY_CARE_PROVIDER_SITE_OTHER): Payer: Medicare HMO | Admitting: Family Medicine

## 2016-09-12 VITALS — BP 110/76 | HR 80 | Temp 99.2°F | Ht <= 58 in

## 2016-09-12 DIAGNOSIS — N183 Chronic kidney disease, stage 3 unspecified: Secondary | ICD-10-CM

## 2016-09-12 DIAGNOSIS — E1165 Type 2 diabetes mellitus with hyperglycemia: Secondary | ICD-10-CM

## 2016-09-12 DIAGNOSIS — R05 Cough: Secondary | ICD-10-CM

## 2016-09-12 DIAGNOSIS — Z794 Long term (current) use of insulin: Secondary | ICD-10-CM

## 2016-09-12 DIAGNOSIS — Z89511 Acquired absence of right leg below knee: Secondary | ICD-10-CM | POA: Diagnosis not present

## 2016-09-12 DIAGNOSIS — K7460 Unspecified cirrhosis of liver: Secondary | ICD-10-CM

## 2016-09-12 DIAGNOSIS — R0981 Nasal congestion: Secondary | ICD-10-CM | POA: Diagnosis not present

## 2016-09-12 DIAGNOSIS — E11319 Type 2 diabetes mellitus with unspecified diabetic retinopathy without macular edema: Secondary | ICD-10-CM | POA: Diagnosis not present

## 2016-09-12 DIAGNOSIS — I739 Peripheral vascular disease, unspecified: Secondary | ICD-10-CM | POA: Diagnosis not present

## 2016-09-12 DIAGNOSIS — Z89512 Acquired absence of left leg below knee: Secondary | ICD-10-CM | POA: Diagnosis not present

## 2016-09-12 DIAGNOSIS — I5022 Chronic systolic (congestive) heart failure: Secondary | ICD-10-CM | POA: Diagnosis not present

## 2016-09-12 DIAGNOSIS — R059 Cough, unspecified: Secondary | ICD-10-CM

## 2016-09-12 DIAGNOSIS — I2510 Atherosclerotic heart disease of native coronary artery without angina pectoris: Secondary | ICD-10-CM

## 2016-09-12 LAB — BASIC METABOLIC PANEL
BUN: 40 mg/dL — AB (ref 6–23)
CO2: 40 mEq/L — ABNORMAL HIGH (ref 19–32)
Calcium: 9.1 mg/dL (ref 8.4–10.5)
Chloride: 83 mEq/L — ABNORMAL LOW (ref 96–112)
Creatinine, Ser: 1.32 mg/dL — ABNORMAL HIGH (ref 0.40–1.20)
GFR: 45.34 mL/min — AB (ref >60.00)
Glucose, Bld: 327 mg/dL — ABNORMAL HIGH (ref 70–99)
POTASSIUM: 2.6 meq/L — AB (ref 3.5–5.1)
SODIUM: 136 meq/L (ref 135–145)

## 2016-09-12 LAB — CBC
HCT: 48.4 % — ABNORMAL HIGH (ref 36.0–46.0)
Hemoglobin: 16.4 g/dL — ABNORMAL HIGH (ref 12.0–15.0)
MCHC: 33.9 g/dL (ref 30.0–36.0)
MCV: 91.2 fl (ref 78.0–100.0)
PLATELETS: 115 10*3/uL — AB (ref 150.0–400.0)
RBC: 5.31 Mil/uL — ABNORMAL HIGH (ref 3.87–5.11)
RDW: 16.3 % — ABNORMAL HIGH (ref 11.5–15.5)
WBC: 7.4 10*3/uL (ref 4.0–10.5)

## 2016-09-12 LAB — HEMOGLOBIN A1C: HEMOGLOBIN A1C: 8.7 % — AB (ref 4.6–6.5)

## 2016-09-12 MED ORDER — DOXYCYCLINE HYCLATE 100 MG PO TABS: 100.0000 mg | ORAL_TABLET | Freq: Two times a day (BID) | ORAL | 0 refills | Status: DC

## 2016-09-12 NOTE — Telephone Encounter (Signed)
CRITICAL VALUE STICKER  CRITICAL VALUE: K+ 2.6  RECEIVER (on-site recipient of call): Memory Argueonstance Morton, RN   DATE & TIME NOTIFIED: 09/12/16 1616  MESSENGER (representative from lab): Hope Scales, MLT  MD NOTIFIED: Dr. Kriste BasqueHannah [name]  TIME OF NOTIFICATION: 1629  RESPONSE: MD notified and will review chart and follow up

## 2016-09-12 NOTE — Patient Instructions (Signed)
BEFORE YOU LEAVE: -xray sheet -labs -follow up: Medicare exam with Darl PikesSusan at earliest convenience; follow up Dr. Selena BattenKim 3 months  Take the antibiotic.  Get the chest xray.  Go to the hospital if you are feeling any worse or you are not felling better

## 2016-09-12 NOTE — Progress Notes (Signed)
Pre visit review using our clinic review tool, if applicable. No additional management support is needed unless otherwise documented below in the visit note. 

## 2016-09-12 NOTE — Progress Notes (Signed)
HPI:  Sue Martin is a pleasant 51 yo with a very complicated PMH significant for bilat amputation, extensive CV disease (managed by cardiologist), DM with nephropathy and retinopathy (managed by endocrinologist), HLD, Cirrhosis (sees GI) and depression here for follow up. Reports chronic issues stable.  New issue of sinus congestion and productive cough for 2 weeks. Thick sinus mucus. PND. Sinus pressure and mild SOB. No fever, chills, vomiting. Does have body aches - but chronic issue. Reports SOB at times, but reports this is chronic and stable. Due for labs and pap. Refuses vaccines. Refuses pap here - we have advised and offered to assist her on scheduling pap many times. She also refuses wts here - follows with her cardiologist.  ROS: See pertinent positives and negatives per HPI.  Past Medical History:  Diagnosis Date  . CAD (coronary artery disease) 12/12/2014   -s/p 2V PCI of the LAD/RCA with taxus stents -cardiologist is Dr. Carlis Abbott, [facility]   . CHF (congestive heart failure) ([place])    sees Dr. Novella Rob  . Chronic kidney disease   . Depression   . Diabetes mellitus with renal complications ([facility])    PVD and retinopathy, S/p bilat ambutations  . Glaucoma   . Liver cirrhosis ([place])   . Lyme disease   . Retinal detachment    sees Dr. Rose Phi per her report  . S/P bilateral BKA (below knee amputation) ([place]) 11/28/2014    Past Surgical History:  Procedure Laterality Date  . ESOPHAGOGASTRODUODENOSCOPY (EGD) WITH PROPOFOL N/A 07/07/2016   Procedure: ESOPHAGOGASTRODUODENOSCOPY (EGD) WITH PROPOFOL;  Surgeon: Milus Banister, MD;  Location: [facility];  Service: Endoscopy;  Laterality: N/A;  . LASIK    . LEG AMPUTATION BELOW KNEE  09/09/2011, 09/11/2011  . TUBAL LIGATION      Family History  Problem Relation Age of Onset  . Arthritis Mother   . Heart disease Mother   . Mental illness Mother   . Diabetes Mother   . Arthritis Maternal Grandmother   . Diabetes Maternal  Grandmother   . Arthritis Father   . CVA Father   . Hypertension Father   . Sudden death Paternal Grandfather     Social History   Social History  . Marital status: Married    Spouse name: N/A  . Number of children: N/A  . Years of education: N/A   Social History Main Topics  . Smoking status: Never Smoker  . Smokeless tobacco: Never Used  . Alcohol use No  . Drug use: No  . Sexual activity: Not Asked   Other Topics Concern  . None   Social History Narrative   Married   Husband is a Administrator        Current Outpatient Prescriptions:  .  aspirin 325 MG tablet, Take 325 mg by mouth at bedtime. , Disp: , Rfl:  .  atorvastatin (LIPITOR) 20 MG tablet, Take 1 tablet (20 mg total) by mouth daily. (Patient taking differently: Take 20 mg by mouth every evening. ), Disp: 90 tablet, Rfl: 1 .  Blood Glucose Monitoring Suppl (ACCU-CHEK AVIVA PLUS) w/Device KIT, Use to check blood sugar 3 times a day Dx Code E11.9, Disp: 1 kit, Rfl: 0 .  cyclobenzaprine (FLEXERIL) 10 MG tablet, Take 10 mg by mouth 2 (two) times daily as needed for muscle spasms. , Disp: , Rfl:  .  diphenhydrAMINE (BENADRYL) 25 MG tablet, Take 50 mg by mouth 2 (two) times daily as needed for allergies., Disp: , Rfl:  .  glucose blood (ACCU-CHEK AVIVA) test strip, Use as instructed 3 times a day to check blood sugar. Dx code E11.9, Disp: 100 each, Rfl: 5 .  ibuprofen (ADVIL,MOTRIN) 200 MG tablet, Take 200 mg by mouth daily as needed for moderate pain. , Disp: , Rfl:  .  insulin aspart (NOVOLOG) 100 UNIT/ML FlexPen, Inject 85 Units into the skin 3 (three) times daily with meals. Inject 25 units with Breakfast and Lunch, 35 units with dinner (Patient taking differently: Inject 35 Units into the skin 3 (three) times daily with meals. ), Disp: 30 mL, Rfl: 11 .  LANTUS SOLOSTAR 100 UNIT/ML Solostar Pen, INJECT 55 UNITS SUBCUTANEOUSLY EVERY MORNING AND 45 UNITS EVERY EVENING, Disp: 30 pen, Rfl: 1 .  metFORMIN (GLUCOPHAGE-XR)  500 MG 24 hr tablet, Take 4 tablets (2,000 mg total) by mouth daily with supper. (Patient taking differently: Take 500 mg by mouth 4 (four) times daily. ), Disp: 120 tablet, Rfl: 3 .  metolazone (ZAROXOLYN) 5 MG tablet, TAKE ONE TABLET BY MOUTH ONCE DAILY (30  MINUTES  BEFORE  DEMADEX  DOSE) (Patient taking differently: Take 24ms at night), Disp: 30 tablet, Rfl: 11 .  spironolactone (ALDACTONE) 25 MG tablet, Take 1 tablet (25 mg total) by mouth daily. (Patient taking differently: Take 25 mg by mouth at bedtime. ), Disp: 90 tablet, Rfl: 3 .  torsemide (DEMADEX) 20 MG tablet, TAKE ONE TABLET BY MOUTH TWICE DAILY, Disp: 60 tablet, Rfl: 5 .  doxycycline (VIBRA-TABS) 100 MG tablet, Take 1 tablet (100 mg total) by mouth 2 (two) times daily., Disp: 10 tablet, Rfl: 0 .  hydrALAZINE (APRESOLINE) 10 MG tablet, Take 1 tablet (10 mg total) by mouth 3 (three) times daily., Disp: 270 tablet, Rfl: 3 .  isosorbide mononitrate (IMDUR) 30 MG 24 hr tablet, Take 30 mg by mouth 3 (three) times daily. , Disp: , Rfl:  .  lisinopril (PRINIVIL,ZESTRIL) 10 MG tablet, Take 10 mg by mouth at bedtime. , Disp: , Rfl:   EXAM:  Vitals:   09/12/16 1254  BP: 110/76  Pulse: 80  Temp: 99.2 F (37.3 C)    There is no height or weight on file to calculate BMI.  GENERAL: vitals reviewed and listed above, alert, oriented, appears well hydrated and in no acute distress  HEENT: atraumatic, conjunttiva clear, no obvious abnormalities on inspection of external nose and ears, normal appearance of ear canals and TMs, clear nasal congestion, mild post oropharyngeal erythema with PND, no tonsillar edema or exudate, no sinus TTP  NECK: no obvious masses on inspection  LUNGS: clear to auscultation bilaterally, no wheezes, rales or rhonchi, good air movement  CV: HRRR, no peripheral edema  MS: moves all extremities without noticeable abnormality  PSYCH: pleasant and cooperative, no obvious depression or anxiety  ASSESSMENT AND  PLAN:  Discussed the following assessment and plan:  Cough - Plan: Basic metabolic panel, DG Chest 2 View, CBC Sinus congestion -abx for sinusitis -cxr to exclude lung issue  Uncontrolled type 2 diabetes mellitus with retinopathy, with long-term current use of insulin, macular edema presence unspecified, unspecified laterality, unspecified retinopathy severity ([place] - Plan: Basic metabolic panel, Hemoglobin A1c -labs, sees endo for management -sees nephrology and optho for management complications -lifestyle recs  CKD (chronic kidney disease) stage 3, GFR 30-59 ml/min -sees nephrologist  Cirrhosis of liver without ascites, unspecified hepatic cirrhosis type ([place] -seeing GI  S/P bilateral BKA (below knee amputation) ([place] -in [place]  Coronary artery disease involving native coronary artery of native heart  without angina pectoris PVD (peripheral vascular disease) ([place]) CHF -sees cardiologist for management  -labs per orders -sees a number of specialists -Patient advised to return or notify a doctor immediately if symptoms worsen or persist or new concerns arise.  Patient Instructions  BEFORE YOU LEAVE: -xray sheet -labs -follow up: Medicare exam with Manuela Schwartz at earliest convenience; follow up Dr. Maudie Mercury 3 months  Take the antibiotic.  Get the chest xray.  Go to the hospital if you are feeling any worse or you are not felling better    Colin Benton R., DO

## 2016-09-13 ENCOUNTER — Encounter: Payer: Self-pay | Admitting: Radiology

## 2016-09-13 ENCOUNTER — Ambulatory Visit (INDEPENDENT_AMBULATORY_CARE_PROVIDER_SITE_OTHER)
Admission: RE | Admit: 2016-09-13 | Discharge: 2016-09-13 | Disposition: A | Discharge status: Home / Self Care | Payer: Medicare HMO | Source: Ambulatory Visit | Attending: Family Medicine | Admitting: Family Medicine

## 2016-09-13 DIAGNOSIS — R059 Cough, unspecified: Secondary | ICD-10-CM

## 2016-09-13 DIAGNOSIS — R05 Cough: Secondary | ICD-10-CM | POA: Diagnosis not present

## 2016-09-13 DIAGNOSIS — R0602 Shortness of breath: Secondary | ICD-10-CM | POA: Diagnosis not present

## 2016-10-19 DIAGNOSIS — I081 Rheumatic disorders of both mitral and tricuspid valves: Secondary | ICD-10-CM | POA: Diagnosis not present

## 2016-10-19 DIAGNOSIS — I10 Essential (primary) hypertension: Secondary | ICD-10-CM | POA: Diagnosis not present

## 2016-10-19 DIAGNOSIS — I2510 Atherosclerotic heart disease of native coronary artery without angina pectoris: Secondary | ICD-10-CM | POA: Diagnosis not present

## 2016-10-19 DIAGNOSIS — I5022 Chronic systolic (congestive) heart failure: Secondary | ICD-10-CM | POA: Diagnosis not present

## 2016-10-19 DIAGNOSIS — Z6841 Body Mass Index (BMI) 40.0 and over, adult: Secondary | ICD-10-CM | POA: Diagnosis not present

## 2016-10-19 DIAGNOSIS — E785 Hyperlipidemia, unspecified: Secondary | ICD-10-CM | POA: Diagnosis not present

## 2016-10-19 DIAGNOSIS — R0602 Shortness of breath: Secondary | ICD-10-CM | POA: Diagnosis not present

## 2016-10-19 DIAGNOSIS — I517 Cardiomegaly: Secondary | ICD-10-CM | POA: Diagnosis not present

## 2016-10-19 DIAGNOSIS — I5189 Other ill-defined heart diseases: Secondary | ICD-10-CM | POA: Diagnosis not present

## 2016-10-19 DIAGNOSIS — I255 Ischemic cardiomyopathy: Secondary | ICD-10-CM | POA: Diagnosis not present

## 2016-10-24 ENCOUNTER — Telehealth: Payer: Self-pay | Admitting: Endocrinology

## 2016-10-24 MED ORDER — INSULIN GLARGINE 100 UNIT/ML SOLOSTAR PEN: PEN_INJECTOR | SUBCUTANEOUS | 1 refills | Status: DC

## 2016-10-24 NOTE — Telephone Encounter (Signed)
Refill submitted. 

## 2016-10-24 NOTE — Telephone Encounter (Signed)
Pt needs One Month of her Lantus Pens sent to the [facility] on [address].

## 2016-10-28 ENCOUNTER — Ambulatory Visit (INDEPENDENT_AMBULATORY_CARE_PROVIDER_SITE_OTHER): Payer: Medicare HMO | Admitting: Endocrinology

## 2016-10-28 ENCOUNTER — Encounter: Payer: Self-pay | Admitting: Endocrinology

## 2016-10-28 VITALS — BP 148/90 | HR 70

## 2016-10-28 DIAGNOSIS — E876 Hypokalemia: Secondary | ICD-10-CM | POA: Diagnosis not present

## 2016-10-28 DIAGNOSIS — E1165 Type 2 diabetes mellitus with hyperglycemia: Secondary | ICD-10-CM

## 2016-10-28 DIAGNOSIS — Z794 Long term (current) use of insulin: Secondary | ICD-10-CM

## 2016-10-28 LAB — URINALYSIS, ROUTINE W REFLEX MICROSCOPIC
Bilirubin Urine: NEGATIVE
HGB URINE DIPSTICK: NEGATIVE
Ketones, ur: NEGATIVE
Leukocytes, UA: NEGATIVE
NITRITE: NEGATIVE
SPECIFIC GRAVITY, URINE: 1.01 (ref 1.000–1.030)
TOTAL PROTEIN, URINE-UPE24: NEGATIVE
Urine Glucose: NEGATIVE
Urobilinogen, UA: 0.2 (ref 0.0–1.0)
WBC, UA: NONE SEEN (ref 0–?)
pH: 7 (ref 5.0–8.0)

## 2016-10-28 LAB — BASIC METABOLIC PANEL
BUN: 28 mg/dL — AB (ref 6–23)
CO2: 39 meq/L — AB (ref 19–32)
CREATININE: 1.28 mg/dL — AB (ref 0.40–1.20)
Calcium: 9.9 mg/dL (ref 8.4–10.5)
Chloride: 90 mEq/L — ABNORMAL LOW (ref 96–112)
GFR: 46.96 mL/min — AB (ref >60.00)
Glucose, Bld: 83 mg/dL (ref 70–99)
Potassium: 2.6 mEq/L — CL (ref 3.5–5.1)
Sodium: 139 mEq/L (ref 135–145)

## 2016-10-28 LAB — LIPID PANEL
Cholesterol: 144 mg/dL (ref 0–200)
HDL: 37 mg/dL — AB (ref >39.00)
LDL CALC: 92 mg/dL (ref 0–99)
NONHDL: 106.52
Total CHOL/HDL Ratio: 4
Triglycerides: 72 mg/dL (ref 0.0–149.0)
VLDL: 14.4 mg/dL (ref 0.0–40.0)

## 2016-10-28 LAB — MICROALBUMIN / CREATININE URINE RATIO
CREATININE, U: 22.9 mg/dL
MICROALB UR: 4.8 mg/dL — AB (ref 0.0–1.9)
MICROALB/CREAT RATIO: 21 mg/g (ref 0.0–30.0)

## 2016-10-28 LAB — GLUCOSE, POCT (MANUAL RESULT ENTRY): POC GLUCOSE: 79 mg/dL (ref 70–99)

## 2016-10-28 MED ORDER — POTASSIUM CHLORIDE CRYS ER 20 MEQ PO TBCR: 20.0000 meq | EXTENDED_RELEASE_TABLET | Freq: Three times a day (TID) | ORAL | 0 refills | Status: DC

## 2016-10-28 MED ORDER — INSULIN DEGLUDEC 100 UNIT/ML ~~LOC~~ SOPN: 40.0000 [IU] | PEN_INJECTOR | Freq: Every day | SUBCUTANEOUS | 1 refills | Status: DC

## 2016-10-28 NOTE — Progress Notes (Signed)
Patient ID: Sue Martin, female   DOB: [birth year], 51 y.o.   MRN: [id number]           Reason for Appointment: Follow-up for Type 2 Diabetes   History of Present Illness:          Date of diagnosis of type 2 diabetes mellitus: ?  2007        Recent history:   INSULIN regimen is described as: 65-45 units of Lantus bid; Novolog 35 units with meals       A1c in 11/17 was 10.1 and it is more improved at 8.7 recently done by PCP in March  She is still noncompliant with her follow-up and is coming here today because of running out of her Lantus  Current blood sugar patterns and problems identified:  She has still relatively poor control of her diabetes   She has however taken her metformin and is taking it 3 times a day even though she thinks it makes her nauseated  More recently because of running out of Lantus she has started eating only one meal a day and not eating any breakfast or lunch  She did not bring her monitor as directed again  He reports that her fasting blood sugars have been variable between 100-200 mostly and they can be periodically higher later in the day  However her glucose was 327 on her lab work done by PCP in March  She is trying to take her NovoLog 35 units with meals and may also take it at other times when the blood sugar is high  However surprisingly today in the office her blood sugars only 71 without any food today and not taking the Lantus, she says she had a very light meal last evening   Side effects with medications: vomiting with Trulicity  Compliance with the medical regimen: Fair  Hypoglycemia: None  Glucose monitoring:  done occasionally        Glucometer: Accu-Chek .       Blood Glucose readings by recall: As above  Self-care:  Meal times: Breakfast: 10 am Lunch: 3-4 pm,    Dinner 8 pm  Typical meal intake: Breakfast is usually a granola bar or light yogurt, lunch may be sandwich or just light snack and soups, dinner is usually  yogurt or other snacks like fruit or sugar-free cookies               Dietician visit, most recent: none, only in the hospital               Exercise:  unable to do any  Weight history: Around the time of diagnosis her weight was 350  Wt Readings from Last 3 Encounters:  07/07/16 250 lb (113.4 kg)  11/17/15 226 lb 15.8 oz (103 kg)  02/20/15 226 lb 15.8 oz (103 kg)    Glycemic control:   Lab Results  Component Value Date   HGBA1C 8.7 (H) 09/12/2016   HGBA1C 10.1 05/02/2016   HGBA1C 10.4 12/11/2015   Lab Results  Component Value Date   MICROALBUR 4.8 (H) 10/28/2016   LDLCALC 92 10/28/2016   CREATININE 1.28 (H) 10/28/2016    Other active problems: See review of systems   Allergies as of 10/28/2016      Reactions   Broccoli [brassica Oleracea Italica] Anaphylaxis   Influenza Vaccines Anaphylaxis   Per patient   Pneumococcal Vaccines Anaphylaxis   Per patient   Trulicity [dulaglutide] Hives, Nausea And Vomiting   Iron Other (  See Comments)   GI intolerance   Latex Other (See Comments)   blisters   Lisinopril Nausea Only   Penicillins Other (See Comments)   GI intolerance, UTI, can tolerate in IV Tolerates cephalosporins   Tape Other (See Comments)   Tears skin.  Please use "paper" tape   Codeine Nausea And Vomiting, Rash   Sulfa Antibiotics Nausea And Vomiting, Rash   Sulfur Nausea And Vomiting, Rash      Medication List       Accurate as of 10/28/16 11:59 PM. Always use your most recent med list.          ACCU-CHEK AVIVA PLUS w/Device Kit Use to check blood sugar 3 times a day Dx Code E11.9   aspirin 325 MG tablet Take 325 mg by mouth at bedtime.   atorvastatin 20 MG tablet Commonly known as:  LIPITOR Take 1 tablet (20 mg total) by mouth daily.   cyclobenzaprine 10 MG tablet Commonly known as:  FLEXERIL Take 10 mg by mouth 2 (two) times daily as needed for muscle spasms.   diphenhydrAMINE 25 MG tablet Commonly known as:  BENADRYL Take 50 mg by  mouth 2 (two) times daily as needed for allergies.   glucose blood test strip Commonly known as:  ACCU-CHEK AVIVA Use as instructed 3 times a day to check blood sugar. Dx code E11.9   hydrALAZINE 10 MG tablet Commonly known as:  APRESOLINE Take 1 tablet (10 mg total) by mouth 3 (three) times daily.   ibuprofen 200 MG tablet Commonly known as:  ADVIL,MOTRIN Take 200 mg by mouth daily as needed for moderate pain.   insulin aspart 100 UNIT/ML FlexPen Commonly known as:  NOVOLOG Inject 85 Units into the skin 3 (three) times daily with meals. Inject 25 units with Breakfast and Lunch, 35 units with dinner   insulin degludec 100 UNIT/ML Sopn FlexTouch Pen Commonly known as:  TRESIBA FLEXTOUCH Inject 0.4 mLs (40 Units total) into the skin daily with breakfast.   isosorbide mononitrate 30 MG 24 hr tablet Commonly known as:  IMDUR Take 30 mg by mouth 3 (three) times daily.   lisinopril 10 MG tablet Commonly known as:  PRINIVIL,ZESTRIL Take 10 mg by mouth at bedtime.   metFORMIN 500 MG 24 hr tablet Commonly known as:  GLUCOPHAGE-XR Take 4 tablets (2,000 mg total) by mouth daily with supper.   metolazone 5 MG tablet Commonly known as:  ZAROXOLYN TAKE ONE TABLET BY MOUTH ONCE DAILY (30  MINUTES  BEFORE  DEMADEX  DOSE)   potassium chloride SA 20 MEQ tablet Commonly known as:  K-DUR,KLOR-CON Take 1 tablet (20 mEq total) by mouth 3 (three) times daily.   spironolactone 25 MG tablet Commonly known as:  ALDACTONE Take 1 tablet (25 mg total) by mouth daily.   torsemide 20 MG tablet Commonly known as:  DEMADEX TAKE ONE TABLET BY MOUTH TWICE DAILY       Allergies:  Allergies  Allergen Reactions  . Broccoli [Brassica Oleracea Italica] Anaphylaxis  . Influenza Vaccines Anaphylaxis    Per patient  . Pneumococcal Vaccines Anaphylaxis    Per patient  . Trulicity [Dulaglutide] Hives and Nausea And Vomiting  . Iron Other (See Comments)    GI intolerance  . Latex Other (See  Comments)    blisters  . Lisinopril Nausea Only  . Penicillins Other (See Comments)    GI intolerance, UTI, can tolerate in IV Tolerates cephalosporins  . Tape Other (See Comments)    Tears  skin.  Please use "paper" tape  . Codeine Nausea And Vomiting and Rash  . Sulfa Antibiotics Nausea And Vomiting and Rash  . Sulfur Nausea And Vomiting and Rash    Past Medical History:  Diagnosis Date  . CAD (coronary artery disease) 12/12/2014   -s/p 2V PCI of the LAD/RCA with taxus stents -cardiologist is Dr. Carlis Abbott, [facility]   . CHF (congestive heart failure) ([place])    sees Dr. Novella Rob  . Chronic kidney disease   . Depression   . Diabetes mellitus with renal complications (HCC)    PVD and retinopathy, S/p bilat ambutations  . Glaucoma   . Liver cirrhosis ([place])   . Lyme disease   . Retinal detachment    sees Dr. Rose Phi per her report  . S/P bilateral BKA (below knee amputation) ([place]) 11/28/2014    Past Surgical History:  Procedure Laterality Date  . ESOPHAGOGASTRODUODENOSCOPY (EGD) WITH PROPOFOL N/A 07/07/2016   Procedure: ESOPHAGOGASTRODUODENOSCOPY (EGD) WITH PROPOFOL;  Surgeon: Milus Banister, MD;  Location: [facility];  Service: Endoscopy;  Laterality: N/A;  . LASIK    . LEG AMPUTATION BELOW KNEE  09/09/2011, 09/11/2011  . TUBAL LIGATION      Family History  Problem Relation Age of Onset  . Arthritis Mother   . Heart disease Mother   . Mental illness Mother   . Diabetes Mother   . Arthritis Maternal Grandmother   . Diabetes Maternal Grandmother   . Arthritis Father   . CVA Father   . Hypertension Father   . Sudden death Paternal Grandfather     Social History:  reports that she has never smoked. She has never used smokeless tobacco. She reports that she does not drink alcohol or use drugs.    Review of Systems    Lipid history: On Lipitor for the last few years, taking 20 mg, Not clear if this is being followed by PCP or cardiologist   Lab Results  Component  Value Date   CHOL 144 10/28/2016   HDL 37.00 (L) 10/28/2016   LDLCALC 92 10/28/2016   TRIG 72.0 10/28/2016   CHOLHDL 4 10/28/2016           Eyes:  history of significant Visual loss  since about 2005.    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  However she is not taking her Lantus, not checking blood sugars regularly, eating inconsistently and difficult to assess her level of control today  PLAN:   Start metformin ER Again and try only 1000 mg a day in split doses first, if tolerated she can go up to 1500 mg after at least 1 week and take it with food consistently.  Switch Lantus to [place] as discussed in be taken once a day instead of twice a day Lantus  as before and discussed the differences  She will start with 15 units but may need to increase the dose based on fasting blood sugars every week  She will continue the NovoLog, 30-35 units per meal based on her meal size  Needs regular follow-up  More consistent diet, consider follow-up with diabetes educator and dietitian both, however she has some issues with transportation  Discussed blood sugar targets and need to check both fasting and postprandial readings consistently which she is not doing  Patient Instructions    Check blood sugars on waking up  4x weekly  Also check blood sugars about 2-3 hours after a meal and do this after different meals by rotation  Recommended blood sugar levels on waking up is 90-130 and about 2 hours after meal is 130-160  Please bring your blood sugar monitor to each visit, thank you  May reduce metformin to 2 tablets a day with your main meals  TRESIBA: This will replace the Lantus insulin once a day in the morning start with 15 units  NOVOLOG: Take 35 units with your main meals and 30 units for smaller meals If the blood sugars are running over 160 after any specific meal may need to increase the dose by 4-5 units       Yury Schaus 10/29/2016, 9:35 PM   Note: This office note was prepared with Dragon voice recognition system technology. Any transcriptional errors that result from this process are unintentional.  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  Message left on patient's voice mail

## 2016-10-28 NOTE — Patient Instructions (Addendum)
Check blood sugars on waking up  4x weekly  Also check blood sugars about 2-3 hours after a meal and do this after different meals by rotation  Recommended blood sugar levels on waking up is 90-130 and about 2 hours after meal is 130-160  Please bring your blood sugar monitor to each visit, thank you  May reduce metformin to 2 tablets a day with your main meals  TRESIBA: This will replace the Lantus insulin once a day in the morning start with 15 units  NOVOLOG: Take 35 units with your main meals and 30 units for smaller meals If the blood sugars are running over 160 after any specific meal may need to increase the dose by 4-5 units

## 2016-10-29 LAB — FRUCTOSAMINE: FRUCTOSAMINE: 367 umol/L — AB (ref 0–285)

## 2016-11-14 ENCOUNTER — Other Ambulatory Visit: Payer: Self-pay | Admitting: Endocrinology

## 2016-11-14 NOTE — Telephone Encounter (Signed)
Patient needs the newest medication dosage changed (increased). She has been testing however, blood sugar has been 200-260. Please call and advise patient.

## 2016-11-15 NOTE — Telephone Encounter (Signed)
Please get blood sugar readings by time of the day for the last 3 days on all patients who require insulin adjustment If she is referring to [place], she can increase the dose from 15 up to 30 units once a day

## 2016-11-15 NOTE — Telephone Encounter (Signed)
Please advise on dosage

## 2016-11-16 NOTE — Telephone Encounter (Signed)
Called patient and left a voice message to please call back to go over message from Dr. Lucianne MussKumar.

## 2016-11-17 ENCOUNTER — Ambulatory Visit (INDEPENDENT_AMBULATORY_CARE_PROVIDER_SITE_OTHER): Payer: Medicare HMO | Admitting: Family Medicine

## 2016-11-17 ENCOUNTER — Encounter: Payer: Self-pay | Admitting: Family Medicine

## 2016-11-17 VITALS — BP 96/80 | HR 77 | Temp 98.3°F | Ht <= 58 in

## 2016-11-17 DIAGNOSIS — J0100 Acute maxillary sinusitis, unspecified: Secondary | ICD-10-CM

## 2016-11-17 DIAGNOSIS — I255 Ischemic cardiomyopathy: Secondary | ICD-10-CM | POA: Diagnosis not present

## 2016-11-17 DIAGNOSIS — M62838 Other muscle spasm: Secondary | ICD-10-CM | POA: Diagnosis not present

## 2016-11-17 DIAGNOSIS — Z89512 Acquired absence of left leg below knee: Secondary | ICD-10-CM

## 2016-11-17 DIAGNOSIS — N183 Chronic kidney disease, stage 3 unspecified: Secondary | ICD-10-CM

## 2016-11-17 DIAGNOSIS — Z794 Long term (current) use of insulin: Secondary | ICD-10-CM | POA: Diagnosis not present

## 2016-11-17 DIAGNOSIS — E1121 Type 2 diabetes mellitus with diabetic nephropathy: Secondary | ICD-10-CM

## 2016-11-17 DIAGNOSIS — E876 Hypokalemia: Secondary | ICD-10-CM

## 2016-11-17 DIAGNOSIS — I5022 Chronic systolic (congestive) heart failure: Secondary | ICD-10-CM

## 2016-11-17 DIAGNOSIS — Z89511 Acquired absence of right leg below knee: Secondary | ICD-10-CM | POA: Diagnosis not present

## 2016-11-17 LAB — POTASSIUM: POTASSIUM: 3 meq/L — AB (ref 3.5–5.1)

## 2016-11-17 MED ORDER — DOXYCYCLINE HYCLATE 100 MG PO TABS: 100.0000 mg | ORAL_TABLET | Freq: Two times a day (BID) | ORAL | 0 refills | Status: DC

## 2016-11-17 MED ORDER — CYCLOBENZAPRINE HCL 5 MG PO TABS: 5.0000 mg | ORAL_TABLET | Freq: Two times a day (BID) | ORAL | 1 refills | Status: AC | PRN

## 2016-11-17 MED ORDER — BENZONATATE 100 MG PO CAPS: 100.0000 mg | ORAL_CAPSULE | Freq: Three times a day (TID) | ORAL | 0 refills | Status: DC | PRN

## 2016-11-17 NOTE — Patient Instructions (Signed)
BEFORE YOU LEAVE: -labs -AWV with Darl PikesSusan -follow up: as scheduled with Dr. Selena BattenKim  Start the antibiotic - doxycycline and take as instrutced. Do not get in the sun on this medication.  Tessalon for cough as needed.  Checking potassium and will forward to your kidney specialist if abnormal per your request.  I hope you are feeling better soon! Seek care immediately if worsening, new concerns or you are not improving with treatment.

## 2016-11-17 NOTE — Progress Notes (Signed)
HPI:   Acute visit for:  URI: -started: 4 weeks ago, worsening the last 1 week -symptoms:nasal congestion, sore throat, cough - now with some L maxillary sinus pain, green nasal congestion -denies:fever, SOB, NVD, tooth pain -has tried: nothing, does not tolerate nasal sprays -sick contacts/travel/risks: no reported flu, strep or tick exposure -Hx of: allergies  Hypokalemia: -requests that we check potassium and fax to her nephrologist -reports her nephrologist is handling this and finally has found a liquid potassium she tolerates which she is taking  -long hx low potassium on diuretics  Muscle spasm/OA - knees and back: -requesting refill flexeril, reports her heart doctor said this was ok and was sending me notes -on this intermittently in the past -bilat amputee, in scooter  ROS: See pertinent positives and negatives per HPI.  Past Medical History:  Diagnosis Date  . CAD (coronary artery disease) 12/12/2014   -s/p 2V PCI of the LAD/RCA with taxus stents -cardiologist is Dr. Carlis Abbott, [facility]   . CHF (congestive heart failure) ([place])    sees Dr. Novella Rob  . Chronic kidney disease   . Depression   . Diabetes mellitus with renal complications (HCC)    PVD and retinopathy, S/p bilat ambutations  . Glaucoma   . Liver cirrhosis ([place])   . Lyme disease   . Retinal detachment    sees Dr. Rose Phi per her report  . S/P bilateral BKA (below knee amputation) ([place]) 11/28/2014    Past Surgical History:  Procedure Laterality Date  . ESOPHAGOGASTRODUODENOSCOPY (EGD) WITH PROPOFOL N/A 07/07/2016   Procedure: ESOPHAGOGASTRODUODENOSCOPY (EGD) WITH PROPOFOL;  Surgeon: Milus Banister, MD;  Location: [facility];  Service: Endoscopy;  Laterality: N/A;  . LASIK    . LEG AMPUTATION BELOW KNEE  09/09/2011, 09/11/2011  . TUBAL LIGATION      Family History  Problem Relation Age of Onset  . Arthritis Mother   . Heart disease Mother   . Mental illness Mother   . Diabetes Mother   .  Arthritis Maternal Grandmother   . Diabetes Maternal Grandmother   . Arthritis Father   . CVA Father   . Hypertension Father   . Sudden death Paternal Grandfather     Social History   Social History  . Marital status: Married    Spouse name: N/A  . Number of children: N/A  . Years of education: N/A   Social History Main Topics  . Smoking status: Never Smoker  . Smokeless tobacco: Never Used  . Alcohol use No  . Drug use: No  . Sexual activity: Not Asked   Other Topics Concern  . None   Social History Narrative   Married   Husband is a Administrator        Current Outpatient Prescriptions:  .  aspirin 325 MG tablet, Take 325 mg by mouth at bedtime. , Disp: , Rfl:  .  atorvastatin (LIPITOR) 20 MG tablet, Take 1 tablet (20 mg total) by mouth daily. (Patient taking differently: Take 20 mg by mouth every evening. ), Disp: 90 tablet, Rfl: 1 .  Blood Glucose Monitoring Suppl (ACCU-CHEK AVIVA PLUS) w/Device KIT, Use to check blood sugar 3 times a day Dx Code E11.9, Disp: 1 kit, Rfl: 0 .  diphenhydrAMINE (BENADRYL) 25 MG tablet, Take 50 mg by mouth 2 (two) times daily as needed for allergies., Disp: , Rfl:  .  glucose blood (ACCU-CHEK AVIVA) test strip, Use as instructed 3 times a day to check blood sugar. Dx  code E11.9, Disp: 100 each, Rfl: 5 .  ibuprofen (ADVIL,MOTRIN) 200 MG tablet, Take 200 mg by mouth daily as needed for moderate pain. , Disp: , Rfl:  .  insulin aspart (NOVOLOG) 100 UNIT/ML FlexPen, Inject 85 Units into the skin 3 (three) times daily with meals. Inject 25 units with Breakfast and Lunch, 35 units with dinner (Patient taking differently: Inject 35 Units into the skin 3 (three) times daily with meals. ), Disp: 30 mL, Rfl: 11 .  insulin degludec (TRESIBA FLEXTOUCH) 100 UNIT/ML SOPN FlexTouch Pen, Inject 0.4 mLs (40 Units total) into the skin daily with breakfast. (Patient taking differently: Inject 15 Units into the skin daily with breakfast. ), Disp: 5 pen, Rfl: 1 .   metFORMIN (GLUCOPHAGE-XR) 500 MG 24 hr tablet, Take 4 tablets (2,000 mg total) by mouth daily with supper. (Patient taking differently: Take 500 mg by mouth 4 (four) times daily. ), Disp: 120 tablet, Rfl: 3 .  metolazone (ZAROXOLYN) 5 MG tablet, TAKE ONE TABLET BY MOUTH ONCE DAILY (30  MINUTES  BEFORE  DEMADEX  DOSE) (Patient taking differently: Take 57ms at night), Disp: 30 tablet, Rfl: 11 .  potassium chloride 20 MEQ/15ML (10%) SOLN, , Disp: , Rfl:  .  spironolactone (ALDACTONE) 25 MG tablet, Take 1 tablet (25 mg total) by mouth daily. (Patient taking differently: Take 25 mg by mouth at bedtime. ), Disp: 90 tablet, Rfl: 3 .  torsemide (DEMADEX) 20 MG tablet, TAKE ONE TABLET BY MOUTH TWICE DAILY, Disp: 60 tablet, Rfl: 5 .  benzonatate (TESSALON PERLES) 100 MG capsule, Take 1 capsule (100 mg total) by mouth 3 (three) times daily as needed for cough., Disp: 20 capsule, Rfl: 0 .  cyclobenzaprine (FLEXERIL) 5 MG tablet, Take 1 tablet (5 mg total) by mouth 2 (two) times daily as needed for muscle spasms., Disp: 30 tablet, Rfl: 1 .  doxycycline (VIBRA-TABS) 100 MG tablet, Take 1 tablet (100 mg total) by mouth 2 (two) times daily., Disp: 14 tablet, Rfl: 0 .  hydrALAZINE (APRESOLINE) 10 MG tablet, Take 1 tablet (10 mg total) by mouth 3 (three) times daily., Disp: 270 tablet, Rfl: 3 .  isosorbide mononitrate (IMDUR) 30 MG 24 hr tablet, Take 30 mg by mouth 3 (three) times daily. , Disp: , Rfl:  .  lisinopril (PRINIVIL,ZESTRIL) 10 MG tablet, Take 10 mg by mouth at bedtime. , Disp: , Rfl:   EXAM:  Vitals:   11/17/16 1257  BP: 96/80  Pulse: 77  Temp: 98.3 F (36.8 C)    There is no height or weight on file to calculate BMI.  GENERAL: vitals reviewed and listed above, alert, oriented, appears well hydrated and in no acute distress  HEENT: atraumatic, conjunttiva clear, no obvious abnormalities on inspection of external nose and ears, normal appearance of ear canals and TMs, thick nasal congestion, mild  post oropharyngeal erythema with PND, no tonsillar edema or exudate, no sinus TTP  NECK: no obvious masses on inspection  LUNGS: clear to auscultation bilaterally, no wheezes, rales or rhonchi, good air movement  CV: HRRR, no peripheral edema  MS: in wheelchair, bilat amputee  PSYCH: pleasant and cooperative, no obvious depression or anxiety  ASSESSMENT AND PLAN:  Discussed the following assessment and plan:  Acute non-recurrent maxillary sinusitis -opted for doxy for likely bacterial sinusitis at this point, risks and use discussed, return precautions  Hypokalemia - Plan: Potassium -will check and forward to nephrologist per her request  Muscle spasm S/P bilateral BKA (below knee amputation) [place] -  refill flexeril for prn use, limited  Chronic systolic congestive heart failure ([place]) -sees cardiology, on diuretics, glad has found a solution to help with the low potassium  Type 2 diabetes mellitus with diabetic nephropathy, with long-term current use of insulin (HCC) CKD (chronic kidney disease) stage 3, GFR 30-59 ml/min -sees specialists for management  -given HPI and exam findings today, a serious infection or illness is unlikely. We discussed potential etiologies, with VURI being most likely, and advised supportive care and monitoring. We discussed treatment side effects, likely course, antibiotic misuse, transmission, and signs of developing a serious illness. -of course, we advised to return or notify a doctor immediately if symptoms worsen or persist or new concerns arise.    Patient Instructions  BEFORE YOU LEAVE: -labs -AWV with Manuela Schwartz -follow up: as scheduled with Dr. Maudie Mercury  Start the antibiotic - doxycycline and take as instrutced. Do not get in the sun on this medication.  Tessalon for cough as needed.  Checking potassium and will forward to your kidney specialist if abnormal per your request.  I hope you are feeling better soon! Seek care immediately if  worsening, new concerns or you are not improving with treatment.       Colin Benton R., DO

## 2016-11-18 NOTE — Telephone Encounter (Signed)
Patient returning phone call. Please call back.

## 2016-11-22 NOTE — Telephone Encounter (Signed)
Patient called back. Okay to leave a detailed message on phone.

## 2016-11-30 MED ORDER — INSULIN ASPART 100 UNIT/ML FLEXPEN: 35.0000 [IU] | PEN_INJECTOR | Freq: Three times a day (TID) | SUBCUTANEOUS | 3 refills | Status: DC

## 2016-11-30 NOTE — Telephone Encounter (Signed)
Called patient and left a voice message to increase her T[place]iba from 15 units to 30 units daily per Dr. Remus BlakeKumar's note. Also asked for her to call us back and give us her blood sugar readings for the past 3 days and the time of day the blood sugar tests were done with the reading.

## 2016-12-09 NOTE — Progress Notes (Signed)
Subjective:   Sue Martin is a 51 y.o. female who presents for an Initial Medicare Annual Wellness Visit.  The Patient was informed that the wellness visit is to identify future health risk and educate and initiate measures that can reduce risk for increased disease through the lifespan.   Review of Systems    No ROS.  Medicare Wellness Visit. Additional risk factors are reflected in the social history.    Sleep patterns: Goes from too much sleep to not sleeping at all. Home Safety/Smoke Alarms: Feels safe in home. Smoke alarms in place.  Living environment; residence and Firearm Safety: Pt lives with youngest son and best friend. States that she has help 24 hrs/day and 7 days/week.  Seat Belt Safety/Bike Helmet: Wears seat belt.   Counseling:   Eye Exam- Legally blind. States eye doctor told her she does not need anymore eye exams.  Dental- Does not go. Has no teeth and does not wear dentures due to gag reflex.   Female:   Pap-  Last one was 5 years ago. Referral placed for gynecology.      Mammo- Not on file.             Objective:    Today's Vitals   12/13/16 1300  BP: 112/78  Pulse: 88  Temp: 98.5 F (36.9 C)  TempSrc: Oral  SpO2: 91%   There is no height or weight on file to calculate BMI.   Current Medications (verified) Outpatient Encounter Prescriptions as of 12/13/2016  Medication Sig  . aspirin 325 MG tablet Take 325 mg by mouth at bedtime.   Marland Kitchen atorvastatin (LIPITOR) 20 MG tablet Take 1 tablet (20 mg total) by mouth daily. (Patient taking differently: Take 20 mg by mouth every evening. )  . benzonatate (TESSALON PERLES) 100 MG capsule Take 1 capsule (100 mg total) by mouth 3 (three) times daily as needed for cough.  . Blood Glucose Monitoring Suppl (ACCU-CHEK AVIVA PLUS) w/Device KIT Use to check blood sugar 3 times a day Dx Code E11.9  . cyclobenzaprine (FLEXERIL) 5 MG tablet Take 1 tablet (5 mg total) by mouth 2 (two) times daily as needed for muscle  spasms.  . diphenhydrAMINE (BENADRYL) 25 MG tablet Take 50 mg by mouth 2 (two) times daily as needed for allergies.  Marland Kitchen glucose blood (ACCU-CHEK AVIVA) test strip Use as instructed 3 times a day to check blood sugar. Dx code E11.9  . hydrALAZINE (APRESOLINE) 10 MG tablet Take 1 tablet (10 mg total) by mouth 3 (three) times daily.  Marland Kitchen ibuprofen (ADVIL,MOTRIN) 200 MG tablet Take 200 mg by mouth daily as needed for moderate pain.   Marland Kitchen insulin aspart (NOVOLOG) 100 UNIT/ML FlexPen Inject 35 Units into the skin 3 (three) times daily with meals.  . insulin degludec (TRESIBA FLEXTOUCH) 100 UNIT/ML SOPN FlexTouch Pen Inject 0.4 mLs (40 Units total) into the skin daily with breakfast. (Patient taking differently: Inject 30 Units into the skin daily with breakfast. )  . isosorbide mononitrate (IMDUR) 30 MG 24 hr tablet Take 30 mg by mouth 3 (three) times daily.   Marland Kitchen lisinopril (PRINIVIL,ZESTRIL) 10 MG tablet Take 10 mg by mouth at bedtime.   . metFORMIN (GLUCOPHAGE-XR) 500 MG 24 hr tablet Take 4 tablets (2,000 mg total) by mouth daily with supper. (Patient taking differently: Take 500 mg by mouth 4 (four) times daily. )  . metolazone (ZAROXOLYN) 5 MG tablet TAKE ONE TABLET BY MOUTH ONCE DAILY (30  MINUTES  BEFORE  DEMADEX  DOSE) (Patient taking differently: Take 28ms at night)  . potassium chloride 20 MEQ/15ML (10%) SOLN 60 mEq 2 (two) times daily.   .Marland Kitchenspironolactone (ALDACTONE) 25 MG tablet Take 1 tablet (25 mg total) by mouth daily. (Patient taking differently: Take 25 mg by mouth at bedtime. )  . torsemide (DEMADEX) 20 MG tablet TAKE ONE TABLET BY MOUTH TWICE DAILY  . [DISCONTINUED] doxycycline (VIBRA-TABS) 100 MG tablet Take 1 tablet (100 mg total) by mouth 2 (two) times daily.   No facility-administered encounter medications on file as of 12/13/2016.     Allergies (verified) Broccoli [brassica oleracea italica]; Influenza vaccines; Pneumococcal vaccines; Trulicity [dulaglutide]; Doxycycline; Iron; Latex;  Lisinopril; Penicillins; Tape; Codeine; Sulfa antibiotics; and Sulfur   History: Past Medical History:  Diagnosis Date  . CAD (coronary artery disease) 12/12/2014   -s/p 2V PCI of the LAD/RCA with taxus stents -cardiologist is Sue Martin UDarcel Martin  . CHF (congestive heart failure) ([place]    sees Dr. KNovella Martin . Chronic kidney disease   . Depression   . Diabetes mellitus with renal complications (HCC)    PVD and retinopathy, S/p bilat ambutations  . Glaucoma   . Liver cirrhosis ([place]   . Lyme disease   . Retinal detachment    sees Dr. PRose Martin her report  . S/P bilateral BKA (below knee amputation) ([place] 11/28/2014   Past Surgical History:  Procedure Laterality Date  . ESOPHAGOGASTRODUODENOSCOPY (EGD) WITH PROPOFOL N/A 07/07/2016   Procedure: ESOPHAGOGASTRODUODENOSCOPY (EGD) WITH PROPOFOL;  Surgeon: DMilus Banister MD;  Location: [facility];  Service: Endoscopy;  Laterality: N/A;  . LASIK    . LEG AMPUTATION BELOW KNEE  09/09/2011, 09/11/2011  . TUBAL LIGATION     Family History  Problem Relation Age of Onset  . Arthritis Mother   . Heart disease Mother   . Mental illness Mother   . Diabetes Mother   . Arthritis Maternal Grandmother   . Diabetes Maternal Grandmother   . Arthritis Father   . CVA Father   . Hypertension Father   . Sudden death Paternal Grandfather    Social History   Occupational History  . Not on file.   Social History Main Topics  . Smoking status: Never Smoker  . Smokeless tobacco: Never Used  . Alcohol use No  . Drug use: No  . Sexual activity: Not on file    Tobacco Counseling Counseling given: Not Answered   Activities of Daily Living In your present state of health, do you have any difficulty performing the following activities: 12/13/2016  Hearing? N  Vision? N  Difficulty concentrating or making decisions? N  Walking or climbing stairs? Y  Dressing or bathing? Y  Doing errands, shopping? Y  Preparing Food and eating ? N    Using the Toilet? N  In the past six months, have you accidently leaked urine? N  Do you have problems with loss of bowel control? N  Managing your Medications? N  Managing your Finances? N  Housekeeping or managing your Housekeeping? N  Some recent data might be hidden    Immunizations and Health Maintenance Immunization History  Administered Date(s) Administered  . Tdap 08/26/2011   Health Maintenance Due  Topic Date Due  . PAP SMEAR  06/27/2014    Patient Care Team: KLucretia Kern DO as PCP - General (Family Medicine) KHelene Kelp MD as Referring Physician (Cardiology) [facility] Services you may have received from  other than [facility] providers in the past year (date may be approximate).     Assessment:   This is a routine wellness examination for Sue Martin. Physical assessment deferred to PCP.  Hearing/Vision screen Hearing Screening Comments: Able to hear conversational tones w/o difficulty. No issues reported. Last hearing screen 20 years ago.  Vision Screening Comments: Legally blind. Does not go for vision screens anymore.  Dietary issues and exercise activities discussed: Current Exercise Habits: Home exercise routine, Type of exercise: stretching, Time (Minutes): 60, Frequency (Times/Week): 7, Weekly Exercise (Minutes/Week): 420, Intensity: Mild, Exercise limited by: orthopedic condition(s)  Diet (meal preparation, eat out, water intake, caffeinated beverages, dairy products, fruits and vegetables): Pt states she is eating "healthier."  Pt states that she eats 5 mini meals that consist of 2-3 carbs. Tries to avoid processed food but struggles with this.  Goals    . Maintain current health status      Depression Screen PHQ 2/9 Scores 12/13/2016  PHQ - 2 Score 5  PHQ- 9 Score 19    Fall Risk Fall Risk  12/13/2016  Falls in the past year? No    Cognitive Function:   Ad8 score reviewed for issues:  Issues making  decisions:no  Less interest in hobbies / activities:no  Repeats questions, stories (family complaining):no  Trouble using ordinary gadgets (microwave, computer, phone):no  Forgets the month or year: no  Mismanaging finances: no  Remembering appts:no Daily problems with thinking and/or memory:no Ad8 score is=0      Screening Tests Health Maintenance  Topic Date Due  . PAP SMEAR  06/27/2014  . OPHTHALMOLOGY EXAM  12/13/2016 (Originally 02/24/2016)  . HIV Screening  01/22/2025 (Originally 01/20/1982)  . PNEUMOCOCCAL POLYSACCHARIDE VACCINE (1) 04/01/2035 (Originally 01/20/1969)  . INFLUENZA VACCINE  04/01/2035 (Originally 01/25/2017)  . HEMOGLOBIN A1C  03/15/2017  . TETANUS/TDAP  08/25/2021      Plan:    Follow-up w/ PCP as directed.  I have personally reviewed and noted the following in the patient's chart:   . Medical and social history . Use of alcohol, tobacco or illicit drugs  . Current medications and supplements . Functional ability and status . Nutritional status . Physical activity . Advanced directives . List of other physicians . Vitals . Screenings to include cognitive, depression, and falls . Referrals and appointments  In addition, I have reviewed and discussed with patient certain preventive protocols, quality metrics, and best practice recommendations. A written personalized care plan for preventive services as well as general preventive health recommendations were provided to patient.     Ree Edman, RN   12/13/2016

## 2016-12-13 ENCOUNTER — Ambulatory Visit (INDEPENDENT_AMBULATORY_CARE_PROVIDER_SITE_OTHER): Payer: Medicare HMO | Admitting: Family Medicine

## 2016-12-13 ENCOUNTER — Encounter: Payer: Self-pay | Admitting: Family Medicine

## 2016-12-13 VITALS — BP 112/78 | HR 88 | Temp 98.5°F

## 2016-12-13 DIAGNOSIS — E1165 Type 2 diabetes mellitus with hyperglycemia: Secondary | ICD-10-CM

## 2016-12-13 DIAGNOSIS — N183 Chronic kidney disease, stage 3 unspecified: Secondary | ICD-10-CM

## 2016-12-13 DIAGNOSIS — F331 Major depressive disorder, recurrent, moderate: Secondary | ICD-10-CM

## 2016-12-13 DIAGNOSIS — E785 Hyperlipidemia, unspecified: Secondary | ICD-10-CM | POA: Diagnosis not present

## 2016-12-13 DIAGNOSIS — Z89511 Acquired absence of right leg below knee: Secondary | ICD-10-CM

## 2016-12-13 DIAGNOSIS — Z89512 Acquired absence of left leg below knee: Secondary | ICD-10-CM | POA: Diagnosis not present

## 2016-12-13 DIAGNOSIS — Z0000 Encounter for general adult medical examination without abnormal findings: Secondary | ICD-10-CM

## 2016-12-13 DIAGNOSIS — K7460 Unspecified cirrhosis of liver: Secondary | ICD-10-CM | POA: Diagnosis not present

## 2016-12-13 DIAGNOSIS — I739 Peripheral vascular disease, unspecified: Secondary | ICD-10-CM | POA: Diagnosis not present

## 2016-12-13 DIAGNOSIS — N950 Postmenopausal bleeding: Secondary | ICD-10-CM | POA: Diagnosis not present

## 2016-12-13 DIAGNOSIS — Z794 Long term (current) use of insulin: Secondary | ICD-10-CM

## 2016-12-13 DIAGNOSIS — E11319 Type 2 diabetes mellitus with unspecified diabetic retinopathy without macular edema: Secondary | ICD-10-CM | POA: Diagnosis not present

## 2016-12-13 DIAGNOSIS — I5022 Chronic systolic (congestive) heart failure: Secondary | ICD-10-CM | POA: Diagnosis not present

## 2016-12-13 NOTE — Patient Instructions (Signed)
BEFORE YOU LEAVE: -follow up: 3 months -wellness visit with health coach -call around to gyn offices - need hydraulic table for eval postmenopausal bleeding and pap -counseling brochure -contact information for [facility] -contact information for her gastroenterologist  Please call your gastroenterologist today for an appointment about your liver.  Call today to set up counseling appointment and psychiatry appointment.  Please seek emergency care and call 911 if severe depression or thoughts of hurting yourself or others.  Advise regular aerobic chair exercises (at least 150 minutes per week of sweaty exercise) and a healthy diet. Try to eat at least 5-9 servings of vegetables and fruits per day (not corn, potatoes or bananas.) Avoid sweets, red meat, pork, butter, fried foods, fast food, processed food, excessive dairy, eggs and coconut. Replace bad fats with good fats - fish, nuts and seeds, canola oil, olive oil.

## 2016-12-13 NOTE — Progress Notes (Signed)
HPI:  Seeing health coach today for AWV. Complex patient with unfortunately a long list of medical problems including CV disease, diabetes with complications, CKD, cirrhosis of the liver, obesity, s/p bilat LE amputation, legal blindness and depression here for follow up. She sees a number of specialist including cardiology, nephrology, endocrinology and opthomology. She was seen by GI a while back and was advised to follow up with them for further testing regarding her livers - we advised her to do this at her last visit, but she reports it slipped her mind. She agrees to call GI today to schedule appointment. New concerns are postmenopausal bleeding - had no period for 17 months then bled this month a significant amount. She has not had a pap in 5 years she reports. She has agreed to schedule when we advised it, refused he as insists on hydraulic table rather then lifting and we do not have this. She denies any abnormal paps in the past. Not bleeding currently. Also reports "my depression has returned". Wants referral to pscyhiatry and counselor. Reports on medications intermittently in the past. Symptoms include anhedonia, irritability and depressed mood. Denies hallucinations, SI or homicidal intentions. Admits sometimes wants to puch people when they upset her, but does not feel would do this and feels safe. Due for GI follow up, pap, UTD on labs managed by several specialists.  ROS: See pertinent positives and negatives per HPI.  Past Medical History:  Diagnosis Date  . CAD (coronary artery disease) 12/12/2014   -s/p 2V PCI of the LAD/RCA with taxus stents -cardiologist is Dr. Carlis Abbott, [facility]   . CHF (congestive heart failure) ([place])    sees Dr. Novella Rob  . Chronic kidney disease   . Depression   . Diabetes mellitus with renal complications (HCC)    PVD and retinopathy, S/p bilat ambutations  . Glaucoma   . Liver cirrhosis ([place])   . Lyme disease   . Retinal detachment    sees Dr. Rose Phi per her report  . S/P bilateral BKA (below knee amputation) ([place]) 11/28/2014    Past Surgical History:  Procedure Laterality Date  . ESOPHAGOGASTRODUODENOSCOPY (EGD) WITH PROPOFOL N/A 07/07/2016   Procedure: ESOPHAGOGASTRODUODENOSCOPY (EGD) WITH PROPOFOL;  Surgeon: Milus Banister, MD;  [facility];  Service: Endoscopy;  Laterality: N/A;  . LASIK    . LEG AMPUTATION BELOW KNEE  09/09/2011, 09/11/2011  . TUBAL LIGATION      Family History  Problem Relation Age of Onset  . Arthritis Mother   . Heart disease Mother   . Mental illness Mother   . Diabetes Mother   . Arthritis Maternal Grandmother   . Diabetes Maternal Grandmother   . Arthritis Father   . CVA Father   . Hypertension Father   . Sudden death Paternal Grandfather     Social History   Social History  . Marital status: Married    Spouse name: N/A  . Number of children: N/A  . Years of education: N/A   Social History Main Topics  . Smoking status: Never Smoker  . Smokeless tobacco: Never Used  . Alcohol use No  . Drug use: No  . Sexual activity: Not Asked   Other Topics Concern  . None   Social History Narrative   Married   Husband is a Administrator        Current Outpatient Prescriptions:  .  aspirin 325 MG tablet, Take 325 mg by mouth at bedtime. , Disp: , Rfl:  .  atorvastatin (LIPITOR) 20 MG tablet, Take 1 tablet (20 mg total) by mouth daily. (Patient taking differently: Take 20 mg by mouth every evening. ), Disp: 90 tablet, Rfl: 1 .  benzonatate (TESSALON PERLES) 100 MG capsule, Take 1 capsule (100 mg total) by mouth 3 (three) times daily as needed for cough., Disp: 20 capsule, Rfl: 0 .  Blood Glucose Monitoring Suppl (ACCU-CHEK AVIVA PLUS) w/Device KIT, Use to check blood sugar 3 times a day Dx Code E11.9, Disp: 1 kit, Rfl: 0 .  cyclobenzaprine (FLEXERIL) 5 MG tablet, Take 1 tablet (5 mg total) by mouth 2 (two) times daily as needed for muscle spasms., Disp: 30 tablet, Rfl: 1 .   diphenhydrAMINE (BENADRYL) 25 MG tablet, Take 50 mg by mouth 2 (two) times daily as needed for allergies., Disp: , Rfl:  .  glucose blood (ACCU-CHEK AVIVA) test strip, Use as instructed 3 times a day to check blood sugar. Dx code E11.9, Disp: 100 each, Rfl: 5 .  hydrALAZINE (APRESOLINE) 10 MG tablet, Take 1 tablet (10 mg total) by mouth 3 (three) times daily., Disp: 270 tablet, Rfl: 3 .  ibuprofen (ADVIL,MOTRIN) 200 MG tablet, Take 200 mg by mouth daily as needed for moderate pain. , Disp: , Rfl:  .  insulin aspart (NOVOLOG) 100 UNIT/ML FlexPen, Inject 35 Units into the skin 3 (three) times daily with meals., Disp: 30 mL, Rfl: 3 .  insulin degludec (TRESIBA FLEXTOUCH) 100 UNIT/ML SOPN FlexTouch Pen, Inject 0.4 mLs (40 Units total) into the skin daily with breakfast. (Patient taking differently: Inject 30 Units into the skin daily with breakfast. ), Disp: 5 pen, Rfl: 1 .  isosorbide mononitrate (IMDUR) 30 MG 24 hr tablet, Take 30 mg by mouth 3 (three) times daily. , Disp: , Rfl:  .  lisinopril (PRINIVIL,ZESTRIL) 10 MG tablet, Take 10 mg by mouth at bedtime. , Disp: , Rfl:  .  metFORMIN (GLUCOPHAGE-XR) 500 MG 24 hr tablet, Take 4 tablets (2,000 mg total) by mouth daily with supper. (Patient taking differently: Take 500 mg by mouth 4 (four) times daily. ), Disp: 120 tablet, Rfl: 3 .  metolazone (ZAROXOLYN) 5 MG tablet, TAKE ONE TABLET BY MOUTH ONCE DAILY (30  MINUTES  BEFORE  DEMADEX  DOSE) (Patient taking differently: Take 95ms at night), Disp: 30 tablet, Rfl: 11 .  potassium chloride 20 MEQ/15ML (10%) SOLN, 60 mEq 2 (two) times daily. , Disp: , Rfl:  .  spironolactone (ALDACTONE) 25 MG tablet, Take 1 tablet (25 mg total) by mouth daily. (Patient taking differently: Take 25 mg by mouth at bedtime. ), Disp: 90 tablet, Rfl: 3 .  torsemide (DEMADEX) 20 MG tablet, TAKE ONE TABLET BY MOUTH TWICE DAILY, Disp: 60 tablet, Rfl: 5  EXAM:  Vitals:   12/13/16 1300  BP: 112/78  Pulse: 88  Temp: 98.5 F (36.9  C)    There is no height or weight on file to calculate BMI.  GENERAL: vitals reviewed and listed above, alert, oriented, appears well hydrated and in no acute distress  HEENT: atraumatic, conjunttiva clear, no obvious abnormalities on inspection of external nose and ears  NECK: no obvious masses on inspection  LUNGS: clear to auscultation bilaterally, no wheezes, rales or rhonchi, good air movement  CV: HRRR, bilat mild stump edema  MS: scooter bound, bilat LE amputation  PSYCH: pleasant and cooperative, tearful when discussing depress  ASSESSMENT AND PLAN:  Discussed the following assessment and plan:  Moderate episode of recurrent major depressive disorder ([place] -assessed for  safety - she assures be would not harm self or others -resources for psychology/psychiatry provided -emergent options also discussed if any worsening or severe symptoms occur  Uncontrolled type 2 diabetes mellitus with retinopathy, with long-term current use of insulin, macular edema presence unspecified, unspecified laterality, unspecified retinopathy severity (HCC) -sees endo for management  CKD (chronic kidney disease) stage 3, GFR 30-59 ml/min -sees nephrology, reports recently upped potassium and has follow up for labs  Chronic systolic congestive heart failure ([place]) -sees cardiologist for management  Cirrhosis of liver without ascites, unspecified hepatic cirrhosis type ([place]) -needs follow up per GI imaging note - pt agreed to call today  S/P bilateral BKA (below knee amputation) ([place]) -stable, requires scooter  PVD (peripheral vascular disease) ([place]) -sees cardiologist  Hyperlipidemia, unspecified hyperlipidemia type -stable  Post menopausal bleeding: -advised gyn eval, have advised in the past for pap - she has not scheduled -advised assistant to coordinate with referral coordinator to set up appt with gyn - needs hydraulic exam table  -AWV today with health coach -Patient  advised to return or notify a doctor immediately if symptoms worsen or persist or new concerns arise.  Patient Instructions  BEFORE YOU LEAVE: -follow up: 3 months -wellness visit with health coach -call around to gyn offices - need hydraulic table for eval postmenopausal bleeding and pap -counseling brochure -contact information for [facility] -contact information for her gastroenterologist  Please call your gastroenterologist today for an appointment about your liver.  Call today to set up counseling appointment and psychiatry appointment.  Please seek emergency care and call 911 if severe depression or thoughts of hurting yourself or others.  Advise regular aerobic chair exercises (at least 150 minutes per week of sweaty exercise) and a healthy diet. Try to eat at least 5-9 servings of vegetables and fruits per day (not corn, potatoes or bananas.) Avoid sweets, red meat, pork, butter, fried foods, fast food, processed food, excessive dairy, eggs and coconut. Replace bad fats with good fats - fish, nuts and seeds, canola oil, olive oil.         Colin Benton R., DO

## 2016-12-13 NOTE — Progress Notes (Signed)
Sue Harari R., DO  

## 2016-12-19 ENCOUNTER — Other Ambulatory Visit: Payer: Self-pay

## 2016-12-19 ENCOUNTER — Ambulatory Visit: Payer: Medicare HMO | Admitting: Endocrinology

## 2016-12-19 DIAGNOSIS — N950 Postmenopausal bleeding: Secondary | ICD-10-CM

## 2016-12-20 ENCOUNTER — Encounter: Payer: Self-pay | Admitting: Family Medicine

## 2017-01-03 DIAGNOSIS — I509 Heart failure, unspecified: Secondary | ICD-10-CM | POA: Diagnosis not present

## 2017-01-03 DIAGNOSIS — N183 Chronic kidney disease, stage 3 (moderate): Secondary | ICD-10-CM | POA: Diagnosis not present

## 2017-01-04 DIAGNOSIS — N950 Postmenopausal bleeding: Secondary | ICD-10-CM | POA: Diagnosis not present

## 2017-01-04 DIAGNOSIS — Z124 Encounter for screening for malignant neoplasm of cervix: Secondary | ICD-10-CM | POA: Diagnosis not present

## 2017-01-12 DIAGNOSIS — E876 Hypokalemia: Secondary | ICD-10-CM | POA: Diagnosis not present

## 2017-01-22 ENCOUNTER — Inpatient Hospital Stay (HOSPITAL_COMMUNITY)
Admission: EM | Admit: 2017-01-22 | Discharge: 2017-01-30 | DRG: 291 | Disposition: A | Discharge status: Home / Self Care | Payer: Medicare HMO | Attending: Family Medicine | Admitting: Family Medicine

## 2017-01-22 ENCOUNTER — Encounter (HOSPITAL_COMMUNITY): Payer: Self-pay

## 2017-01-22 ENCOUNTER — Emergency Department (HOSPITAL_COMMUNITY): Payer: Medicare HMO

## 2017-01-22 DIAGNOSIS — K7460 Unspecified cirrhosis of liver: Secondary | ICD-10-CM | POA: Diagnosis present

## 2017-01-22 DIAGNOSIS — I509 Heart failure, unspecified: Secondary | ICD-10-CM

## 2017-01-22 DIAGNOSIS — Z7982 Long term (current) use of aspirin: Secondary | ICD-10-CM

## 2017-01-22 DIAGNOSIS — I248 Other forms of acute ischemic heart disease: Secondary | ICD-10-CM | POA: Diagnosis not present

## 2017-01-22 DIAGNOSIS — Z91018 Allergy to other foods: Secondary | ICD-10-CM

## 2017-01-22 DIAGNOSIS — I472 Ventricular tachycardia: Secondary | ICD-10-CM | POA: Diagnosis not present

## 2017-01-22 DIAGNOSIS — E785 Hyperlipidemia, unspecified: Secondary | ICD-10-CM | POA: Diagnosis not present

## 2017-01-22 DIAGNOSIS — N179 Acute kidney failure, unspecified: Secondary | ICD-10-CM | POA: Diagnosis present

## 2017-01-22 DIAGNOSIS — Z91040 Latex allergy status: Secondary | ICD-10-CM

## 2017-01-22 DIAGNOSIS — Z818 Family history of other mental and behavioral disorders: Secondary | ICD-10-CM

## 2017-01-22 DIAGNOSIS — N183 Chronic kidney disease, stage 3 unspecified: Secondary | ICD-10-CM | POA: Diagnosis present

## 2017-01-22 DIAGNOSIS — R0602 Shortness of breath: Secondary | ICD-10-CM | POA: Diagnosis not present

## 2017-01-22 DIAGNOSIS — Z993 Dependence on wheelchair: Secondary | ICD-10-CM

## 2017-01-22 DIAGNOSIS — Z794 Long term (current) use of insulin: Secondary | ICD-10-CM | POA: Diagnosis not present

## 2017-01-22 DIAGNOSIS — Z885 Allergy status to narcotic agent status: Secondary | ICD-10-CM

## 2017-01-22 DIAGNOSIS — H409 Unspecified glaucoma: Secondary | ICD-10-CM | POA: Diagnosis present

## 2017-01-22 DIAGNOSIS — H547 Unspecified visual loss: Secondary | ICD-10-CM | POA: Diagnosis present

## 2017-01-22 DIAGNOSIS — E1122 Type 2 diabetes mellitus with diabetic chronic kidney disease: Secondary | ICD-10-CM | POA: Diagnosis present

## 2017-01-22 DIAGNOSIS — I5023 Acute on chronic systolic (congestive) heart failure: Secondary | ICD-10-CM | POA: Diagnosis not present

## 2017-01-22 DIAGNOSIS — I371 Nonrheumatic pulmonary valve insufficiency: Secondary | ICD-10-CM | POA: Diagnosis present

## 2017-01-22 DIAGNOSIS — Z89512 Acquired absence of left leg below knee: Secondary | ICD-10-CM

## 2017-01-22 DIAGNOSIS — Z881 Allergy status to other antibiotic agents status: Secondary | ICD-10-CM

## 2017-01-22 DIAGNOSIS — Z89511 Acquired absence of right leg below knee: Secondary | ICD-10-CM

## 2017-01-22 DIAGNOSIS — I5033 Acute on chronic diastolic (congestive) heart failure: Secondary | ICD-10-CM | POA: Diagnosis not present

## 2017-01-22 DIAGNOSIS — R079 Chest pain, unspecified: Secondary | ICD-10-CM | POA: Diagnosis not present

## 2017-01-22 DIAGNOSIS — Z955 Presence of coronary angioplasty implant and graft: Secondary | ICD-10-CM

## 2017-01-22 DIAGNOSIS — I2510 Atherosclerotic heart disease of native coronary artery without angina pectoris: Secondary | ICD-10-CM | POA: Diagnosis present

## 2017-01-22 DIAGNOSIS — J9621 Acute and chronic respiratory failure with hypoxia: Secondary | ICD-10-CM | POA: Diagnosis not present

## 2017-01-22 DIAGNOSIS — I5043 Acute on chronic combined systolic (congestive) and diastolic (congestive) heart failure: Secondary | ICD-10-CM | POA: Diagnosis not present

## 2017-01-22 DIAGNOSIS — F329 Major depressive disorder, single episode, unspecified: Secondary | ICD-10-CM | POA: Diagnosis present

## 2017-01-22 DIAGNOSIS — Z79899 Other long term (current) drug therapy: Secondary | ICD-10-CM

## 2017-01-22 DIAGNOSIS — J9601 Acute respiratory failure with hypoxia: Secondary | ICD-10-CM | POA: Diagnosis not present

## 2017-01-22 DIAGNOSIS — E11319 Type 2 diabetes mellitus with unspecified diabetic retinopathy without macular edema: Secondary | ICD-10-CM | POA: Diagnosis present

## 2017-01-22 DIAGNOSIS — I130 Hypertensive heart and chronic kidney disease with heart failure and stage 1 through stage 4 chronic kidney disease, or unspecified chronic kidney disease: Secondary | ICD-10-CM | POA: Diagnosis not present

## 2017-01-22 DIAGNOSIS — E1151 Type 2 diabetes mellitus with diabetic peripheral angiopathy without gangrene: Secondary | ICD-10-CM | POA: Diagnosis present

## 2017-01-22 DIAGNOSIS — Z887 Allergy status to serum and vaccine status: Secondary | ICD-10-CM

## 2017-01-22 DIAGNOSIS — Z8249 Family history of ischemic heart disease and other diseases of the circulatory system: Secondary | ICD-10-CM

## 2017-01-22 DIAGNOSIS — Z880 Allergy status to penicillin: Secondary | ICD-10-CM

## 2017-01-22 DIAGNOSIS — E876 Hypokalemia: Secondary | ICD-10-CM | POA: Diagnosis present

## 2017-01-22 DIAGNOSIS — Z882 Allergy status to sulfonamides status: Secondary | ICD-10-CM

## 2017-01-22 DIAGNOSIS — Z9109 Other allergy status, other than to drugs and biological substances: Secondary | ICD-10-CM

## 2017-01-22 DIAGNOSIS — Z6841 Body Mass Index (BMI) 40.0 and over, adult: Secondary | ICD-10-CM

## 2017-01-22 DIAGNOSIS — E1121 Type 2 diabetes mellitus with diabetic nephropathy: Secondary | ICD-10-CM | POA: Diagnosis not present

## 2017-01-22 DIAGNOSIS — N184 Chronic kidney disease, stage 4 (severe): Secondary | ICD-10-CM | POA: Diagnosis not present

## 2017-01-22 DIAGNOSIS — R0902 Hypoxemia: Secondary | ICD-10-CM

## 2017-01-22 DIAGNOSIS — Z8261 Family history of arthritis: Secondary | ICD-10-CM

## 2017-01-22 DIAGNOSIS — Z823 Family history of stroke: Secondary | ICD-10-CM

## 2017-01-22 DIAGNOSIS — N171 Acute kidney failure with acute cortical necrosis: Secondary | ICD-10-CM | POA: Diagnosis not present

## 2017-01-22 DIAGNOSIS — R609 Edema, unspecified: Secondary | ICD-10-CM | POA: Diagnosis not present

## 2017-01-22 DIAGNOSIS — Z833 Family history of diabetes mellitus: Secondary | ICD-10-CM

## 2017-01-22 DIAGNOSIS — D696 Thrombocytopenia, unspecified: Secondary | ICD-10-CM | POA: Diagnosis present

## 2017-01-22 DIAGNOSIS — Z888 Allergy status to other drugs, medicaments and biological substances status: Secondary | ICD-10-CM

## 2017-01-22 LAB — AMMONIA: AMMONIA: 53 umol/L — AB (ref 9–35)

## 2017-01-22 LAB — I-STAT TROPONIN, ED: Troponin i, poc: 0.02 ng/mL (ref 0.00–0.08)

## 2017-01-22 LAB — BRAIN NATRIURETIC PEPTIDE: B Natriuretic Peptide: 474.8 pg/mL — ABNORMAL HIGH (ref 0.0–100.0)

## 2017-01-22 LAB — CBC
HCT: 49.6 % — ABNORMAL HIGH (ref 36.0–46.0)
HEMOGLOBIN: 16.5 g/dL — AB (ref 12.0–15.0)
MCH: 31.1 pg (ref 26.0–34.0)
MCHC: 33.3 g/dL (ref 30.0–36.0)
MCV: 93.4 fL (ref 78.0–100.0)
Platelets: 79 10*3/uL — ABNORMAL LOW (ref 150–400)
RBC: 5.31 MIL/uL — ABNORMAL HIGH (ref 3.87–5.11)
RDW: 15.7 % — AB (ref 11.5–15.5)
WBC: 6.4 10*3/uL (ref 4.0–10.5)

## 2017-01-22 LAB — BASIC METABOLIC PANEL
ANION GAP: 8 (ref 5–15)
BUN: 27 mg/dL — AB (ref 6–20)
CHLORIDE: 96 mmol/L — AB (ref 101–111)
CO2: 37 mmol/L — AB (ref 22–32)
Calcium: 9.4 mg/dL (ref 8.9–10.3)
Creatinine, Ser: 1.33 mg/dL — ABNORMAL HIGH (ref 0.44–1.00)
GFR calc Af Amer: 53 mL/min — ABNORMAL LOW (ref >60)
GFR, EST NON AFRICAN AMERICAN: 46 mL/min — AB (ref >60)
GLUCOSE: 88 mg/dL (ref 65–99)
POTASSIUM: 3 mmol/L — AB (ref 3.5–5.1)
Sodium: 141 mmol/L (ref 135–145)

## 2017-01-22 LAB — HEPATIC FUNCTION PANEL
ALBUMIN: 3.3 g/dL — AB (ref 3.5–5.0)
ALK PHOS: 108 U/L (ref 38–126)
ALT: 10 U/L — ABNORMAL LOW (ref 14–54)
AST: 22 U/L (ref 15–41)
BILIRUBIN DIRECT: 1.6 mg/dL — AB (ref 0.1–0.5)
BILIRUBIN TOTAL: 4.6 mg/dL — AB (ref 0.3–1.2)
Indirect Bilirubin: 3 mg/dL — ABNORMAL HIGH (ref 0.3–0.9)
Total Protein: 6.8 g/dL (ref 6.5–8.1)

## 2017-01-22 LAB — GLUCOSE, CAPILLARY: Glucose-Capillary: 68 mg/dL (ref 65–99)

## 2017-01-22 LAB — LIPASE, BLOOD: Lipase: 31 U/L (ref 11–51)

## 2017-01-22 MED ORDER — SODIUM CHLORIDE 0.9% FLUSH
3.0000 mL | INTRAVENOUS | Status: DC | PRN
  Administered 2017-01-29: 3 mL via INTRAVENOUS
  Filled 2017-01-22: qty 3

## 2017-01-22 MED ORDER — ZOLPIDEM TARTRATE 5 MG PO TABS: 5.0000 mg | ORAL_TABLET | Freq: Every evening | ORAL | Status: DC | PRN

## 2017-01-22 MED ORDER — POTASSIUM CHLORIDE 20 MEQ/15ML (10%) PO SOLN
40.0000 meq | Freq: Every day | ORAL | Status: DC
  Administered 2017-01-22 – 2017-01-30 (×9): 40 meq via ORAL
  Filled 2017-01-22 (×9): qty 30

## 2017-01-22 MED ORDER — SODIUM CHLORIDE 0.9% FLUSH
3.0000 mL | Freq: Two times a day (BID) | INTRAVENOUS | Status: DC
  Administered 2017-01-23 – 2017-01-30 (×12): 3 mL via INTRAVENOUS

## 2017-01-22 MED ORDER — POTASSIUM CHLORIDE CRYS ER 20 MEQ PO TBCR
40.0000 meq | EXTENDED_RELEASE_TABLET | Freq: Once | ORAL | Status: DC
  Filled 2017-01-22: qty 2

## 2017-01-22 MED ORDER — SODIUM CHLORIDE 0.9 % IV SOLN: 250.0000 mL | INTRAVENOUS | Status: DC | PRN

## 2017-01-22 MED ORDER — ONDANSETRON HCL 4 MG/2ML IJ SOLN
4.0000 mg | Freq: Three times a day (TID) | INTRAMUSCULAR | Status: DC | PRN
  Administered 2017-01-25 – 2017-01-26 (×3): 4 mg via INTRAVENOUS
  Filled 2017-01-22 (×3): qty 2

## 2017-01-22 MED ORDER — BENZONATATE 100 MG PO CAPS
100.0000 mg | ORAL_CAPSULE | Freq: Three times a day (TID) | ORAL | Status: DC | PRN
  Administered 2017-01-23: 100 mg via ORAL
  Filled 2017-01-22: qty 1

## 2017-01-22 MED ORDER — FUROSEMIDE 10 MG/ML IJ SOLN
80.0000 mg | Freq: Once | INTRAMUSCULAR | Status: AC
  Administered 2017-01-22: 80 mg via INTRAVENOUS
  Filled 2017-01-22: qty 8

## 2017-01-22 MED ORDER — FUROSEMIDE 10 MG/ML IJ SOLN
80.0000 mg | Freq: Two times a day (BID) | INTRAMUSCULAR | Status: DC
  Administered 2017-01-23 – 2017-01-28 (×10): 80 mg via INTRAVENOUS
  Filled 2017-01-22 (×11): qty 8

## 2017-01-22 MED ORDER — ISOSORBIDE MONONITRATE ER 30 MG PO TB24
30.0000 mg | ORAL_TABLET | Freq: Every day | ORAL | Status: DC
  Administered 2017-01-23: 30 mg via ORAL
  Filled 2017-01-22: qty 1

## 2017-01-22 MED ORDER — LISINOPRIL 10 MG PO TABS: 10.0000 mg | ORAL_TABLET | Freq: Every day | ORAL | Status: DC

## 2017-01-22 MED ORDER — ASPIRIN 325 MG PO TABS
325.0000 mg | ORAL_TABLET | Freq: Every day | ORAL | Status: DC
  Administered 2017-01-22 – 2017-01-29 (×8): 325 mg via ORAL
  Filled 2017-01-22 (×8): qty 1

## 2017-01-22 MED ORDER — ATORVASTATIN CALCIUM 20 MG PO TABS
20.0000 mg | ORAL_TABLET | Freq: Every day | ORAL | Status: DC
  Administered 2017-01-23 – 2017-01-30 (×7): 20 mg via ORAL
  Filled 2017-01-22 (×8): qty 1

## 2017-01-22 MED ORDER — ACETAMINOPHEN 325 MG PO TABS
650.0000 mg | ORAL_TABLET | ORAL | Status: DC | PRN
  Administered 2017-01-25 – 2017-01-27 (×2): 650 mg via ORAL
  Filled 2017-01-22 (×2): qty 2

## 2017-01-22 MED ORDER — MORPHINE SULFATE (PF) 4 MG/ML IV SOLN
2.0000 mg | INTRAVENOUS | Status: DC | PRN
  Administered 2017-01-23 – 2017-01-29 (×2): 2 mg via INTRAVENOUS
  Filled 2017-01-22 (×3): qty 1

## 2017-01-22 MED ORDER — NITROGLYCERIN 0.4 MG SL SUBL: 0.4000 mg | SUBLINGUAL_TABLET | SUBLINGUAL | Status: DC | PRN

## 2017-01-22 MED ORDER — CYCLOBENZAPRINE HCL 10 MG PO TABS: 5.0000 mg | ORAL_TABLET | Freq: Two times a day (BID) | ORAL | Status: DC | PRN

## 2017-01-22 MED ORDER — INSULIN GLARGINE 100 UNIT/ML ~~LOC~~ SOLN
20.0000 [IU] | Freq: Every day | SUBCUTANEOUS | Status: DC
Start: 2017-01-23 — End: 2017-01-30
  Administered 2017-01-23 – 2017-01-30 (×7): 20 [IU] via SUBCUTANEOUS
  Filled 2017-01-22 (×10): qty 0.2

## 2017-01-22 MED ORDER — DIPHENHYDRAMINE HCL 25 MG PO TABS: 50.0000 mg | ORAL_TABLET | Freq: Two times a day (BID) | ORAL | Status: DC | PRN

## 2017-01-22 MED ORDER — HYDRALAZINE HCL 20 MG/ML IJ SOLN: 5.0000 mg | INTRAMUSCULAR | Status: DC | PRN

## 2017-01-22 MED ORDER — INSULIN ASPART 100 UNIT/ML ~~LOC~~ SOLN
0.0000 [IU] | Freq: Three times a day (TID) | SUBCUTANEOUS | Status: DC
  Administered 2017-01-23 (×3): 2 [IU] via SUBCUTANEOUS
  Administered 2017-01-24: 3 [IU] via SUBCUTANEOUS
  Administered 2017-01-24: 5 [IU] via SUBCUTANEOUS
  Administered 2017-01-25 (×2): 3 [IU] via SUBCUTANEOUS
  Administered 2017-01-25: 2 [IU] via SUBCUTANEOUS
  Administered 2017-01-26: 3 [IU] via SUBCUTANEOUS
  Administered 2017-01-26 – 2017-01-27 (×3): 2 [IU] via SUBCUTANEOUS
  Administered 2017-01-27: 1 [IU] via SUBCUTANEOUS
  Administered 2017-01-28 – 2017-01-29 (×2): 2 [IU] via SUBCUTANEOUS
  Administered 2017-01-29: 3 [IU] via SUBCUTANEOUS
  Administered 2017-01-30: 2 [IU] via SUBCUTANEOUS
  Administered 2017-01-30: 1 [IU] via SUBCUTANEOUS

## 2017-01-22 MED ORDER — HYDRALAZINE HCL 10 MG PO TABS
10.0000 mg | ORAL_TABLET | Freq: Three times a day (TID) | ORAL | Status: DC
  Filled 2017-01-22: qty 1

## 2017-01-22 NOTE — ED Notes (Signed)
Pt has returned from xray at this time.  

## 2017-01-22 NOTE — ED Provider Notes (Signed)
[facility] DEPT Provider Note   CSN: [id number] Arrival date & time: 01/22/17  1604     History   Chief Complaint Chief Complaint  Patient presents with  . Chest Pain    HPI Sue Martin is a 51 y.o. female.  HPI  51 year old female with 2-3 days of progressively worsening chest pressure, weight gain and shortness of breath. Similar previous episodes of CHF exacerbation. States she's been trying to get her primary doctor to switch around her diuretics to no avail. States the pain is progressively worsening during this time and this is why she came here for evaluation today. She does have a cough but it seems to be most related to position. She does not have any fever. Does report abdominal swelling but has bilateral below the knee amputations. She states that her sounds are more swollen than normal. States compliant with medications. Does not weigh herself.  Past Medical History:  Diagnosis Date  . CAD (coronary artery disease) 12/12/2014   -s/p 2V PCI of the LAD/RCA with taxus stents -cardiologist is Dr. Carlis Abbott, [facility]   . CHF (congestive heart failure) ([place])    sees Dr. Novella Rob  . Chronic kidney disease   . Depression   . Diabetes mellitus with renal complications (HCC)    PVD and retinopathy, S/p bilat ambutations  . Glaucoma   . Liver cirrhosis ([place])   . Lyme disease   . Retinal detachment    sees Dr. Rose Phi per her report  . S/P bilateral BKA (below knee amputation) ([place]) 11/28/2014    Patient Active Problem List   Diagnosis Date Noted  . Chest pain 01/22/2017  . Cardiomyopathy, ischemic 06/13/2016  . Type II diabetes mellitus with ophthalmic manifestations, uncontrolled ([place]) 12/22/2014  . Hyperlipidemia 12/22/2014  . CAD (coronary artery disease) 12/12/2014  . Diabetic retinopathy ([place]) 12/12/2014  . PVD (peripheral vascular disease) ([place]) 12/12/2014  . CKD (chronic kidney disease) stage 3, GFR 30-59 ml/min 12/12/2014  . Type 2 diabetes mellitus with  diabetic nephropathy ([place]) 11/28/2014  . Acute on chronic systolic CHF (congestive heart failure) ([place]) 11/28/2014  . Cirrhosis of liver without ascites ([place]) 11/28/2014  . Generalized OA 11/28/2014  . S/P bilateral BKA (below knee amputation) ([place]) 11/28/2014    Past Surgical History:  Procedure Laterality Date  . ESOPHAGOGASTRODUODENOSCOPY (EGD) WITH PROPOFOL N/A 07/07/2016   Procedure: ESOPHAGOGASTRODUODENOSCOPY (EGD) WITH PROPOFOL;  Surgeon: Milus Banister, MD;  Location: [facility] ENDOSCOPY;  Service: Endoscopy;  Laterality: N/A;  . LASIK    . LEG AMPUTATION BELOW KNEE  09/09/2011, 09/11/2011  . TUBAL LIGATION      OB History    No data available       Home Medications    Prior to Admission medications   Medication Sig Start Date End Date Taking? Authorizing Provider  aspirin 325 MG tablet Take 325 mg by mouth at bedtime.     [provider]  atorvastatin (LIPITOR) 20 MG tablet Take 1 tablet (20 mg total) by mouth daily. Patient taking differently: Take 20 mg by mouth every evening.  02/11/16   Lucretia Kern, DO  benzonatate (TESSALON PERLES) 100 MG capsule Take 1 capsule (100 mg total) by mouth 3 (three) times daily as needed for cough. 11/17/16   Lucretia Kern, DO  Blood Glucose Monitoring Suppl (ACCU-CHEK AVIVA PLUS) w/Device KIT Use to check blood sugar 3 times a day Dx Code E11.9 06/21/16   Elayne Snare, MD  cyclobenzaprine (FLEXERIL) 5 MG tablet Take 1  tablet (5 mg total) by mouth 2 (two) times daily as needed for muscle spasms. 11/17/16   Lucretia Kern, DO  diphenhydrAMINE (BENADRYL) 25 MG tablet Take 50 mg by mouth 2 (two) times daily as needed for allergies.    [provider]  glucose blood (ACCU-CHEK AVIVA) test strip Use as instructed 3 times a day to check blood sugar. Dx code E11.9 06/21/16   Elayne Snare, MD  hydrALAZINE (APRESOLINE) 10 MG tablet Take 1 tablet (10 mg total) by mouth 3 (three) times daily. 07/14/15 07/13/16  Lucretia Kern, DO  ibuprofen  (ADVIL,MOTRIN) 200 MG tablet Take 200 mg by mouth daily as needed for moderate pain.     [provider]  insulin aspart (NOVOLOG) 100 UNIT/ML FlexPen Inject 35 Units into the skin 3 (three) times daily with meals. 11/30/16   Elayne Snare, MD  insulin degludec (TRESIBA FLEXTOUCH) 100 UNIT/ML SOPN FlexTouch Pen Inject 0.4 mLs (40 Units total) into the skin daily with breakfast. Patient taking differently: Inject 30 Units into the skin daily with breakfast.  10/28/16   Elayne Snare, MD  isosorbide mononitrate (IMDUR) 30 MG 24 hr tablet Take 30 mg by mouth 3 (three) times daily.  06/28/15 07/01/16  [provider]  lisinopril (PRINIVIL,ZESTRIL) 10 MG tablet Take 10 mg by mouth at bedtime.  06/25/14 07/01/16  [provider]  metFORMIN (GLUCOPHAGE-XR) 500 MG 24 hr tablet Take 4 tablets (2,000 mg total) by mouth daily with supper. Patient taking differently: Take 500 mg by mouth 4 (four) times daily.  05/02/16   Elayne Snare, MD  metolazone (ZAROXOLYN) 5 MG tablet TAKE ONE TABLET BY MOUTH ONCE DAILY (30  MINUTES  BEFORE  DEMADEX  DOSE) Patient taking differently: Take 34ms at night 06/03/16   KLucretia Kern DO  potassium chloride 20 MEQ/15ML (10%) SOLN 60 mEq 2 (two) times daily.  10/29/16   [provider]  spironolactone (ALDACTONE) 25 MG tablet Take 1 tablet (25 mg total) by mouth daily. Patient taking differently: Take 25 mg by mouth at bedtime.  07/14/15   KLucretia Kern DO  torsemide (DEMADEX) 20 MG tablet TAKE ONE TABLET BY MOUTH TWICE DAILY 07/28/16   KLucretia Kern DO    Family History Family History  Problem Relation Age of Onset  . Arthritis Mother   . Heart disease Mother   . Mental illness Mother   . Diabetes Mother   . Arthritis Maternal Grandmother   . Diabetes Maternal Grandmother   . Arthritis Father   . CVA Father   . Hypertension Father   . Sudden death Paternal Grandfather     Social History Social History  Substance Use Topics  . Smoking status: Never  Smoker  . Smokeless tobacco: Never Used  . Alcohol use No     Allergies   Broccoli [brassica oleracea italica]; Influenza vaccines; Pneumococcal vaccines; Trulicity [dulaglutide]; Amoxicillin; Doxycycline; Iron; Latex; Lisinopril; Penicillins; Tape; Codeine; Sulfa antibiotics; and Sulfur   Review of Systems Review of Systems  All other systems reviewed and are negative.    Physical Exam Updated Vital Signs BP 108/78   Pulse 71   Temp 98 F (36.7 C) (Oral)   Resp (!) 21   Ht '4\' 7"'  (1.397 m)   Wt 136.1 kg (300 lb)   SpO2 94%   BMI 69.73 kg/m   Physical Exam  Constitutional: She appears well-developed and well-nourished.  HENT:  Head: Normocephalic and atraumatic.  Eyes: Conjunctivae and EOM are normal.  Neck: Normal range of motion.  Cardiovascular: Normal rate and regular rhythm.   Pulmonary/Chest: No stridor. Tachypnea noted. No respiratory distress. She has rales.  Sats are 91% on room air at rest prior to arrival on 2 L of oxygen she sats 94%.  Abdominal: Soft. She exhibits no distension.  Musculoskeletal: Normal range of motion.  Neurological: She is alert.  Skin: Skin is warm and dry.  Nursing note and vitals reviewed.    ED Treatments / Results  Labs (all labs ordered are listed, but only abnormal results are displayed) Labs Reviewed  BASIC METABOLIC PANEL - Abnormal; Notable for the following:       Result Value   Potassium 3.0 (*)    Chloride 96 (*)    CO2 37 (*)    BUN 27 (*)    Creatinine, Ser 1.33 (*)    GFR calc non Af Amer 46 (*)    GFR calc Af Amer 53 (*)    All other components within normal limits  CBC - Abnormal; Notable for the following:    RBC 5.31 (*)    Hemoglobin 16.5 (*)    HCT 49.6 (*)    RDW 15.7 (*)    Platelets 79 (*)    All other components within normal limits  BRAIN NATRIURETIC PEPTIDE - Abnormal; Notable for the following:    B Natriuretic Peptide 474.8 (*)    All other components within normal limits  I-STAT  TROPONIN, ED    EKG  EKG Interpretation None       Radiology Dg Chest 2 View  Result Date: 01/22/2017 CLINICAL DATA:  Patient with chest pain and shortness of breath for multiple days. EXAM: CHEST  2 VIEW COMPARISON:  Chest radiograph 09/13/2016. FINDINGS: Cardiomegaly. Bilateral interstitial pulmonary opacities. Trace bilateral pleural effusions. Thoracic spine degenerative changes. IMPRESSION: Cardiomegaly and interstitial pulmonary edema. Electronically Signed   By: Lovey Newcomer M.D.   On: 01/22/2017 18:45    Procedures Procedures (including critical care time)  Medications Ordered in ED Medications  potassium chloride SA (K-DUR,KLOR-CON) CR tablet 40 mEq (40 mEq Oral Not Given 01/22/17 1918)  furosemide (LASIX) injection 80 mg (80 mg Intravenous Given 01/22/17 1918)     Initial Impression / Assessment and Plan / ED Course  I have reviewed the triage vital signs and the nursing notes.  Pertinent labs & imaging results that were available during my care of the patient were reviewed by me and considered in my medical decision making (see chart for details).     Suspect fluid overload is likely cause for symptoms. Doubt primary ACS event with normal EKG. We'll give a dose of Lasix and have her admitted to the hospital for the same.  Final Clinical Impressions(s) / ED Diagnoses   Final diagnoses:  Acute on chronic congestive heart failure, unspecified heart failure type ([place])  Hypoxia    New Prescriptions New Prescriptions   No medications on file     Deontae Robson, Corene Cornea, MD 01/22/17 [phone]

## 2017-01-22 NOTE — ED Triage Notes (Signed)
Pt. Here from home via [facility] for chest pain x3days with SOB. Pt. Describes as centralized pressure and worse today. Pt. Given 1 nitro and 324 ASA which brought pain from 9 to a 6. Pt. Given another nitro which brought pain to a 3/10. Pt. 91% on RA when EMS arrived. Pt. Placed on 4L oxygen to get up to 97%. Pt. Hx of MI with stents, CHF, and DM. Pt. Bilateral BKA. Pt. Cardiologist with [facility] and states that she takes her fluid pill, but that it does not work and her MD refuses to change it.

## 2017-01-22 NOTE — H&P (Signed)
History and Physical    Sue Martin DOB: Jun 21, 1966 DOA: 01/22/2017  Referring MD/NP/PA:   PCP: Lucretia Kern, DO   Patient coming from:  The patient is coming from home.  At baseline, pt is partially dependent for most of ADL.    Chief Complaint: Shortness of breath, chest pain, weight gain  HPI: Sue Martin is a 51 y.o. female with medical history significant of sCHF with EF 39%, hypertension, hyperlipidemia, diabetes mellitus, depression, bilateral BKA, retinal detachment, Lyme's disease, liver cirrhosis, CAD, stent placement, chronic thrombocytopenia, who presents with shortness breath, chest pain and weight gain.  Patient states that she has been having SOB in the past 3 days, which has been progressively getting worse. It is associated with chest pain intermittently. The chest pain is located substernal area, 10 out of 10 in severity at the worst time, nonradiating, pressure-like. She has cough with clear mucus production, but no fever or chills. Patient states that she has been taking her diuretics consistently, but it does not work any more. He has been gaining weight, she states that her short size needed to be changed from 1 to 3X. She also reports nausea and vomiting 2 or 3 times today. No diarrhea. She does not have abdominal pain at rest, but vomiting induced abdominal pain. No symptoms of UTI or unilateral weakness. She can speak in full sentence.  ED Course: pt was found to have BNP 474.8, negative troponin, WBC 6.4, stable renal function, temperature normal, no tachycardia, oxygen saturation 99% on room air, chest x-ray showed pulmonary edema. Patient is admitted to telemetry bed as inpatient.  Review of Systems:   General: no fevers, chills, has weight gain, has poor appetite, has fatigue HEENT: no blurry vision, hearing changes or sore throat Respiratory: has dyspnea, coughing, no wheezing CV: has chest pain, no palpitations GI: has nausea, vomiting, no  abdominal pain, diarrhea, constipation GU: no dysuria, burning on urination, increased urinary frequency, hematuria  Ext: has leg edema Neuro: no unilateral weakness, numbness, or tingling, no vision change or hearing loss Skin: no rash, no skin tear. MSK: No muscle spasm, no deformity, no limitation of range of movement in spin Heme: No easy bruising.  Travel history: No recent long distant travel.  Allergy:  Allergies  Allergen Reactions  . Broccoli [Brassica Oleracea Italica] Anaphylaxis  . Influenza Vaccines Anaphylaxis    Per patient  . Pneumococcal Vaccines Anaphylaxis    Per patient  . Trulicity [Dulaglutide] Hives and Nausea And Vomiting  . Amoxicillin Nausea And Vomiting  . Doxycycline Hives and Nausea And Vomiting  . Iron Other (See Comments)    GI intolerance  . Latex Other (See Comments)    blisters  . Lisinopril Nausea Only  . Penicillins Other (See Comments)    GI intolerance, UTI, can tolerate in IV Tolerates cephalosporins  . Tape Other (See Comments)    Tears skin.  Please use "paper" tape  . Codeine Nausea And Vomiting and Rash  . Sulfa Antibiotics Nausea And Vomiting and Rash  . Sulfur Nausea And Vomiting and Rash    Past Medical History:  Diagnosis Date  . CAD (coronary artery disease) 12/12/2014   -s/p 2V PCI of the LAD/RCA with taxus stents -cardiologist is Dr. Carlis Abbott, [facility]   . CHF (congestive heart failure) ([place])    sees Dr. Novella Rob  . Chronic kidney disease   . Depression   . Diabetes mellitus with renal complications (HCC)    PVD and retinopathy, S/p bilat  ambutations  . Glaucoma   . Liver cirrhosis ([place])   . Lyme disease   . Retinal detachment    sees Dr. Rose Phi per her report  . S/P bilateral BKA (below knee amputation) ([place]) 11/28/2014    Past Surgical History:  Procedure Laterality Date  . ESOPHAGOGASTRODUODENOSCOPY (EGD) WITH PROPOFOL N/A 07/07/2016   Procedure: ESOPHAGOGASTRODUODENOSCOPY (EGD) WITH PROPOFOL;  Surgeon: Milus Banister, MD;  Location: [facility] END[facility];  Service: Endoscopy;  Laterality: N/A;  . LASIK    . LEG AMPUTATION BELOW KNEE  09/09/2011, 09/11/2011  . TUBAL LIGATION      Social History:  reports that she has never smoked. She has never used smokeless tobacco. She reports that she does not drink alcohol or use drugs.  Family History:  Family History  Problem Relation Age of Onset  . Arthritis Mother   . Heart disease Mother   . Mental illness Mother   . Diabetes Mother   . Arthritis Maternal Grandmother   . Diabetes Maternal Grandmother   . Arthritis Father   . CVA Father   . Hypertension Father   . Sudden death Paternal Grandfather      Prior to Admission medications   Medication Sig Start Date End Date Taking? Authorizing Provider  aspirin 325 MG tablet Take 325 mg by mouth at bedtime.     [provider]  atorvastatin (LIPITOR) 20 MG tablet Take 1 tablet (20 mg total) by mouth daily. Patient taking differently: Take 20 mg by mouth every evening.  02/11/16   Lucretia Kern, DO  benzonatate (TESSALON PERLES) 100 MG capsule Take 1 capsule (100 mg total) by mouth 3 (three) times daily as needed for cough. 11/17/16   Lucretia Kern, DO  Blood Glucose Monitoring Suppl (ACCU-CHEK AVIVA PLUS) w/Device KIT Use to check blood sugar 3 times a day Dx Code E11.9 06/21/16   Elayne Snare, MD  cyclobenzaprine (FLEXERIL) 5 MG tablet Take 1 tablet (5 mg total) by mouth 2 (two) times daily as needed for muscle spasms. 11/17/16   Lucretia Kern, DO  diphenhydrAMINE (BENADRYL) 25 MG tablet Take 50 mg by mouth 2 (two) times daily as needed for allergies.    [provider]  glucose blood (ACCU-CHEK AVIVA) test strip Use as instructed 3 times a day to check blood sugar. Dx code E11.9 06/21/16   Elayne Snare, MD  hydrALAZINE (APRESOLINE) 10 MG tablet Take 1 tablet (10 mg total) by mouth 3 (three) times daily. 07/14/15 07/13/16  Lucretia Kern, DO  ibuprofen (ADVIL,MOTRIN) 200 MG tablet Take 200 mg by  mouth daily as needed for moderate pain.     [provider]  insulin aspart (NOVOLOG) 100 UNIT/ML FlexPen Inject 35 Units into the skin 3 (three) times daily with meals. 11/30/16   Elayne Snare, MD  insulin degludec (TRESIBA FLEXTOUCH) 100 UNIT/ML SOPN FlexTouch Pen Inject 0.4 mLs (40 Units total) into the skin daily with breakfast. Patient taking differently: Inject 30 Units into the skin daily with breakfast.  10/28/16   Elayne Snare, MD  isosorbide mononitrate (IMDUR) 30 MG 24 hr tablet Take 30 mg by mouth 3 (three) times daily.  06/28/15 07/01/16  [provider]  lisinopril (PRINIVIL,ZESTRIL) 10 MG tablet Take 10 mg by mouth at bedtime.  06/25/14 07/01/16  [provider]  metFORMIN (GLUCOPHAGE-XR) 500 MG 24 hr tablet Take 4 tablets (2,000 mg total) by mouth daily with supper. Patient taking differently: Take 500 mg by mouth 4 (four)  times daily.  05/02/16   Elayne Snare, MD  metolazone (ZAROXOLYN) 5 MG tablet TAKE ONE TABLET BY MOUTH ONCE DAILY (30  MINUTES  BEFORE  DEMADEX  DOSE) Patient taking differently: Take 72ms at night 06/03/16   KLucretia Kern DO  potassium chloride 20 MEQ/15ML (10%) SOLN 60 mEq 2 (two) times daily.  10/29/16   [provider]  spironolactone (ALDACTONE) 25 MG tablet Take 1 tablet (25 mg total) by mouth daily. Patient taking differently: Take 25 mg by mouth at bedtime.  07/14/15   KLucretia Kern DO  torsemide (DEMADEX) 20 MG tablet TAKE ONE TABLET BY MOUTH TWICE DAILY 07/28/16   KLucretia Kern DO    Physical Exam: Vitals:   01/22/17 1630 01/22/17 1715 01/22/17 1900 01/22/17 1945  BP: 107/61 117/73 108/78 115/90  Pulse: 73 76 71 77  Resp: 19 20 (!) 21 (!) 25  Temp:      TempSrc:      SpO2: 97% 97% 94% 92%  Weight:      Height:       General: Not in acute distress HEENT:       Eyes: PERRL, EOMI, no scleral icterus.       ENT: No discharge from the ears and nose, no pharynx injection, no tonsillar enlargement.        Neck: Difficult to  assess JVD due to obesity, no bruit, no mass felt. Heme: No neck lymph node enlargement. Cardiac: S1/S2, RRR, No murmurs, No gallops or rubs. Respiratory: has rales, no wheezing, rhonchi or rubs. GI: Soft, nondistended, has tenderness diffusely (seems to be abdominal wall tenderness), no rebound pain, no organomegaly, BS present. GU: No hematuria Ext: 2+ leg edema bilaterally. S/p of bilateral BKA Musculoskeletal: No joint deformities, No joint redness or warmth, no limitation of ROM in spin. Skin: No rashes.  Neuro: Alert, oriented X3, cranial nerves II-XII grossly intact, moves all extremities normally. Psych: Patient is not psychotic, no suicidal or hemocidal ideation.  Labs on Admission: I have personally reviewed following labs and imaging studies  CBC:  Recent Labs Lab 01/22/17 1723  WBC 6.4  HGB 16.5*  HCT 49.6*  MCV 93.4  PLT 79*   Basic Metabolic Panel:  Recent Labs Lab 01/22/17 1723  NA 141  K 3.0*  CL 96*  CO2 37*  GLUCOSE 88  BUN 27*  CREATININE 1.33*  CALCIUM 9.4   GFR: Estimated Creatinine Clearance: 59.8 mL/min (A) (by C-G formula based on SCr of 1.33 mg/dL (H)). Liver Function Tests: No results for input(s): AST, ALT, ALKPHOS, BILITOT, PROT, ALBUMIN in the last 168 hours. No results for input(s): LIPASE, AMYLASE in the last 168 hours. No results for input(s): AMMONIA in the last 168 hours. Coagulation Profile: No results for input(s): INR, PROTIME in the last 168 hours. Cardiac Enzymes: No results for input(s): CKTOTAL, CKMB, CKMBINDEX, TROPONINI in the last 168 hours. BNP (last 3 results) No results for input(s): PROBNP in the last 8760 hours. HbA1C: No results for input(s): HGBA1C in the last 72 hours. CBG: No results for input(s): GLUCAP in the last 168 hours. Lipid Profile: No results for input(s): CHOL, HDL, LDLCALC, TRIG, CHOLHDL, LDLDIRECT in the last 72 hours. Thyroid Function Tests: No results for input(s): TSH, T4TOTAL, FREET4,  T3FREE, THYROIDAB in the last 72 hours. Anemia Panel: No results for input(s): VITAMINB12, FOLATE, FERRITIN, TIBC, IRON, RETICCTPCT in the last 72 hours. Urine analysis:    Component Value Date/Time   COLORURINE YELLOW 10/28/2016 1514  APPEARANCEUR CLEAR 10/28/2016 1514   LABSPEC 1.010 10/28/2016 1514   PHURINE 7.0 10/28/2016 1514   GLUCOSEU NEGATIVE 10/28/2016 1514   HGBUR NEGATIVE 10/28/2016 [place] 10/28/2016 1514   BILIRUBINUR negative 01/23/2015 1107   [place] 10/28/2016 1514   PROTEINUR 30 (A) 02/20/2015 1930   UROBILINOGEN 0.2 10/28/2016 1514   NITRITE NEGATIVE 10/28/2016 1514   LEUKOCYTESUR NEGATIVE 10/28/2016 1514   Sepsis Labs: _0 (procalcitonin:4,lacticidven:4) )No results found for this or any previous visit (from the past 240 hour(s)).   Radiological Exams on Admission: Dg Chest 2 View  Result Date: 01/22/2017 CLINICAL DATA:  Patient with chest pain and shortness of breath for multiple days. EXAM: CHEST  2 VIEW COMPARISON:  Chest radiograph 09/13/2016. FINDINGS: Cardiomegaly. Bilateral interstitial pulmonary opacities. Trace bilateral pleural effusions. Thoracic spine degenerative changes. IMPRESSION: Cardiomegaly and interstitial pulmonary edema. Electronically Signed   By: Lovey Newcomer M.D.   On: 01/22/2017 18:45     EKG: Independently reviewed.  Sinus rhythm, QTC 501, PVC, poor R-wave progression   Assessment/Plan Principal Problem:   Acute on chronic systolic CHF (congestive heart failure) (HCC) Active Problems:   Type 2 diabetes mellitus with diabetic nephropathy (HCC)   Cirrhosis of liver without ascites (HCC)   S/P bilateral BKA (below knee amputation) (HCC)   CAD (coronary artery disease)   CKD (chronic kidney disease) stage 3, GFR 30-59 ml/min   Hyperlipidemia   Chest pain   Acute on chronic respiratory failure with hypoxia (HCC)   Thrombocytopenia (HCC)   Acute respiratory failure with hypoxia due to acute  on chronic systolic CHF: Patient has weight gain, leg edema, pulmonary edema on chest x-ray, elevated BNP, consistent with CHF exacerbation. 2-D echo on 11/06/09 showed EF of 39%, repeated 2-D echo on 03/27/15 basically showed no significant change.  -will admit to tele bed as inpt. -Lasix 80 mg bid by IV -trop x 3 -2d echo -will continue home ASA -Daily weights -strict I/O's -Low salt diet  Chest pain and hx of CAD: s/p stent. Patient's chest pain has resolved currently. Most likely due to demand ischemia secondary to CHF exacerbation. -Follow-up troponin 3 -When necessary morphine and nitroglycerin for pain -On aspirin and Lipitor - follow-up 2-D echo  DM-II: Last A1c 8.7, poorly controled. Patient is taking metformin, NovoLog, degludec insulin at home -will decrease Lantus dose from 30-->20 units daily  -SSI  Cirrhosis of liver without ascites [facility]): Mental status normal. -Check ammonia level and LFT  CKD-III: Stable. Baseline creatinine 1.1-1.3. Her creatinine is 1.33, BUN 2017. -Follow up renal function by BMP  HLD: -lipitor  Thrombocytopenia ([place]): This is a chronic issue. Most likely due to liver cirrhosis. Mental status normal. No bleeding tendency. -Follow-up by CBC  Hypokalemia: K=3.0 on admission. - Repleted - Check Mg level  Nausea, vomiting: nd no diarrhea. Patient does not have abdominal pain at resting, but vomiting induced some abdominal pain. On physical examination, patient seems to have abdominal wall tenderness on examination which is likely due to abdominal swelling. -Check lipase -When necessary Zofran for nausea.   DVT ppx: SCD Code Status: Full code Family Communication: None at bed side.   Disposition Plan:  Anticipate discharge back to previous home environment Consults called:  none Admission status:  Inpatient/tele         Date of Service 01/22/2017    Ivor Costa [facility] Pager [phone]  If 7PM-7AM, please contact  night-coverage [email or web address] Password [email or web address] 01/22/2017, 8:45 PM

## 2017-01-22 NOTE — ED Notes (Signed)
Patient assisted in moving over to bedside commode.  Patent almost totally independent in ambulation.

## 2017-01-23 ENCOUNTER — Inpatient Hospital Stay (HOSPITAL_COMMUNITY): Payer: Medicare HMO

## 2017-01-23 ENCOUNTER — Ambulatory Visit: Payer: Medicare HMO | Admitting: Endocrinology

## 2017-01-23 DIAGNOSIS — I5033 Acute on chronic diastolic (congestive) heart failure: Secondary | ICD-10-CM

## 2017-01-23 LAB — BASIC METABOLIC PANEL
Anion gap: 10 (ref 5–15)
BUN: 27 mg/dL — AB (ref 6–20)
CALCIUM: 9.4 mg/dL (ref 8.9–10.3)
CO2: 37 mmol/L — ABNORMAL HIGH (ref 22–32)
Chloride: 94 mmol/L — ABNORMAL LOW (ref 101–111)
Creatinine, Ser: 1.39 mg/dL — ABNORMAL HIGH (ref 0.44–1.00)
GFR calc Af Amer: 50 mL/min — ABNORMAL LOW (ref >60)
GFR, EST NON AFRICAN AMERICAN: 43 mL/min — AB (ref >60)
GLUCOSE: 106 mg/dL — AB (ref 65–99)
Potassium: 2.9 mmol/L — ABNORMAL LOW (ref 3.5–5.1)
Sodium: 141 mmol/L (ref 135–145)

## 2017-01-23 LAB — GLUCOSE, CAPILLARY
GLUCOSE-CAPILLARY: 210 mg/dL — AB (ref 65–99)
Glucose-Capillary: 154 mg/dL — ABNORMAL HIGH (ref 65–99)
Glucose-Capillary: 200 mg/dL — ABNORMAL HIGH (ref 65–99)
Glucose-Capillary: 179 mg/dL — ABNORMAL HIGH (ref 65–99)

## 2017-01-23 LAB — RAPID URINE DRUG SCREEN, HOSP PERFORMED
Amphetamines: NOT DETECTED
BARBITURATES: NOT DETECTED
BENZODIAZEPINES: NOT DETECTED
COCAINE: NOT DETECTED
Opiates: NOT DETECTED
Tetrahydrocannabinol: NOT DETECTED

## 2017-01-23 LAB — ECHOCARDIOGRAM COMPLETE
Height: 55 in
Weight: 3990.4 oz

## 2017-01-23 LAB — LIPID PANEL
CHOL/HDL RATIO: 5.2 ratio
CHOLESTEROL: 135 mg/dL (ref 0–200)
HDL: 26 mg/dL — ABNORMAL LOW (ref >40)
LDL Cholesterol: 92 mg/dL (ref 0–99)
Triglycerides: 87 mg/dL (ref <150)
VLDL: 17 mg/dL (ref 0–40)

## 2017-01-23 LAB — MAGNESIUM: MAGNESIUM: 2 mg/dL (ref 1.7–2.4)

## 2017-01-23 LAB — TROPONIN I
Troponin I: 0.04 ng/mL (ref <0.03)
Troponin I: <0.03 ng/mL (ref <0.03)

## 2017-01-23 LAB — HIV ANTIBODY (ROUTINE TESTING W REFLEX): HIV SCREEN 4TH GENERATION: NONREACTIVE

## 2017-01-23 MED ORDER — METOLAZONE 5 MG PO TABS: 5.0000 mg | ORAL_TABLET | Freq: Every day | ORAL | Status: DC

## 2017-01-23 MED ORDER — PERFLUTREN LIPID MICROSPHERE
1.0000 mL | INTRAVENOUS | Status: AC | PRN
Start: 2017-01-23 — End: 2017-01-23
  Administered 2017-01-23: 2 mL via INTRAVENOUS
  Filled 2017-01-23 (×2): qty 10

## 2017-01-23 MED ORDER — POTASSIUM CHLORIDE 20 MEQ/15ML (10%) PO SOLN
40.0000 meq | Freq: Once | ORAL | Status: AC
  Administered 2017-01-23: 40 meq via ORAL
  Filled 2017-01-23: qty 30

## 2017-01-23 MED ORDER — ISOSORBIDE MONONITRATE ER 30 MG PO TB24
15.0000 mg | ORAL_TABLET | Freq: Every day | ORAL | Status: DC
  Administered 2017-01-24 – 2017-01-30 (×5): 15 mg via ORAL
  Filled 2017-01-23 (×7): qty 1

## 2017-01-23 MED ORDER — POTASSIUM CHLORIDE CRYS ER 20 MEQ PO TBCR
40.0000 meq | EXTENDED_RELEASE_TABLET | ORAL | Status: DC
Start: 2017-01-23 — End: 2017-01-23

## 2017-01-23 MED ORDER — SPIRONOLACTONE 25 MG PO TABS
25.0000 mg | ORAL_TABLET | Freq: Every day | ORAL | Status: DC
  Administered 2017-01-23 – 2017-01-27 (×5): 25 mg via ORAL
  Filled 2017-01-23 (×5): qty 1

## 2017-01-23 MED ORDER — HYDRALAZINE HCL 10 MG PO TABS
10.0000 mg | ORAL_TABLET | Freq: Two times a day (BID) | ORAL | Status: DC
  Administered 2017-01-23 – 2017-01-30 (×13): 10 mg via ORAL
  Filled 2017-01-23 (×13): qty 1

## 2017-01-23 MED ORDER — METOPROLOL TARTRATE 12.5 MG HALF TABLET
12.5000 mg | ORAL_TABLET | Freq: Two times a day (BID) | ORAL | Status: DC
  Administered 2017-01-23 – 2017-01-30 (×13): 12.5 mg via ORAL
  Filled 2017-01-23 (×13): qty 1

## 2017-01-23 NOTE — Progress Notes (Signed)
CRITICAL VALUE ALERT  Critical Value:  Troponin = 0.04  Date & Time Notied:  01/23/17 @ 1706  Provider Notified: Dr. Susie CassetteAbrol @ 1746  Orders Received/Actions taken: Awaiting

## 2017-01-23 NOTE — Consult Note (Signed)
   [facility] CM Inpatient Consult   01/23/2017  Sue Martin Oct 23, 1965 [id number]  Patient assess for HF exacerbation in the [facility] ACO.  Met with the patient at bedside. Patient endorses her primary care provider is Dr. Colin Benton with [facility].  Patient states since she is a bilateral amputee, she monitors changes in her weight by her 3 pair shorts.  She states she has a normal size for her, a size bigger and one that is two sides bigger and she lets her physician know she is retaining too much fluid. Patient states she uses [employer] for transportation. She does express she may need transportation from the hospital. She uses [facility] for her pharmacy.  Patient states she feels she could benefit from therapy when she gets home.  Patient accepted a brochure with contact information for needs.  Her primary care practice generally provide the transition of care calls and follow up after the patient is discharged. [facility] Care Management does not interfere with or replace any services needed.  For questions, please contact:  Natividad Brood, RN BSN [facility] Liaison  [phone] business mobile phone Toll free office [phone]

## 2017-01-23 NOTE — Progress Notes (Signed)
Pt stated " I'm having a pain in my bronchial tubes pain scale 7/10 everytime I breath". This RN made clarification if its a chest pain, pt stated " its not a chest pain ,its in my bronchial tubes". Mor[phine IV given as prn order. Will continue to monitor pt.    01/23/17 2229  Vitals  BP (!) 108/57  BP Location Left Arm  BP Method Automatic  Resp 18  Oxygen Therapy  SpO2 97 %  O2 Device Nasal Cannula  O2 Flow Rate (L/min) 2 L/min

## 2017-01-23 NOTE — Progress Notes (Signed)
PROGRESS NOTE  Sue Martin HQP:[id number] DOB: 01/17/1966 DOA: 01/22/2017 PCP: Lucretia Kern, DO  HPI/Recap of past 24 hours:  Feeling sob, chest tightness, report swelling is improving  Assessment/Plan: Principal Problem:   Acute on chronic systolic CHF (congestive heart failure) (HCC) Active Problems:   Type 2 diabetes mellitus with diabetic nephropathy (HCC)   Cirrhosis of liver without ascites (HCC)   S/P bilateral BKA (below knee amputation) (HCC)   CAD (coronary artery disease)   CKD (chronic kidney disease) stage 3, GFR 30-59 ml/min   Hyperlipidemia   Chest pain   Acute on chronic respiratory failure with hypoxia (HCC)   Thrombocytopenia (HCC)   Acute respiratory failure with hypoxia due to acute on chronic systolic CHF (baseline not o2 dependent): - Patient has weight gain, leg edema, pulmonary edema on chest x-ray, elevated BNP, consistent with CHF exacerbation.  -2-D echo on 11/06/09 showed EF of 39%, repeated 2-D echo on 03/27/15 basically showed no significant change. -bnp 164 in 2016, bnp on admission 474, troponin negative on admission, ekg no acute st/t changes -repeat echo pending -home meds demadex (86m bid) held, she is started on iv lasix 865mbid since admission, continue spironolactone/ zaroxolyn home dose, bp low normal, hold lisinopril, decrease hydralazine/imdur/betablocker dose,  -strict intake and output, daily weight, wean o2 as tolerated   Chest pain and hx of CAD: s/p stent. Patient's chest pain has resolved currently. Most likely due to demand ischemia secondary to CHF exacerbation. -ekg no acute st/t changes -When necessary morphine and nitroglycerin for pain -On aspirin/ Lipitor/betablocker - Follow-up troponin 3, 2-D echo  Hypokalemia: K=3.0 on admission. - k2.9 on 7/30, continue replete -  Mg 2.  Nausea, vomiting:  no diarrhea. Patient does not have abdominal pain at resting, but vomiting induced some abdominal pain. On physical  examination, patient seems to have abdominal wall tenderness on examination which is likely due to abdominal swelling. -lipase wnl, tbili elevated, sat/alt alk pho wnl N/v possible from gi  Congestion from chf -When necessary Zofran for nausea.   Cirrhosis [facility] liver without ascites ([facility] Mental status normal. -ammonia level  Elevated at 53, tbili elevated at 4.6  - repeat ammonia and LFT  Thrombocytopenia ([facility] This is a chronic issue. Most likely due to liver cirrhosis. No bleeding tendency. plt hs progressive gotten worse  Insulin dependent DM-II: Last A1c 8.7, poorly controled. Patient is taking metformin, NovoLog, degludec insulin at home -will decrease Lantus dose from 30-->20 units daily  -SSI  CKD-III: Stable. Baseline creatinine 1.1-1.3. Her creatinine is 1.33, BUN 2017. -Follow up renal function by BMP  HLD: -lipitor  Bilateral amputee/ wheelchair dependent and visual impairment at baseline from retinal detachment.  Body mass index is 57.97 kg/m.     DVT ppx: SCD Code Status: Full code Family Communication: None at bed side.  husband over the phone on 7/30 Disposition Plan:  Anticipate discharge back to previous home environment, likely will need home health Consults called:  none   Procedures:  none  Antibiotics:  none   Objective: BP 130/66 (BP Location: Left Arm)   Pulse 92   Temp (!) 97.4 F (36.3 C) (Oral)   Resp 20   Ht _0  (1.397 m)   Wt 113.1 kg (249 lb 6.4 oz)   SpO2 96%   BMI 57.97 kg/m   Intake/Output Summary (Last 24 hours) at 01/23/17 0804 Last data filed at 01/23/17 0507  Gross per 24 hour  Intake  490 ml  Output              550 ml  Net              -60 ml   Filed Weights   01/22/17 1610 01/23/17 0457  Weight: 136.1 kg (300 lb) 113.1 kg (249 lb 6.4 oz)    Exam:   General:  NAD, obses  Cardiovascular: RRR  Respiratory: scatters crackles, no wheezing, no rhonchi  Abdomen: Soft/ND/NT, positive  BS, + abdominal wall edema  Musculoskeletal: bilateral bka, Edema is imrpoving  Neuro: aaox3  Data Reviewed: Basic Metabolic Panel:  Recent Labs Lab 01/22/17 1723 01/23/17 0603  NA 141 141  K 3.0* 2.9*  CL 96* 94*  CO2 37* 37*  GLUCOSE 88 106*  BUN 27* 27*  CREATININE 1.33* 1.39*  CALCIUM 9.4 9.4  MG  --  2.0   Liver Function Tests:  Recent Labs Lab 01/22/17 2126  AST 22  ALT 10*  ALKPHOS 108  BILITOT 4.6*  PROT 6.8  ALBUMIN 3.3*    Recent Labs Lab 01/22/17 2126  LIPASE 31    Recent Labs Lab 01/22/17 2126  AMMONIA 53*   CBC:  Recent Labs Lab 01/22/17 1723  WBC 6.4  HGB 16.5*  HCT 49.6*  MCV 93.4  PLT 79*   Cardiac Enzymes:   No results for input(s): CKTOTAL, CKMB, CKMBINDEX, TROPONINI in the last 168 hours. BNP (last 3 results)  Recent Labs  01/22/17 1723  BNP 474.8*    ProBNP (last 3 results) No results for input(s): PROBNP in the last 8760 hours.  CBG:  Recent Labs Lab 01/22/17 2157 01/23/17 0732  GLUCAP 68 154*    No results found for this or any previous visit (from the past 240 hour(s)).   Studies: Dg Chest 2 View  Result Date: 01/22/2017 CLINICAL DATA:  Patient with chest pain and shortness of breath for multiple days. EXAM: CHEST  2 VIEW COMPARISON:  Chest radiograph 09/13/2016. FINDINGS: Cardiomegaly. Bilateral interstitial pulmonary opacities. Trace bilateral pleural effusions. Thoracic spine degenerative changes. IMPRESSION: Cardiomegaly and interstitial pulmonary edema. Electronically Signed   By: Lovey Newcomer M.D.   On: 01/22/2017 18:45    Scheduled Meds: . aspirin  325 mg Oral QHS  . atorvastatin  20 mg Oral Daily  . furosemide  80 mg Intravenous Q12H  . hydrALAZINE  10 mg Oral TID  . insulin aspart  0-9 Units Subcutaneous TID WC  . insulin glargine  20 Units Subcutaneous Q breakfast  . isosorbide mononitrate  30 mg Oral Daily  . potassium chloride  40 mEq Oral Daily  . potassium chloride SA  40 mEq Oral Once   . potassium chloride  40 mEq Oral Q4H  . sodium chloride flush  3 mL Intravenous Q12H    Continuous Infusions: . sodium chloride       Time spent: 49mns  Kailia Starry MD, PhD  [facility] Pager [phone] If 7PM-7AM, please contact night-coverage at [email or web address], password [facility]7/30/2018, 8:04 AM  LOS: 1 day

## 2017-01-23 NOTE — Progress Notes (Signed)
  Echocardiogram 2D Echocardiogram has been performed.  Arvil ChacoFoster, Philisha Weinel 01/23/2017, 3:20 PM

## 2017-01-24 LAB — HEMOGLOBIN A1C
HEMOGLOBIN A1C: 8.4 % — AB (ref 4.8–5.6)
MEAN PLASMA GLUCOSE: 194 mg/dL

## 2017-01-24 LAB — HEPATIC FUNCTION PANEL
ALBUMIN: 3.1 g/dL — AB (ref 3.5–5.0)
ALT: 11 U/L — ABNORMAL LOW (ref 14–54)
AST: 19 U/L (ref 15–41)
Alkaline Phosphatase: 101 U/L (ref 38–126)
BILIRUBIN TOTAL: 4 mg/dL — AB (ref 0.3–1.2)
Bilirubin, Direct: 1.7 mg/dL — ABNORMAL HIGH (ref 0.1–0.5)
Indirect Bilirubin: 2.3 mg/dL — ABNORMAL HIGH (ref 0.3–0.9)
TOTAL PROTEIN: 6 g/dL — AB (ref 6.5–8.1)

## 2017-01-24 LAB — GLUCOSE, CAPILLARY
GLUCOSE-CAPILLARY: 145 mg/dL — AB (ref 65–99)
GLUCOSE-CAPILLARY: 209 mg/dL — AB (ref 65–99)
Glucose-Capillary: 274 mg/dL — ABNORMAL HIGH (ref 65–99)
Glucose-Capillary: 215 mg/dL — ABNORMAL HIGH (ref 65–99)

## 2017-01-24 LAB — BASIC METABOLIC PANEL
Anion gap: 10 (ref 5–15)
BUN: 30 mg/dL — ABNORMAL HIGH (ref 6–20)
CHLORIDE: 95 mmol/L — AB (ref 101–111)
CO2: 34 mmol/L — ABNORMAL HIGH (ref 22–32)
CREATININE: 1.65 mg/dL — AB (ref 0.44–1.00)
Calcium: 8.9 mg/dL (ref 8.9–10.3)
GFR, EST AFRICAN AMERICAN: 41 mL/min — AB (ref >60)
GFR, EST NON AFRICAN AMERICAN: 35 mL/min — AB (ref >60)
Glucose, Bld: 191 mg/dL — ABNORMAL HIGH (ref 65–99)
POTASSIUM: 3.8 mmol/L (ref 3.5–5.1)
Sodium: 139 mmol/L (ref 135–145)

## 2017-01-24 LAB — CBC
HEMATOCRIT: 45.8 % (ref 36.0–46.0)
Hemoglobin: 14.4 g/dL (ref 12.0–15.0)
MCH: 30 pg (ref 26.0–34.0)
MCHC: 31.4 g/dL (ref 30.0–36.0)
MCV: 95.4 fL (ref 78.0–100.0)
Platelets: 86 10*3/uL — ABNORMAL LOW (ref 150–400)
RBC: 4.8 MIL/uL (ref 3.87–5.11)
RDW: 15.9 % — AB (ref 11.5–15.5)
WBC: 6 10*3/uL (ref 4.0–10.5)

## 2017-01-24 LAB — MAGNESIUM: MAGNESIUM: 1.9 mg/dL (ref 1.7–2.4)

## 2017-01-24 MED ORDER — ENOXAPARIN SODIUM 40 MG/0.4ML ~~LOC~~ SOLN
40.0000 mg | SUBCUTANEOUS | Status: DC
  Filled 2017-01-24: qty 0.4

## 2017-01-24 NOTE — Progress Notes (Signed)
Inpatient Diabetes Program Recommendations  AACE/ADA: New Consensus Statement on Inpatient Glycemic Control (2015)  Target Ranges:  Prepandial:   less than 140 mg/dL      Peak postprandial:   less than 180 mg/dL (1-2 hours)      Critically ill patients:  140 - 180 mg/dL   Results for Sue Martin, Sue Martin (MRN [id number]) as of 01/24/2017 10:14  Ref. Range 01/23/2017 07:32 01/23/2017 12:22 01/23/2017 16:41 01/23/2017 20:53 01/24/2017 07:37  Glucose-Capillary Latest Ref Range: 65 - 99 mg/dL 829154 (H) 562200 (H) 130179 (H) 210 (H) 145 (H)   Review of Glycemic Control  Diabetes history: DM2 Outpatient Diabetes medications: Tresiba 30 untis QAM, Novolog 35 units TID with meals Current orders for Inpatient glycemic control: Lantus 20 units QAM, Novolog 0-9 units TID with meals  Inpatient Diabetes Program Recommendations: Correction (SSI): Please consider ordering Novolog 0-5 units QHS for bedtime correction scale. Insulin - Meal Coverage: If post prandial glucose remains greater than 180 mg/dl, please consider ordering Novolog 3 units TID with meals for meal coverage if patient eats at least 50% of meals.  Thanks, Orlando PennerMarie Salvador Coupe, RN, MSN, CDE Diabetes Coordinator Inpatient Diabetes Program [phone] (Team Pager from 8am to 5pm)

## 2017-01-24 NOTE — Progress Notes (Addendum)
PROGRESS NOTE  Makalyn Zehring RXV:[id number] DOB: 1966-06-05 DOA: 01/22/2017 PCP: Lucretia Kern, DO   Brief Summary:   Hx of CAD, stent placement, sCHF with EF 39%,  liver cirrhosis, thrombocytopenia, insulin dependent dm2, bilateral BKA, retinal detachment,  who presents with chest tightness, sob, edema, cxr consistent with chf, negative troponin-->IV lasix.  F/u v/Q scan/doppler of bilateral stump    HPI/Recap of past 24 hours:  Urine output 1.8liter last 24hrs She is talking to her husband on the phone when I entered her room, she reports feeling better,   report swelling is improving, no n/v. She wants to take off oxygen supplement  Assessment/Plan: Principal Problem:   Acute on chronic systolic CHF (congestive heart failure) (HCC) Active Problems:   Type 2 diabetes mellitus with diabetic nephropathy (HCC)   Cirrhosis of liver without ascites (HCC)   S/P bilateral BKA (below knee amputation) (HCC)   CAD (coronary artery disease)   CKD (chronic kidney disease) stage 3, GFR 30-59 ml/min   Hyperlipidemia   Chest pain   Acute on chronic respiratory failure with hypoxia (HCC)   Thrombocytopenia (HCC)   Acute respiratory failure with hypoxia due to acute on chronic combined systolic/diastolic CHF (baseline not o2 dependent): - Patient has weight gain, stump and abdominal wall edema, pulmonary edema on chest x-ray, consistent with CHF exacerbation.  --bnp 164 in 2016, bnp on admission 474, troponin negative x3, ekg no acute st/t changes -repeat echo LVEF 30-35%, Doppler parameters are consistent with a reversible restrictive pattern, indicative of decreased left ventricular diastolic compliance and/or increased left atrial pressure (grade 3 diastolic dysfunction). Moderate Pulmonic valve regurgitation -home meds demadex (71m bid) and zaroxolyn held, she is started on iv lasix 862mbid since admission, continue spironolactone, bp low normal, hold lisinopril, decrease  hydralazine/imdur/betablocker dose,  - RN reports patient's o2 sats dropped to the 70's on room air on 7/31, she is put back on oxygen supplement, Though patient looks ok and reports feeling better -will get stump venous doppler and v/q scan to r/o thromboemboli , continue diuresis, monitor I/o's, daily weight, cr and lytes. wean oxygen as tolerate.    Chest pain and hx of CAD: s/p stent. Patient's chest pain has resolved currently. Most likely due to demand ischemia secondary to CHF exacerbation. -ekg no acute st/t changes -When necessary morphine and nitroglycerin for pain -On aspirin/ Lipitor/betablocker -  troponin negative 3 , 2-D echo "Diffuse hypokinesis. There is akinesis of the apical myocardium"  Hypokalemia:  -  repleted -  Mg 2.  Nausea, vomiting (presenting complaints):  no diarrhea. Patient does not have abdominal pain at resting, but vomiting induced some abdominal pain. On physical examination, patient seems to have abdominal wall tenderness on examination which is likely due to abdominal swelling. -lipase wnl, tbili elevated, sat/alt alk pho wnl -N/v possible from gi  Congestion from chf -resolved   Cirrhosis of liver without ascites ([facility]  -ammonia level  Elevated at 53 on admission though no confusion, tbili elevated at 4.6  - repeat LFT  Thrombocytopenia ([facility] This is a chronic issue. Most likely due to liver cirrhosis. No bleeding tendency. Platelet 79-86 since admission, was 115 in 08/2016 Will r/o thromboembolism. Continue monitor platele  Insulin dependent DM-II: Last A1c 8.7, poorly controled. Patient is taking metformin, NovoLog, degludec insulin at home -will decrease Lantus dose from 30-->20 units daily  -SSI  CKD-III: Stable. Baseline creatinine 1.1-1.3. Her creatinine is 1.33, BUN 2017. -cr slightly increased to 1.65 on 7/31, repeat  bmp in am, may need to back down lasix if cr continue to rise.  HLD: -lipitor  Bilateral amputee/  wheelchair dependent and visual impairment at baseline from retinal detachment.  Body mass index is 57.48 kg/m.     DVT ppx: patient is double amputee, not able to use scd, lovenox 16m subq, monitor platelet count Code Status: Full code Family Communication: None at bed side.  husband over the phone on 7/30 and 7/31 Disposition Plan:  Anticipate discharge back to previous home environment, likely will need home health Consults called:  none   Procedures:  none  Antibiotics:  none   Objective: BP 105/62 (BP Location: Right Arm)   Pulse 75   Temp (!) 97.5 F (36.4 C) (Oral)   Resp 18   Ht '4\' 7"'  (1.397 m)   Wt 112.2 kg (247 lb 4.8 oz)   SpO2 92%   BMI 57.48 kg/m   Intake/Output Summary (Last 24 hours) at 01/24/17 0730 Last data filed at 01/24/17 0356  Gross per 24 hour  Intake             1200 ml  Output             1800 ml  Net             -600 ml   Filed Weights   01/22/17 1610 01/23/17 0457 01/24/17 0355  Weight: 136.1 kg (300 lb) 113.1 kg (249 lb 6.4 oz) 112.2 kg (247 lb 4.8 oz)    Exam:   General:  NAD, obses  Cardiovascular: RRR  Respiratory: overall diminished, previously heard scatters crackles has resolved, no wheezing, no rhonchi  Abdomen: Soft/ND/NT, positive BS, + abdominal wall edema,   Musculoskeletal: stump Edema has improved  Neuro: aaox3  Data Reviewed: Basic Metabolic Panel:  Recent Labs Lab 01/22/17 1723 01/23/17 0603 01/24/17 0329  NA 141 141 139  K 3.0* 2.9* 3.8  CL 96* 94* 95*  CO2 37* 37* 34*  GLUCOSE 88 106* 191*  BUN 27* 27* 30*  CREATININE 1.33* 1.39* 1.65*  CALCIUM 9.4 9.4 8.9  MG  --  2.0 1.9   Liver Function Tests:  Recent Labs Lab 01/22/17 2126 01/24/17 0329  AST 22 19  ALT 10* 11*  ALKPHOS 108 101  BILITOT 4.6* 4.0*  PROT 6.8 6.0*  ALBUMIN 3.3* 3.1*    Recent Labs Lab 01/22/17 2126  LIPASE 31    Recent Labs Lab 01/22/17 2126  AMMONIA 53*   CBC:  Recent Labs Lab 01/22/17 1723  01/24/17 0329  WBC 6.4 6.0  HGB 16.5* 14.4  HCT 49.6* 45.8  MCV 93.4 95.4  PLT 79* 86*   Cardiac Enzymes:    Recent Labs Lab 01/23/17 1531 01/23/17 1937  TROPONINI 0.04* <0.03   BNP (last 3 results)  Recent Labs  01/22/17 1723  BNP 474.8*    ProBNP (last 3 results) No results for input(s): PROBNP in the last 8760 hours.  CBG:  Recent Labs Lab 01/22/17 2157 01/23/17 0732 01/23/17 1222 01/23/17 1641 01/23/17 2053  GLUCAP 68 154* 200* 179* 210*    No results found for this or any previous visit (from the past 240 hour(s)).   Studies: No results found.  Scheduled Meds: . aspirin  325 mg Oral QHS  . atorvastatin  20 mg Oral Daily  . enoxaparin (LOVENOX) injection  40 mg Subcutaneous Q24H  . furosemide  80 mg Intravenous Q12H  . hydrALAZINE  10 mg Oral BID  . insulin aspart  0-9 Units Subcutaneous TID WC  . insulin glargine  20 Units Subcutaneous Q breakfast  . isosorbide mononitrate  15 mg Oral Daily  . metoprolol tartrate  12.5 mg Oral BID  . potassium chloride  40 mEq Oral Daily  . sodium chloride flush  3 mL Intravenous Q12H  . spironolactone  25 mg Oral QHS    Continuous Infusions: . sodium chloride       Time spent: 62mns  Lelani Garnett MD, PhD  [facility] Pager [phone] If 7PM-7AM, please contact night-coverage at [email or web address], password T[facility]7/31/2018, 7:30 AM  LOS: 2 days

## 2017-01-25 ENCOUNTER — Inpatient Hospital Stay (HOSPITAL_COMMUNITY): Payer: Medicare HMO

## 2017-01-25 DIAGNOSIS — R609 Edema, unspecified: Secondary | ICD-10-CM

## 2017-01-25 LAB — BASIC METABOLIC PANEL
ANION GAP: 9 (ref 5–15)
BUN: 35 mg/dL — ABNORMAL HIGH (ref 6–20)
CALCIUM: 9.2 mg/dL (ref 8.9–10.3)
CO2: 35 mmol/L — ABNORMAL HIGH (ref 22–32)
Chloride: 94 mmol/L — ABNORMAL LOW (ref 101–111)
Creatinine, Ser: 1.85 mg/dL — ABNORMAL HIGH (ref 0.44–1.00)
GFR, EST AFRICAN AMERICAN: 36 mL/min — AB (ref >60)
GFR, EST NON AFRICAN AMERICAN: 31 mL/min — AB (ref >60)
GLUCOSE: 199 mg/dL — AB (ref 65–99)
Potassium: 3.8 mmol/L (ref 3.5–5.1)
SODIUM: 138 mmol/L (ref 135–145)

## 2017-01-25 LAB — HEPATIC FUNCTION PANEL
ALBUMIN: 3.2 g/dL — AB (ref 3.5–5.0)
ALK PHOS: 115 U/L (ref 38–126)
ALT: 10 U/L — ABNORMAL LOW (ref 14–54)
AST: 20 U/L (ref 15–41)
Bilirubin, Direct: 1.7 mg/dL — ABNORMAL HIGH (ref 0.1–0.5)
Indirect Bilirubin: 2.1 mg/dL — ABNORMAL HIGH (ref 0.3–0.9)
Total Bilirubin: 3.8 mg/dL — ABNORMAL HIGH (ref 0.3–1.2)
Total Protein: 6.7 g/dL (ref 6.5–8.1)

## 2017-01-25 LAB — GLUCOSE, CAPILLARY
GLUCOSE-CAPILLARY: 174 mg/dL — AB (ref 65–99)
GLUCOSE-CAPILLARY: 249 mg/dL — AB (ref 65–99)
GLUCOSE-CAPILLARY: 228 mg/dL — AB (ref 65–99)
GLUCOSE-CAPILLARY: 198 mg/dL — AB (ref 65–99)

## 2017-01-25 LAB — URINALYSIS, ROUTINE W REFLEX MICROSCOPIC
Bilirubin Urine: NEGATIVE
GLUCOSE, UA: NEGATIVE mg/dL
KETONES UR: NEGATIVE mg/dL
Leukocytes, UA: NEGATIVE
Nitrite: NEGATIVE
PROTEIN: NEGATIVE mg/dL
Specific Gravity, Urine: 1.012 (ref 1.005–1.030)
pH: 5 (ref 5.0–8.0)

## 2017-01-25 LAB — CBC
HCT: 47 % — ABNORMAL HIGH (ref 36.0–46.0)
HEMOGLOBIN: 14.9 g/dL (ref 12.0–15.0)
MCH: 30.4 pg (ref 26.0–34.0)
MCHC: 31.7 g/dL (ref 30.0–36.0)
MCV: 95.9 fL (ref 78.0–100.0)
Platelets: 95 10*3/uL — ABNORMAL LOW (ref 150–400)
RBC: 4.9 MIL/uL (ref 3.87–5.11)
RDW: 15.8 % — ABNORMAL HIGH (ref 11.5–15.5)
WBC: 6.3 10*3/uL (ref 4.0–10.5)

## 2017-01-25 LAB — HEPARIN LEVEL (UNFRACTIONATED): HEPARIN UNFRACTIONATED: 0.47 [IU]/mL (ref 0.30–0.70)

## 2017-01-25 MED ORDER — HEPARIN BOLUS VIA INFUSION
4000.0000 [IU] | Freq: Once | INTRAVENOUS | Status: AC
Start: 2017-01-25 — End: 2017-01-25
  Administered 2017-01-25: 4000 [IU] via INTRAVENOUS
  Filled 2017-01-25: qty 4000

## 2017-01-25 MED ORDER — HEPARIN (PORCINE) IN NACL 100-0.45 UNIT/ML-% IJ SOLN
1200.0000 [IU]/h | INTRAMUSCULAR | Status: DC
  Administered 2017-01-25 – 2017-01-26 (×2): 1200 [IU]/h via INTRAVENOUS
  Filled 2017-01-25 (×2): qty 250

## 2017-01-25 MED ORDER — OXYMETAZOLINE HCL 0.05 % NA SOLN
3.0000 | Freq: Two times a day (BID) | NASAL | Status: DC | PRN
  Filled 2017-01-25: qty 15

## 2017-01-25 NOTE — Progress Notes (Addendum)
ANTICOAGULATION CONSULT NOTE - Initial Consult  Pharmacy Consult for Heparin Indication:  Rule out PE  Allergies  Allergen Reactions  . Broccoli [Brassica Oleracea Italica] Anaphylaxis  . Influenza Vaccines Anaphylaxis    Per patient  . Pneumococcal Vaccines Anaphylaxis    Per patient  . Trulicity [Dulaglutide] Hives and Nausea And Vomiting  . Amoxicillin Nausea And Vomiting  . Doxycycline Hives and Nausea And Vomiting  . Iron Other (See Comments)    GI intolerance  . Latex Other (See Comments)    blisters  . Lisinopril Nausea Only  . Penicillins Other (See Comments)    GI intolerance, UTI, can tolerate in IV Tolerates cephalosporins  . Tape Other (See Comments)    Tears skin.  Please use "paper" tape  . Codeine Nausea And Vomiting and Rash  . Sulfa Antibiotics Nausea And Vomiting and Rash  . Sulfur Nausea And Vomiting and Rash    Patient Measurements: Height: 4\' 7"  (139.7 cm) Weight: 234 lb (106.1 kg) IBW/kg (Calculated) : 34 kg (BKA) IBW 74kg at Ht 63in Heparin Dosing Weight:  Used ~75 kg   Vital Signs: Temp: 97.6 F (36.4 C) (08/01 0514) Temp Source: Oral (08/01 0514) BP: 105/75 (08/01 0514) Pulse Rate: 65 (08/01 0514)  Labs:  Recent Labs  01/22/17 1723 01/23/17 0603 01/23/17 1531 01/23/17 1937 01/24/17 0329 01/25/17 0243  HGB 16.5*  --   --   --  14.4 14.9  HCT 49.6*  --   --   --  45.8 47.0*  PLT 79*  --   --   --  86* 95*  CREATININE 1.33* 1.39*  --   --  1.65* 1.85*  TROPONINI  --   --  0.04* <0.03  --   --     Estimated Creatinine Clearance: 36.1 mL/min (A) (by C-G formula based on SCr of 1.85 mg/dL (H)).   Medical History: Past Medical History:  Diagnosis Date  . CAD (coronary artery disease) 12/12/2014   -s/p 2V PCI of the LAD/RCA with taxus stents -cardiologist is Dr. Carmon SailsKwawaja, Celedonio SavageUsman   . CHF (congestive heart failure) (HCC)    sees Dr. Wynonia HazardKhawaja  . Chronic kidney disease   . Depression   . Diabetes mellitus with renal complications (HCC)     PVD and retinopathy, S/p bilat ambutations  . Glaucoma   . Liver cirrhosis (HCC)   . Lyme disease   . Retinal detachment    sees Dr. Johna SheriffPeter Rogaski per her report  . S/P bilateral BKA (below knee amputation) (HCC) 11/28/2014    Medications:  Prescriptions Prior to Admission  Medication Sig Dispense Refill Last Dose  . aspirin 325 MG tablet Take 325 mg by mouth at bedtime.    01/21/2017 at Unknown time  . atorvastatin (LIPITOR) 20 MG tablet Take 1 tablet (20 mg total) by mouth daily. (Patient taking differently: Take 20 mg by mouth every evening. ) 90 tablet 1 01/21/2017 at Unknown time  . cyclobenzaprine (FLEXERIL) 5 MG tablet Take 1 tablet (5 mg total) by mouth 2 (two) times daily as needed for muscle spasms. 30 tablet 1 Past Week at Unknown time  . ibuprofen (ADVIL,MOTRIN) 200 MG tablet Take 200 mg by mouth every 6 (six) hours as needed for mild pain.   Past Month at Unknown time  . insulin aspart (NOVOLOG) 100 UNIT/ML FlexPen Inject 35 Units into the skin 3 (three) times daily with meals. 30 mL 3 01/21/2017 at pm  . insulin degludec (TRESIBA FLEXTOUCH) 100 UNIT/ML SOPN  FlexTouch Pen Inject 0.4 mLs (40 Units total) into the skin daily with breakfast. (Patient taking differently: Inject 30 Units into the skin daily with breakfast. ) 5 pen 1 01/22/2017 at am  . isosorbide mononitrate (IMDUR) 30 MG 24 hr tablet Take 30 mg by mouth daily.    01/21/2017 at Unknown time  . lisinopril (PRINIVIL,ZESTRIL) 10 MG tablet Take 10 mg by mouth at bedtime.    01/21/2017 at Unknown time  . metolazone (ZAROXOLYN) 5 MG tablet TAKE ONE TABLET BY MOUTH ONCE DAILY (30  MINUTES  BEFORE  DEMADEX  DOSE) (Patient taking differently: Take 5mg s at night) 30 tablet 11 01/21/2017 at Unknown time  . metoprolol succinate (TOPROL-XL) 25 MG 24 hr tablet Take 25 mg by mouth at bedtime.   01/21/2017 at pm  . potassium chloride 20 MEQ/15ML (10%) SOLN Take 30 mEq by mouth 2 (two) times daily.    01/21/2017 at Unknown time  .  spironolactone (ALDACTONE) 25 MG tablet Take 1 tablet (25 mg total) by mouth daily. (Patient taking differently: Take 25 mg by mouth at bedtime. ) 90 tablet 3 Past Month at Unknown time  . torsemide (DEMADEX) 20 MG tablet TAKE ONE TABLET BY MOUTH TWICE DAILY 60 tablet 5 01/21/2017 at Unknown time  . hydrALAZINE (APRESOLINE) 10 MG tablet Take 1 tablet (10 mg total) by mouth 3 (three) times daily. 270 tablet 3 07/06/2016 at Unknown time   Scheduled:  . aspirin  325 mg Oral QHS  . atorvastatin  20 mg Oral Daily  . furosemide  80 mg Intravenous Q12H  . hydrALAZINE  10 mg Oral BID  . insulin aspart  0-9 Units Subcutaneous TID WC  . insulin glargine  20 Units Subcutaneous Q breakfast  . isosorbide mononitrate  15 mg Oral Daily  . metoprolol tartrate  12.5 mg Oral BID  . potassium chloride  40 mEq Oral Daily  . sodium chloride flush  3 mL Intravenous Q12H  . spironolactone  25 mg Oral QHS    Assessment: 51 y.o female with h/o CAD, stent placement, sCHF with EF 39%,  liver cirrhosis, thrombocytopenia, CKD stage III,  insulin dependent dm2, bilateral BKA, retinal detachment, obesity (BMI 57.48 kg/m2), [facility] tightness, SOB, edema. pltc 94k, Thrombocytopenia-chronic issue, possibly due to liver cirrhosis. No bleeding tendency. Platelet 79-86 since admission, was 115 in 08/2016.  MD plan to  r/o thromboembolism.  Pharmacy consulted to start IV heparin infusion until PE ruled out.   Troponin negative x3,Not on anticoagulation PTA.   VTE prophylaxis lovenox was ordered on admission 7/31 but patient has refused doses.  LFTs wnl Bilateral LE venous duplex completed 01/25/17:  Negative VQ scan pending. No bleeding reported. Using dosing weight of 75 kg for heparin start due to low pltc.  Goal of Therapy:  Heparin level 0.3-0.7 units/ml Monitor platelets by anticoagulation protocol: Yes   Plan:  Heparin bolus 4000 units IV x1 Heparin drip 1200 units/hr 6  Hour heparin  level Daily HL, CBC  Noah Delaineuth Orva Gwaltney, RPh Clinical Pharmacist Pager: [phone]-[phone] [phone] Main Pharmacy [phone]#[id number] 01/25/2017,11:57 AM

## 2017-01-25 NOTE — Progress Notes (Signed)
PROGRESS NOTE  Sue Martin ZOX:[id number] DOB: 03-16-1966 DOA: 01/22/2017 PCP: Terressa KoyanagiKim, [name], DO   Brief Summary:   Hx of CAD, stent placement, sCHF with EF 39%,  liver cirrhosis, thrombocytopenia, insulin dependent dm2, bilateral BKA, retinal detachment,  who presents with chest tightness, sob, edema, cxr consistent with chf, negative troponin-->IV lasix.  F/u v/Q scan/doppler of bilateral stump  HPI/Recap of past 24 hours: Pt has no new complaints. Was unable to lay flat to get VQ scan as patient reportedly turned purple per reports. As such test aborted.  Assessment/Plan: Principal Problem:   Acute on chronic systolic CHF (congestive heart failure) (HCC) Active Problems:   Type 2 diabetes mellitus with diabetic nephropathy (HCC)   Cirrhosis of liver without ascites (HCC)   S/P bilateral BKA (below knee amputation) (HCC)   CAD (coronary artery disease)   CKD (chronic kidney disease) stage 3, GFR 30-59 ml/min   Hyperlipidemia   Chest pain   Acute on chronic respiratory failure with hypoxia (HCC)   Thrombocytopenia (HCC)   Acute respiratory failure with hypoxia due to acute on chronic combined systolic/diastolic CHF (baseline not o2 dependent): -Presumed to be secondary to CHF exacerbation. Continue current regimen. Patient is down 1 L. Continue fluid restriction. Down 6 kg per daily weights. We'll continue to monitor - Unable to get VQ scan as such placed on heparin until able to obtain test. Dopplers of lower extremity stumps were negative    Chest pain and hx of CAD: s/p stent. Patient's chest pain has resolved currently. Most likely due to demand ischemia secondary to CHF exacerbation. - ekg no acute st/t changes - When necessary morphine and nitroglycerin for pain - On aspirin/ Lipitor/betablocker - troponin negative 3 , 2-D echo "Diffuse hypokinesis. There is akinesis of the apical myocardium" - resolving, no complaints reported to me today.  Hypokalemia:  -   repleted -  Mg 2.  Nausea, vomiting (presenting complaints):   -resolved   Cirrhosis of liver without ascites (HCC):  -ammonia level  Elevated at 53 on admission though no confusion, tbili elevated at 3.8, trending down from last check.  - repeat LFT reporting normal values with ALT below normal values.  Thrombocytopenia (HCC): This is a chronic issue. Most likely due to liver cirrhosis. No bleeding tendency. Platelet 79-86 since admission, was 115 in 08/2016 Will r/o thromboembolism. Last platelet count 95. We'll have to be careful since patient is on heparin. Reassess next a.m.  Insulin dependent DM-II: Last A1c 8.7, poorly controled.  - continue current lantus dose. -SSI  CKD-III: Stable. Baseline creatinine 1.1-1.3. Her creatinine is 1.33, BUN 2017. -Serum creatinine a little higher above baseline and on last check 1.8  HLD: -lipitor, stable no chest pain reported.  Bilateral amputee/ wheelchair dependent and visual impairment at baseline from retinal detachment.  Body mass index is 54.39 kg/m.   DVT ppx: patient is double amputee, not able to use scd, Heparin while unable to obtain VQ scan. Code Status: Full code Family Communication: None at bed side.  husband over the phone on 7/30 and 7/31 Disposition Plan:  Anticipate discharge back to previous home environment, likely will need home health Consults called:  none   Procedures:  none  Antibiotics:  none   Objective: BP (!) 77/58 (BP Location: Left Arm)   Pulse 70   Temp 97.6 F (36.4 C) (Oral)   Resp 20   Ht 4\' 7"  (1.397 m)   Wt 106.1 kg (234 lb)   SpO2 98%  BMI 54.39 kg/m   Intake/Output Summary (Last 24 hours) at 01/25/17 1508 Last data filed at 01/25/17 0827  Gross per 24 hour  Intake              600 ml  Output              500 ml  Net              100 ml   Filed Weights   01/23/17 0457 01/24/17 0355 01/25/17 0514  Weight: 113.1 kg (249 lb 6.4 oz) 112.2 kg (247 lb 4.8 oz) 106.1 kg  (234 lb)    Exam:   General:  Pt in nad, alert and awake  Cardiovascular: RRR, no gallops or rubs  Respiratory: overall diminished, + scatters crackles, no wheezing, no rhonchi  Abdomen: Soft/ND/NT, positive BS, + abdominal wall edema,   Musculoskeletal: stump Edema  Neuro: aaox3  Data Reviewed: Basic Metabolic Panel:  Recent Labs Lab 01/22/17 1723 01/23/17 0603 01/24/17 0329 01/25/17 0243  NA 141 141 139 138  K 3.0* 2.9* 3.8 3.8  CL 96* 94* 95* 94*  CO2 37* 37* 34* 35*  GLUCOSE 88 106* 191* 199*  BUN 27* 27* 30* 35*  CREATININE 1.33* 1.39* 1.65* 1.85*  CALCIUM 9.4 9.4 8.9 9.2  MG  --  2.0 1.9  --    Liver Function Tests:  Recent Labs Lab 01/22/17 2126 01/24/17 0329 01/25/17 0243  AST 22 19 20   ALT 10* 11* 10*  ALKPHOS 108 101 115  BILITOT 4.6* 4.0* 3.8*  PROT 6.8 6.0* 6.7  ALBUMIN 3.3* 3.1* 3.2*    Recent Labs Lab 01/22/17 2126  LIPASE 31    Recent Labs Lab 01/22/17 2126  AMMONIA 53*   CBC:  Recent Labs Lab 01/22/17 1723 01/24/17 0329 01/25/17 0243  WBC 6.4 6.0 6.3  HGB 16.5* 14.4 14.9  HCT 49.6* 45.8 47.0*  MCV 93.4 95.4 95.9  PLT 79* 86* 95*   Cardiac Enzymes:    Recent Labs Lab 01/23/17 1531 01/23/17 1937  TROPONINI 0.04* <0.03   BNP (last 3 results)  Recent Labs  01/22/17 1723  BNP 474.8*    ProBNP (last 3 results) No results for input(s): PROBNP in the last 8760 hours.  CBG:  Recent Labs Lab 01/24/17 1209 01/24/17 1620 01/24/17 2057 01/25/17 0738 01/25/17 1155  GLUCAP 209* 274* 215* 174* 249*    No results found for this or any previous visit (from the past 240 hour(s)).   Studies: No results found.  Scheduled Meds: . aspirin  325 mg Oral QHS  . atorvastatin  20 mg Oral Daily  . furosemide  80 mg Intravenous Q12H  . hydrALAZINE  10 mg Oral BID  . insulin aspart  0-9 Units Subcutaneous TID WC  . insulin glargine  20 Units Subcutaneous Q breakfast  . isosorbide mononitrate  15 mg Oral Daily  .  metoprolol tartrate  12.5 mg Oral BID  . potassium chloride  40 mEq Oral Daily  . sodium chloride flush  3 mL Intravenous Q12H  . spironolactone  25 mg Oral QHS    Continuous Infusions: . sodium chloride    . heparin 1,200 Units/hr (01/25/17 1320)     Time spent: 35mins  Penny PiaVEGA, Yaniah Thiemann MD, PhD  [facility] Pager [phone]. If 7PM-7AM, please contact night-coverage at [email or web address], password [email or web address] 01/25/2017, 3:08 PM  LOS: 3 days

## 2017-01-25 NOTE — Progress Notes (Signed)
**  Preliminary report by tech**  Bilateral lower extremity venous duplex completed. There is no evidence of deep or superficial vein thrombosis involving the right and left lower extremities. All visualized vessels appear patent and compressible. There is no evidence of Baker's cysts bilaterally.  01/25/17 11:30 AM Olen CordialGreg Kaylise Blakeley RVT

## 2017-01-25 NOTE — Progress Notes (Signed)
ANTICOAGULATION CONSULT NOTE - Follow Up Consult  Pharmacy Consult for Heparin Indication: Rule out PE  Allergies  Allergen Reactions  . Broccoli [Brassica Oleracea Italica] Anaphylaxis  . Influenza Vaccines Anaphylaxis    Per patient  . Pneumococcal Vaccines Anaphylaxis    Per patient  . Trulicity [Dulaglutide] Hives and Nausea And Vomiting  . Amoxicillin Nausea And Vomiting  . Doxycycline Hives and Nausea And Vomiting  . Iron Other (See Comments)    GI intolerance  . Latex Other (See Comments)    blisters  . Lisinopril Nausea Only  . Penicillins Other (See Comments)    GI intolerance, UTI, can tolerate in IV Tolerates cephalosporins  . Tape Other (See Comments)    Tears skin.  Please use "paper" tape  . Codeine Nausea And Vomiting and Rash  . Sulfa Antibiotics Nausea And Vomiting and Rash  . Sulfur Nausea And Vomiting and Rash    Patient Measurements: Height: 4\' 7"  (139.7 cm) Weight: 234 lb (106.1 kg) IBW/kg (Calculated) : 34 Heparin Dosing Weight: used ~75kg  Vital Signs: BP: 77/58 (08/01 1443) Pulse Rate: 70 (08/01 1443)  Labs:  Recent Labs  01/23/17 0603 01/23/17 1531 01/23/17 1937 01/24/17 0329 01/25/17 0243 01/25/17 1922  HGB  --   --   --  14.4 14.9  --   HCT  --   --   --  45.8 47.0*  --   PLT  --   --   --  86* 95*  --   HEPARINUNFRC  --   --   --   --   --  0.47  CREATININE 1.39*  --   --  1.65* 1.85*  --   TROPONINI  --  0.04* <0.03  --   --   --     Estimated Creatinine Clearance: 36.1 mL/min (A) (by C-G formula based on SCr of 1.85 mg/dL (H)).   Medications:  Heparin @ 1200 units/hr  Assessment: 51yof started on heparin earlier today for possible PE. VQ scan aborted because patient turned purple. Initial heparin level is therapeutic at 0.47. No bleeding reported.  Goal of Therapy:  Heparin level 0.3-0.7 units/ml Monitor platelets by anticoagulation protocol: Yes   Plan:  1) Continue heparin at 1200 units/hr 2) Check 6 hour  confirmatory heparin level   Fredrik RiggerMarkle, Jaidy Cottam Sue 01/25/2017,7:56 PM

## 2017-01-26 ENCOUNTER — Encounter (HOSPITAL_COMMUNITY): Payer: Self-pay

## 2017-01-26 LAB — BASIC METABOLIC PANEL
Anion gap: 11 (ref 5–15)
BUN: 38 mg/dL — ABNORMAL HIGH (ref 6–20)
CHLORIDE: 94 mmol/L — AB (ref 101–111)
CO2: 34 mmol/L — ABNORMAL HIGH (ref 22–32)
CREATININE: 1.87 mg/dL — AB (ref 0.44–1.00)
Calcium: 9.1 mg/dL (ref 8.9–10.3)
GFR, EST AFRICAN AMERICAN: 35 mL/min — AB (ref >60)
GFR, EST NON AFRICAN AMERICAN: 30 mL/min — AB (ref >60)
Glucose, Bld: 158 mg/dL — ABNORMAL HIGH (ref 65–99)
POTASSIUM: 4.1 mmol/L (ref 3.5–5.1)
SODIUM: 139 mmol/L (ref 135–145)

## 2017-01-26 LAB — CBC
HCT: 46 % (ref 36.0–46.0)
HEMATOCRIT: 45.4 % (ref 36.0–46.0)
HEMOGLOBIN: 14.4 g/dL (ref 12.0–15.0)
HEMOGLOBIN: 14.6 g/dL (ref 12.0–15.0)
MCH: 30.3 pg (ref 26.0–34.0)
MCH: 30.3 pg (ref 26.0–34.0)
MCHC: 31.7 g/dL (ref 30.0–36.0)
MCHC: 31.7 g/dL (ref 30.0–36.0)
MCV: 95.4 fL (ref 78.0–100.0)
MCV: 95.6 fL (ref 78.0–100.0)
PLATELETS: 85 10*3/uL — AB (ref 150–400)
Platelets: 84 10*3/uL — ABNORMAL LOW (ref 150–400)
RBC: 4.75 MIL/uL (ref 3.87–5.11)
RBC: 4.82 MIL/uL (ref 3.87–5.11)
RDW: 15.8 % — ABNORMAL HIGH (ref 11.5–15.5)
RDW: 15.8 % — AB (ref 11.5–15.5)
WBC: 5.8 10*3/uL (ref 4.0–10.5)
WBC: 5.4 10*3/uL (ref 4.0–10.5)

## 2017-01-26 LAB — GLUCOSE, CAPILLARY
GLUCOSE-CAPILLARY: 119 mg/dL — AB (ref 65–99)
GLUCOSE-CAPILLARY: 202 mg/dL — AB (ref 65–99)
Glucose-Capillary: 153 mg/dL — ABNORMAL HIGH (ref 65–99)
Glucose-Capillary: 204 mg/dL — ABNORMAL HIGH (ref 65–99)

## 2017-01-26 LAB — HEPARIN LEVEL (UNFRACTIONATED)
HEPARIN UNFRACTIONATED: 0.38 [IU]/mL (ref 0.30–0.70)
HEPARIN UNFRACTIONATED: 0.51 [IU]/mL (ref 0.30–0.70)

## 2017-01-26 LAB — D-DIMER, QUANTITATIVE: D-Dimer, Quant: 0.53 ug/mL-FEU — ABNORMAL HIGH (ref 0.00–0.50)

## 2017-01-26 MED ORDER — HEPARIN SODIUM (PORCINE) 5000 UNIT/ML IJ SOLN: 5000.0000 [IU] | Freq: Three times a day (TID) | INTRAMUSCULAR | Status: DC

## 2017-01-26 MED ORDER — ENOXAPARIN SODIUM 40 MG/0.4ML ~~LOC~~ SOLN
40.0000 mg | SUBCUTANEOUS | Status: DC
  Administered 2017-01-26 – 2017-01-27 (×2): 40 mg via SUBCUTANEOUS
  Filled 2017-01-26 (×3): qty 0.4

## 2017-01-26 NOTE — Progress Notes (Signed)
PROGRESS NOTE  Sue Martin ZOX:[id number] DOB: 05/20/1966 DOA: 01/22/2017 PCP: Terressa KoyanagiKim, [name], DO   Brief Summary:   Hx of CAD, stent placement, sCHF with EF 39%,  liver cirrhosis, thrombocytopenia, insulin dependent dm2, bilateral BKA, retinal detachment,  who presents with chest tightness, sob, edema, cxr consistent with chf, negative troponin-->IV lasix.  F/u v/Q scan/doppler of bilateral stump  HPI/Recap of past 24 hours: Pt has no new complaints. States that anytime she lays flat she gets short of breath. This has occurred since she was a child.   Assessment/Plan: Principal Problem:   Acute on chronic systolic CHF (congestive heart failure) (HCC) Active Problems:   Type 2 diabetes mellitus with diabetic nephropathy (HCC)   Cirrhosis of liver without ascites (HCC)   S/P bilateral BKA (below knee amputation) (HCC)   CAD (coronary artery disease)   CKD (chronic kidney disease) stage 3, GFR 30-59 ml/min   Hyperlipidemia   Chest pain   Acute on chronic respiratory failure with hypoxia (HCC)   Thrombocytopenia (HCC)   Acute respiratory failure with hypoxia due to acute on chronic combined systolic/diastolic CHF (baseline not o2 dependent): -Presumed to be secondary to CHF exacerbation. Improving on lasix. Pt has removed supplemental oxygen and oxygen saturation dropped to 66 percent. Was placed back on [place]. She states that her condition is improving however. - d dimer obtained and was just mildly above normal limits. Given that patient is improving and d-dimer is only 0.03 above normal in context of patient with CK D stage III my index of suspicion for PE is low. Patient had negative ultrasound of lower extremities, no hemoptysis, and sob is improving with sob. Discussed with hematologist who recommended I go with my clinical judgement. As such will d/c heparin as I have a low index of suspicion.    Chest pain and hx of CAD: s/p stent. Patient's chest pain has resolved currently.  Most likely due to demand ischemia secondary to CHF exacerbation. - ekg no acute st/t changes - When necessary morphine and nitroglycerin for pain - On aspirin/ Lipitor/betablocker - troponin negative 3 , 2-D echo "Diffuse hypokinesis. There is akinesis of the apical myocardium" - resolved no chest pain reported to me.  Hypokalemia:  -  repleted -  Mg 2.  Nausea, vomiting (presenting complaints):   -resolved   Cirrhosis of liver without ascites (HCC):  - Ammonia level  Elevated at 53 on admission though no confusion, tbili elevated at 3.8, trending down from last check.  - repeat LFT reporting normal values with ALT below normal values.  Thrombocytopenia (HCC): This is a chronic issue. Most likely due to liver cirrhosis. No bleeding tendency. Platelet 79-86 since admission, was 115 in 08/2016 - heparin gtt discontinued. platelett count stable at 84  Insulin dependent DM-II: Last A1c 8.7, poorly controled.  - continue current lantus dose. -SSI  CKD-III: Stable. Baseline creatinine 1.1-1.3. Her creatinine is 1.33, BUN 2017. -Serum creatinine a little higher above baseline and on last check 1.8  HLD: -lipitor, stable no chest pain reported.  Bilateral amputee/ wheelchair dependent and visual impairment at baseline from retinal detachment.  Body mass index is 53.36 kg/m.   DVT ppx: patient is double amputee, not able to use scd, Heparin sq Code Status: Full code Family Communication: discussed with pt directly patient alert and awake Disposition Plan:  Anticipate discharge back to previous home environment, likely will need home health Consults called:  none   Procedures:  none  Antibiotics:  none  Objective: BP 97/84 (BP Location: Left Arm)   Pulse 72   Temp 97.6 F (36.4 C) (Oral)   Resp 18   Ht 4\' 7"  (1.397 m)   Wt 104.1 kg (229 lb 9.6 oz) Comment: bed scale  SpO2 97%   BMI 53.36 kg/m   Intake/Output Summary (Last 24 hours) at 01/26/17  1432 Last data filed at 01/26/17 1148  Gross per 24 hour  Intake              618 ml  Output             1250 ml  Net             -632 ml   Filed Weights   01/24/17 0355 01/25/17 0514 01/26/17 0429  Weight: 112.2 kg (247 lb 4.8 oz) 106.1 kg (234 lb) 104.1 kg (229 lb 9.6 oz)    Exam:   General:  Pt in nad, alert and awake  Cardiovascular: RRR, no gallops or rubs  Respiratory: overall diminished, + scatters crackles, no wheezing, no rhonchi  Abdomen: Soft/ND/NT, positive BS, + abdominal wall edema,   Musculoskeletal: stump Edema  Neuro: aaox3  Data Reviewed: Basic Metabolic Panel:  Recent Labs Lab 01/22/17 1723 01/23/17 0603 01/24/17 0329 01/25/17 0243 01/26/17 0039  NA 141 141 139 138 139  K 3.0* 2.9* 3.8 3.8 4.1  CL 96* 94* 95* 94* 94*  CO2 37* 37* 34* 35* 34*  GLUCOSE 88 106* 191* 199* 158*  BUN 27* 27* 30* 35* 38*  CREATININE 1.33* 1.39* 1.65* 1.85* 1.87*  CALCIUM 9.4 9.4 8.9 9.2 9.1  MG  --  2.0 1.9  --   --    Liver Function Tests:  Recent Labs Lab 01/22/17 2126 01/24/17 0329 01/25/17 0243  AST 22 19 20   ALT 10* 11* 10*  ALKPHOS 108 101 115  BILITOT 4.6* 4.0* 3.8*  PROT 6.8 6.0* 6.7  ALBUMIN 3.3* 3.1* 3.2*    Recent Labs Lab 01/22/17 2126  LIPASE 31    Recent Labs Lab 01/22/17 2126  AMMONIA 53*   CBC:  Recent Labs Lab 01/22/17 1723 01/24/17 0329 01/25/17 0243 01/26/17 0039 01/26/17 0323  WBC 6.4 6.0 6.3 5.4 5.8  HGB 16.5* 14.4 14.9 14.6 14.4  HCT 49.6* 45.8 47.0* 46.0 45.4  MCV 93.4 95.4 95.9 95.4 95.6  PLT 79* 86* 95* 85* 84*   Cardiac Enzymes:    Recent Labs Lab 01/23/17 1531 01/23/17 1937  TROPONINI 0.04* <0.03   BNP (last 3 results)  Recent Labs  01/22/17 1723  BNP 474.8*    ProBNP (last 3 results) No results for input(s): PROBNP in the last 8760 hours.  CBG:  Recent Labs Lab 01/25/17 1155 01/25/17 1713 01/25/17 2102 01/26/17 0730 01/26/17 1144  GLUCAP 249* 228* 198* 119* 153*    No results  found for this or any previous visit (from the past 240 hour(s)).   Studies: No results found.  Scheduled Meds: . aspirin  325 mg Oral QHS  . atorvastatin  20 mg Oral Daily  . furosemide  80 mg Intravenous Q12H  . hydrALAZINE  10 mg Oral BID  . insulin aspart  0-9 Units Subcutaneous TID WC  . insulin glargine  20 Units Subcutaneous Q breakfast  . isosorbide mononitrate  15 mg Oral Daily  . metoprolol tartrate  12.5 mg Oral BID  . potassium chloride  40 mEq Oral Daily  . sodium chloride flush  3 mL Intravenous Q12H  . spironolactone  25 mg Oral QHS    Continuous Infusions: . sodium chloride    . heparin 1,200 Units/hr (01/26/17 0534)     Time spent: 35mins  Penny PiaVEGA, Sue Sliwa MD, PhD  [facility] Pager [phone]. If 7PM-7AM, please contact night-coverage at [email or web address], password [email or web address] 01/26/2017, 2:32 PM  LOS: 4 days

## 2017-01-26 NOTE — Progress Notes (Signed)
ANTICOAGULATION CONSULT NOTE - Follow Up Consult  Pharmacy Consult for heparin Indication: r/o possible PE  Labs:  Recent Labs  01/23/17 1531 01/23/17 1937  01/24/17 0329 01/25/17 0243 01/25/17 1922 01/26/17 0039  HGB  --   --   < > 14.4 14.9  --  14.6  HCT  --   --   --  45.8 47.0*  --  46.0  PLT  --   --   --  86* 95*  --  85*  HEPARINUNFRC  --   --   --   --   --  0.47 0.51  CREATININE  --   --   --  1.65* 1.85*  --  1.87*  TROPONINI 0.04* <0.03  --   --   --   --   --   < > = values in this interval not displayed.   Assessment/Plan:  51yo female remains therapeutic on heparin . Will continue gtt at current rate and monitor daily level.   Vernard GamblesVeronda Lannis Lichtenwalner, PharmD, BCPS  01/26/2017,1:40 AM

## 2017-01-26 NOTE — Progress Notes (Signed)
Inpatient Diabetes Program Recommendations  AACE/ADA: New Consensus Statement on Inpatient Glycemic Control (2015)  Target Ranges:  Prepandial:   less than 140 mg/dL      Peak postprandial:   less than 180 mg/dL (1-2 hours)      Critically ill patients:  140 - 180 mg/dL  Results for Sue Martin, Sue Martin (MRN [id number]) as of 01/26/2017 10:10  Ref. Range 01/25/2017 07:38 01/25/2017 11:55 01/25/2017 17:13 01/25/2017 21:02 01/26/2017 07:30  Glucose-Capillary Latest Ref Range: 65 - 99 mg/dL 409174 (H) 811249 (H) 914228 (H) 198 (H) 119 (H)    Review of Glycemic Control  Diabetes history: DM2 Outpatient Diabetes medications: Tresiba 30 untis QAM, Novolog 35 units TID with meals Current orders for Inpatient glycemic control: Lantus 20 units QAM, Novolog 0-9 units TID with meals  Inpatient Diabetes Program Recommendations: Correction (SSI): Please consider ordering Novolog 0-5 units QHS for bedtime correction scale. Insulin - Meal Coverage: Post prandial glucose consistently elevated. Please consider ordering Novolog 3 units TID with meals for meal coverage if patient eats at least 50% of meals.  Thanks, Orlando PennerMarie Thuy Atilano, RN, MSN, CDE Diabetes Coordinator Inpatient Diabetes Program [phone] (Team Pager from 8am to 5pm)

## 2017-01-26 NOTE — Progress Notes (Addendum)
ANTICOAGULATION CONSULT NOTE - Follow Up Consult  Pharmacy Consult for Heparin Indication: Rule out PE  Allergies  Allergen Reactions  . Broccoli [Brassica Oleracea Italica] Anaphylaxis  . Influenza Vaccines Anaphylaxis    Per patient  . Pneumococcal Vaccines Anaphylaxis    Per patient  . Trulicity [Dulaglutide] Hives and Nausea And Vomiting  . Amoxicillin Nausea And Vomiting  . Doxycycline Hives and Nausea And Vomiting  . Iron Other (See Comments)    GI intolerance  . Latex Other (See Comments)    blisters  . Lisinopril Nausea Only  . Penicillins Other (See Comments)    GI intolerance, UTI, can tolerate in IV Tolerates cephalosporins  . Tape Other (See Comments)    Tears skin.  Please use "paper" tape  . Codeine Nausea And Vomiting and Rash  . Sulfa Antibiotics Nausea And Vomiting and Rash  . Sulfur Nausea And Vomiting and Rash    Patient Measurements: Height: 4\' 7"  (139.7 cm) Weight: 229 lb 9.6 oz (104.1 kg) (bed scale) IBW/kg (Calculated) : 34 Heparin Dosing Weight: used ~75kg  Vital Signs: Temp: 97.4 F (36.3 C) (08/02 0846) Temp Source: Oral (08/02 0846) BP: 117/58 (08/02 0846) Pulse Rate: 68 (08/02 0846)  Labs:  Recent Labs  01/23/17 1531 01/23/17 1937  01/24/17 0329 01/25/17 0243 01/25/17 1922 01/26/17 0039 01/26/17 0323  HGB  --   --   < > 14.4 14.9  --  14.6 14.4  HCT  --   --   < > 45.8 47.0*  --  46.0 45.4  PLT  --   --   < > 86* 95*  --  85* 84*  HEPARINUNFRC  --   --   --   --   --  0.47 0.51 0.38  CREATININE  --   --   --  1.65* 1.85*  --  1.87*  --   TROPONINI 0.04* <0.03  --   --   --   --   --   --   < > = values in this interval not displayed.  Estimated Creatinine Clearance: 35.2 mL/min (A) (by C-G formula based on SCr of 1.87 mg/dL (H)).   Medications:  Heparin @ 1200 units/hr  Assessment: 51yof started on heparin on 8/1 for possible PE. VQ scan aborted because patient turned purple. Heparin level is therapeutic at 0.38 on  heparin drip 1200 units/hr. No bleeding reported. 8/2 D-dimer= 0.53, slightly elevated. H/o  thrombocytopenia, chronic-MD noted likely d/t liver cirrhosis, no bleeding tendency. Platelet [id number]>84k since admission.  PLTC was 115 in 08/2016 < 134 (01/2016)<160 (01/2015).  Last received sq heparin ,only 1 dose in Aug. 2016 per EPIC medication review.   Goal of Therapy:  Heparin level 0.3-0.7 units/ml Monitor platelets by anticoagulation protocol: Yes   Plan:  Continue heparin at 1200 units/hr Daily HL,CBC F/u if able to do VQ scan or if needs oral anticoagulation.   Noah Delaineuth Donn Wilmot, RPh Clinical Pharmacist Pager: [phone] [phone]-[phone] [phone] Main Pharmacy [phone] 01/26/2017,11:03 AM    ADDENDUM:   Dr. Cena BentonVega noted patient is improving and d-dimer 5.3  is only 0.03 above normal in context of patient with CKD stage III my index of suspicion for PE is low. Patient had negative ultrasound of lower extremities, no hemoptysis, and sob is improving with sob. Dr. Cena BentonVega has discontinued IV heparin due to a low index of suspicion for PE and asked pharmacy to dose heparin SQ for VTE prophylaxis. Pt has refused sq heparin/lovenox in the  past and Dr. Cena BentonVega is aware.  RN notes pt has had some small nose bleeds thought to be due to O2 [employer] irritation and Dr. Cena BentonVega is aware. Dr.Vega agrees okay to give lovenox sq q24h  For VTE prophylaxis instead of sq heparin (q8h).   Plan:  dc IV heparin drip and start lovenox 40mg  sq q24h  (crcl 6935ml/min but low pltc).  Pharmacy will sign off.   Thank you for allowing pharmacy to be part of this patients care team. Noah Delaineuth Sylvester Minton, RPh Clinical Pharmacist Pager: [phone] 01/26/2017 3:37 PM

## 2017-01-26 NOTE — Care Management Important Message (Signed)
Important Message  Patient Details  Name: Sue Martin MRN: [id number] Date of Birth: [birth year]   Medicare Important Message Given:  Yes    Kayli Beal Abena 01/26/2017, 10:06 AM

## 2017-01-26 NOTE — Progress Notes (Signed)
Page to Dr. Cena BentonVega  Pt O2 sat 66% on RA. Placed pt on [place] and then NRB mask. O2 back up to 98% now. She cannot tolerate RA at this time.

## 2017-01-26 NOTE — Progress Notes (Signed)
Called to pt room for lightheadedness and dizziness. Pt face slightly blue upon assessment. O2 sat 66% on RA. Placed pt on 4L [place] and pt went up to 87%. Pt on NRB mask at 6L with O2 sat at 6L. Paged MD to make aware.

## 2017-01-27 ENCOUNTER — Inpatient Hospital Stay (HOSPITAL_COMMUNITY): Payer: Medicare HMO

## 2017-01-27 ENCOUNTER — Telehealth: Payer: Self-pay | Admitting: Endocrinology

## 2017-01-27 ENCOUNTER — Encounter (HOSPITAL_COMMUNITY): Payer: Self-pay | Admitting: Physician Assistant

## 2017-01-27 DIAGNOSIS — N184 Chronic kidney disease, stage 4 (severe): Secondary | ICD-10-CM

## 2017-01-27 DIAGNOSIS — I472 Ventricular tachycardia: Secondary | ICD-10-CM

## 2017-01-27 DIAGNOSIS — N171 Acute kidney failure with acute cortical necrosis: Secondary | ICD-10-CM

## 2017-01-27 DIAGNOSIS — J9601 Acute respiratory failure with hypoxia: Secondary | ICD-10-CM

## 2017-01-27 LAB — GLUCOSE, CAPILLARY
GLUCOSE-CAPILLARY: 175 mg/dL — AB (ref 65–99)
Glucose-Capillary: 126 mg/dL — ABNORMAL HIGH (ref 65–99)
Glucose-Capillary: 185 mg/dL — ABNORMAL HIGH (ref 65–99)
Glucose-Capillary: 195 mg/dL — ABNORMAL HIGH (ref 65–99)

## 2017-01-27 LAB — BASIC METABOLIC PANEL
ANION GAP: 13 (ref 5–15)
BUN: 49 mg/dL — ABNORMAL HIGH (ref 6–20)
CO2: 34 mmol/L — ABNORMAL HIGH (ref 22–32)
Calcium: 9.5 mg/dL (ref 8.9–10.3)
Chloride: 93 mmol/L — ABNORMAL LOW (ref 101–111)
Creatinine, Ser: 1.99 mg/dL — ABNORMAL HIGH (ref 0.44–1.00)
GFR calc Af Amer: 33 mL/min — ABNORMAL LOW (ref >60)
GFR, EST NON AFRICAN AMERICAN: 28 mL/min — AB (ref >60)
GLUCOSE: 136 mg/dL — AB (ref 65–99)
POTASSIUM: 4.4 mmol/L (ref 3.5–5.1)
Sodium: 140 mmol/L (ref 135–145)

## 2017-01-27 LAB — CBC
HEMATOCRIT: 45.1 % (ref 36.0–46.0)
HEMOGLOBIN: 14.5 g/dL (ref 12.0–15.0)
MCH: 31 pg (ref 26.0–34.0)
MCHC: 32.2 g/dL (ref 30.0–36.0)
MCV: 96.4 fL (ref 78.0–100.0)
Platelets: 91 10*3/uL — ABNORMAL LOW (ref 150–400)
RBC: 4.68 MIL/uL (ref 3.87–5.11)
RDW: 16.3 % — ABNORMAL HIGH (ref 11.5–15.5)
WBC: 4.6 10*3/uL (ref 4.0–10.5)

## 2017-01-27 NOTE — Progress Notes (Signed)
Received assignment and report from Shanda BumpsJessica, RN on 3E05, Ms Rawling.  Patient alert and oriented x 3.  Introduced self.  Call bell within reach.

## 2017-01-27 NOTE — Plan of Care (Signed)
Problem: Skin Integrity: Goal: Risk for impaired skin integrity will decrease Outcome: Progressing Pt able to roll in bed.

## 2017-01-27 NOTE — Telephone Encounter (Signed)
Routing to you °

## 2017-01-27 NOTE — Progress Notes (Signed)
Orders for NPO after midnight for Nuc Med test in am and CXR ordered for today.  Patient updated on plan of care and to not eat or drink anything after midnight.  Stated understanding

## 2017-01-27 NOTE — Progress Notes (Signed)
PROGRESS NOTE  Sue Martin ZOX:[id number] DOB: 1966-02-09 DOA: 01/22/2017 PCP: Terressa Koyanagi, DO   Brief Summary:   Hx of CAD, stent placement, sCHF with EF 39%,  liver cirrhosis, thrombocytopenia, insulin dependent dm2, bilateral BKA, retinal detachment,  who presents with chest tightness, sob, edema, cxr consistent with chf, negative troponin-->IV lasix.  F/u v/Q scan/doppler of bilateral stump  HPI/Recap of past 24 hours: Pt would like to consider going home on supplemental oxygen.  Nursing called and informed me patient had a non sustained VT of 10 beats  Assessment/Plan: Principal Problem:   Acute on chronic systolic CHF (congestive heart failure) (HCC) Active Problems:   Type 2 diabetes mellitus with diabetic nephropathy (HCC)   Cirrhosis of liver without ascites (HCC)   S/P bilateral BKA (below knee amputation) (HCC)   CAD (coronary artery disease)   CKD (chronic kidney disease) stage 3, GFR 30-59 ml/min   Hyperlipidemia   Chest pain   Acute on chronic respiratory failure with hypoxia (HCC)   Thrombocytopenia (HCC)   Acute respiratory failure with hypoxia due to acute on chronic combined systolic/diastolic CHF (baseline not o2 dependent): - low index of suspicion for PE as such heparin d/c'd - Cardiology consulted for assistance with diureses and optimization of medications.  - Continue supplemental oxygen.  - lasix per cardiology. Pt has chronic systolic CHF with severe LV dysfunction and LVEF 30-35% and restrictive pattern of diastolic dysfunction. Patient has multiple medical issues acute on chronic kidney dysfunction, acute on chronic combined systolic and diastolic heart failure, acute on chronic respiratory failure and I agree that patient's prognosis overall is poor.  Nonsustained V. Tach - Cardiology assisting. Will place orders for magnesium and BMP to assess potassium levels next a.m.  Chest pain and hx of CAD: s/p stent. Patient's chest pain has resolved  currently. Most likely due to demand ischemia secondary to CHF exacerbation. - ekg no acute st/t changes - When necessary morphine and nitroglycerin for pain - On aspirin/Lipitor/betablocker - troponin negative 3 , 2-D echo "Diffuse hypokinesis. There is akinesis of the apical myocardium" - resolved no chest pain reported to me.  Hypokalemia:  -  K on last check 4.4 - reassess Mg levels next am.  Nausea, vomiting (presenting complaints):   - resolved  Cirrhosis of liver without ascites (HCC):  - Ammonia level  Elevated at 53 on admission though no confusion, tbili elevated at 3.8, trending down from last check.  - repeat LFT reporting normal values with ALT below normal values.  Thrombocytopenia (HCC): This is a chronic issue. Most likely due to liver cirrhosis. No bleeding tendency. Platelet 79-86 since admission, was 115 in 08/2016 - heparin gtt discontinued. platelett count stable at 84  Insulin dependent DM-II: Last A1c 8.7, poorly controled.  - continue current lantus dose. -SSI  CKD-III: Stable. Baseline creatinine 1.1-1.3. Her creatinine is 1.33, BUN 2017. -Serum creatinine a little higher above baseline and on last check 1.9  HLD: -lipitor, stable no chest pain reported.  Bilateral amputee/ wheelchair dependent and visual impairment at baseline from retinal detachment.  Body mass index is 57.57 kg/m.   DVT ppx: patient is double amputee, not able to use scd, Heparin sq Code Status: Full code Family Communication: discussed with pt directly patient alert and awake Disposition Plan:  Anticipate discharge back to previous home environment, likely will need home health Consults called:  none   Procedures:  none  Antibiotics:  none   Objective: BP (!) 100/59 (BP Location: Left  Arm)   Pulse 74   Temp (!) 97.5 F (36.4 C) (Oral)   Resp 18   Ht 4\' 7"  (1.397 m)   Wt 112.4 kg (247 lb 11.2 oz) Comment: bed  SpO2 92%   BMI 57.57 kg/m   Intake/Output  Summary (Last 24 hours) at 01/27/17 1605 Last data filed at 01/27/17 1431  Gross per 24 hour  Intake              880 ml  Output             1600 ml  Net             -720 ml   Filed Weights   01/25/17 0514 01/26/17 0429 01/27/17 0429  Weight: 106.1 kg (234 lb) 104.1 kg (229 lb 9.6 oz) 112.4 kg (247 lb 11.2 oz)    Exam:Exam unchanged when compared 01/26/2017   General:  Pt in nad, alert and awake  Cardiovascular: RRR, no gallops or rubs  Respiratory: overall diminished, + scatters crackles, no wheezing, no rhonchi  Abdomen: Soft/ND/NT, positive BS, + abdominal wall edema,   Musculoskeletal: stump Edema  Neuro: aaox3  Data Reviewed: Basic Metabolic Panel:  Recent Labs Lab 01/23/17 0603 01/24/17 0329 01/25/17 0243 01/26/17 0039 01/27/17 0412  NA 141 139 138 139 140  K 2.9* 3.8 3.8 4.1 4.4  CL 94* 95* 94* 94* 93*  CO2 37* 34* 35* 34* 34*  GLUCOSE 106* 191* 199* 158* 136*  BUN 27* 30* 35* 38* 49*  CREATININE 1.39* 1.65* 1.85* 1.87* 1.99*  CALCIUM 9.4 8.9 9.2 9.1 9.5  MG 2.0 1.9  --   --   --    Liver Function Tests:  Recent Labs Lab 01/22/17 2126 01/24/17 0329 01/25/17 0243  AST 22 19 20   ALT 10* 11* 10*  ALKPHOS 108 101 115  BILITOT 4.6* 4.0* 3.8*  PROT 6.8 6.0* 6.7  ALBUMIN 3.3* 3.1* 3.2*    Recent Labs Lab 01/22/17 2126  LIPASE 31    Recent Labs Lab 01/22/17 2126  AMMONIA 53*   CBC:  Recent Labs Lab 01/24/17 0329 01/25/17 0243 01/26/17 0039 01/26/17 0323 01/27/17 0412  WBC 6.0 6.3 5.4 5.8 4.6  HGB 14.4 14.9 14.6 14.4 14.5  HCT 45.8 47.0* 46.0 45.4 45.1  MCV 95.4 95.9 95.4 95.6 96.4  PLT 86* 95* 85* 84* 91*   Cardiac Enzymes:    Recent Labs Lab 01/23/17 1531 01/23/17 1937  TROPONINI 0.04* <0.03   BNP (last 3 results)  Recent Labs  01/22/17 1723  BNP 474.8*    ProBNP (last 3 results) No results for input(s): PROBNP in the last 8760 hours.  CBG:  Recent Labs Lab 01/26/17 1144 01/26/17 1638 01/26/17 2122  01/27/17 0736 01/27/17 1117  GLUCAP 153* 204* 202* 126* 175*    No results found for this or any previous visit (from the past 240 hour(s)).   Studies: No results found.  Scheduled Meds: . aspirin  325 mg Oral QHS  . atorvastatin  20 mg Oral Daily  . enoxaparin (LOVENOX) injection  40 mg Subcutaneous Q24H  . furosemide  80 mg Intravenous Q12H  . hydrALAZINE  10 mg Oral BID  . insulin aspart  0-9 Units Subcutaneous TID WC  . insulin glargine  20 Units Subcutaneous Q breakfast  . isosorbide mononitrate  15 mg Oral Daily  . metoprolol tartrate  12.5 mg Oral BID  . potassium chloride  40 mEq Oral Daily  . sodium chloride flush  3 mL Intravenous Q12H  . spironolactone  25 mg Oral QHS    Continuous Infusions: . sodium chloride      Time spent: 35mins  Penny PiaVEGA, Aleka Twitty MD, PhD  [facility] Pager [phone]. If 7PM-7AM, please contact night-coverage at [email or web address], password [email or web address] 01/27/2017, 4:05 PM  LOS: 5 days

## 2017-01-27 NOTE — Plan of Care (Signed)
Problem: Activity: Goal: Capacity to carry out activities will improve Outcome: Progressing Pt verbalizes "feeling better" when exerting herself to turn and move in bed but still becomes DOE and require O2 supplement. Will continue to try to wean pt down from O2 as tolerated.   Problem: Pain Managment: Goal: General experience of comfort will improve Outcome: Progressing Pt has denied any pain and has not required any PRN medications.   Problem: Skin Integrity: Goal: Risk for impaired skin integrity will decrease Outcome: Progressing Will continue to educate pt on importance of self-turning at least q2 hours.

## 2017-01-27 NOTE — Progress Notes (Signed)
Pt had 10 beats of VT. Pt asymptomatic, BP 107/78 HR 71. Paged MD.

## 2017-01-27 NOTE — Care Management Note (Signed)
Case Management Note  Patient Details  Name: Sue Martin MRN: [id number] Date of Birth: 1965/07/24  Subjective/Objective:   CHF                Action/Plan: Patient lives at home with her family; PCP: Terressa KoyanagiKim, [name], DO; has private insurance with [facility] [employer] [facility] [employer]; Bilateral amputee; DME- wheelchair at home, shower chair; she is requesting a shower extender, patient will have to go to a medical supply store to purchase an extender; CM following for DCP  Expected Discharge Date:     Possibly 01/30/2017             Expected Discharge Plan:  Home/Self Care  [facility]   [facility]  CM Consult  Status of Service:  In process, will continue to follow  Reola MosherChandler, Lynkin Saini L, RN,MHA,BSN [phone] 01/27/2017, 10:41 AM

## 2017-01-27 NOTE — Plan of Care (Signed)
Problem: Education: Goal: Ability to demonstrate managment of disease process will improve Outcome: Progressing Pt understands fluid restriction and purpose of lasix.

## 2017-01-27 NOTE — Consult Note (Signed)
Cardiology Consultation:   Patient ID: Sue Martin; [id number]; 1966-03-16   Admit date: 01/22/2017 Date of Consult: 01/27/2017  Primary Care Provider: Terressa KoyanagiKim, [name], DO Primary Cardiologist: New, 10/19/2016 Laveda AbbeUsman A Khawaja, MD at   [facility]  [address]  [place], [place] [id number]  [phone]      Primary Electrophysiologist: n/a Nephrologist: Dr Marisue HumbleSanford     Patient Profile:   Sue Martin is a 51 y.o. female with a hx of S-CHF w/ EF 35%, CAD and HTN, bilat BKA, DM,, HLD, CKD III, cirrhosis, PAD, who is being seen today for the evaluation of CHF at the request of Dr Cena BentonVega.  History of Present Illness:   Sue Martin has been living in [place] x 6 years, but continues to see Dr Wynonia HazardKhawaja. She was admitted 07/29 for worsening SOB, acute resp failure 2nd Acute on chronic systolic CHF. CP was felt 2nd demand ischemia from CHF. Pt weight 249 lbs on admission, has varied between that and 229 lbs, 247 lbs today.   She has been on Lasix 80 mg IV BID since admission. Her renal function and electrolytes have been followed closely. Echo w/ EF 30-35% and grade 3 dd.   Yesterday, pt sats dropped to 66% on RA, required NRB to keep sats up. Today, pt had 10 bt run VT.   Pt says she is breathing better today than yesterday. Says pta, she was putting on fluid. Her cardiologist would increase the torsemide and the nephrologist would decrease it. The whole time, she was getting more and more SOB. She describes increasing DOE, orthopnea and PND. She was not on O2 at home. Not able to weigh at home.   She is breathing better now. She still requires 3 lpm O2, says they are trying to send her home on this.   Her son and BFF cook fresh food, she rarely gets any processed food and feels confident there is no salt in the food she eats. She has an awareness of the amount of liquids she drinks and says she does not overdrink. She is not able to be very active, gets  around in a motorized wheelchair.   She is upset that her CBGs are so high here, at home she tries to keep them below 120. Here, she has had multiple CBGs > 200. Initial CBG was 68, but pt says she had not been eating because she did not feel well.    Past Medical History:  Diagnosis Date  . CAD (coronary artery disease) 12/12/2014   -s/p 2V PCI of the LAD/RCA with taxus stents -cardiologist is Dr. Carmon SailsKwawaja, Celedonio SavageUsman   . CHF (congestive heart failure) (HCC)    sees Dr. Wynonia HazardKhawaja  . Chronic kidney disease   . Depression   . Diabetes mellitus with renal complications (HCC)    PVD and retinopathy, S/p bilat ambutations  . Glaucoma   . Liver cirrhosis (HCC)   . Lyme disease   . Retinal detachment    sees Dr. Johna SheriffPeter Rogaski per her report  . 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 ESOPHAGOGASTRODUODENOSCOPY (EGD) WITH PROPOFOL N/A 07/07/2016   Procedure: ESOPHAGOGASTRODUODENOSCOPY (EGD) WITH PROPOFOL;  Surgeon: Rachael Feeaniel P Jacobs, MD;  Location: [facility];  Service: Endoscopy;  Laterality: N/A;  . LASIK    . LEG AMPUTATION BELOW KNEE  09/09/2011, 09/11/2011  . TUBAL LIGATION       Inpatient Medications:  Scheduled Meds: . aspirin  325 mg Oral QHS  . atorvastatin  20 mg Oral Daily  . enoxaparin (LOVENOX) injection  40 mg Subcutaneous Q24H  . furosemide  80 mg Intravenous Q12H  . hydrALAZINE  10 mg Oral BID  . insulin aspart  0-9 Units Subcutaneous TID WC  . insulin glargine  20 Units Subcutaneous Q breakfast  . isosorbide mononitrate  15 mg Oral Daily  . metoprolol tartrate  12.5 mg Oral BID  . potassium chloride  40 mEq Oral Daily  . sodium chloride flush  3 mL Intravenous Q12H  . spironolactone  25 mg Oral QHS   Continuous Infusions: . sodium chloride     PRN Meds: sodium chloride, acetaminophen, benzonatate, cyclobenzaprine, hydrALAZINE, morphine injection, nitroGLYCERIN, ondansetron, oxymetazoline, sodium  chloride flush, zolpidem Medication Sig  aspirin 325 MG tablet Take 325 mg by mouth at bedtime.   atorvastatin (LIPITOR) 20 MG tablet Take 1 tablet (20 mg total) by mouth daily. Patient taking differently: Take 20 mg by mouth every evening.   cyclobenzaprine (FLEXERIL) 5 MG tablet Take 1 tablet (5 mg total) by mouth 2 (two) times daily as needed for muscle spasms.  ibuprofen (ADVIL,MOTRIN) 200 MG tablet Take 200 mg by mouth every 6 (six) hours as needed for mild pain.  insulin aspart (NOVOLOG) 100 UNIT/ML FlexPen Inject 35 Units into the skin 3 (three) times daily with meals.  insulin degludec (TRESIBA FLEXTOUCH) 100 UNIT/ML SOPN FlexTouch Pen Inject 0.4 mLs (40 Units total) into the skin daily with breakfast. Patient taking differently: Inject 30 Units into the skin daily with breakfast.   isosorbide mononitrate (IMDUR) 30 MG 24 hr tablet Take 30 mg by mouth daily.   lisinopril (PRINIVIL,ZESTRIL) 10 MG tablet Take 10 mg by mouth at bedtime.   metolazone (ZAROXOLYN) 5 MG tablet TAKE ONE TABLET BY MOUTH ONCE DAILY (30  MINUTES  BEFORE  DEMADEX  DOSE) Patient taking differently: Take 5mg s at night  metoprolol succinate (TOPROL-XL) 25 MG 24 hr tablet Take 25 mg by mouth at bedtime.  potassium chloride 20 MEQ/15ML (10%) SOLN Take 30 mEq by mouth 2 (two) times daily.   spironolactone (ALDACTONE) 25 MG tablet Take 1 tablet (25 mg total) by mouth daily. Patient taking differently: Take 25 mg by mouth at bedtime.   torsemide (DEMADEX) 20 MG tablet TAKE ONE TABLET BY MOUTH TWICE DAILY  hydrALAZINE (APRESOLINE) 10 MG tablet Take 1 tablet (10 mg total) by mouth 3 (three) times daily.    Allergies:    Allergies  Allergen Reactions  . Broccoli [Brassica Oleracea Italica] Anaphylaxis  . Influenza Vaccines Anaphylaxis    Per patient  . Pneumococcal Vaccines Anaphylaxis    Per patient  . Trulicity [Dulaglutide] Hives and Nausea And Vomiting  . Amoxicillin Nausea And Vomiting  . Doxycycline Hives and  Nausea And Vomiting  . Iron Other (See Comments)    GI intolerance  . Latex Other (See Comments)    blisters  . Lisinopril Nausea Only  . Penicillins Other (See Comments)    GI intolerance, UTI, can tolerate in IV Tolerates cephalosporins  . Tape Other (See Comments)    Tears skin.  Please use "paper" tape  . Codeine Nausea And Vomiting and Rash  . Sulfa Antibiotics Nausea And Vomiting and Rash  . Sulfur Nausea And Vomiting and Rash    Social History:   Social History   Social History  . Marital status: Married    Spouse name: N/A  . Number of children:  N/A  . Years of education: N/A   Occupational History  . Disabled    Social History Main Topics  . Smoking status: Never Smoker  . Smokeless tobacco: Never Used  . Alcohol use No  . Drug use: No  . Sexual activity: Not on file   Other Topics Concern  . Not on file   Social History Narrative   Married   Husband is a Naval architect, he is home 3 days/month   Son and BFF live there and help her.       Family History:   The patient's family history includes Arthritis in her father, maternal grandmother, and mother; CVA in her father; Diabetes in her maternal grandmother and mother; Heart disease in her mother; Hypertension in her father; Mental illness in her mother; Sudden death in her paternal grandfather. Pt indicated that the status of her mother is unknown. She indicated that the status of her father is unknown. She indicated that the status of her maternal grandmother is unknown. She indicated that the status of her paternal grandfather is unknown.   ROS:  Please see the history of present illness.  All other ROS reviewed and negative.      Physical Exam/Data:   Vitals:   01/26/17 2137 01/27/17 0142 01/27/17 0429 01/27/17 1151  BP: 110/66  120/79 (!) 100/59  Pulse: 73  65 74  Resp: 20  18 18   Temp: 97.7 F (36.5 C)  98.2 F (36.8 C) (!) 97.5 F (36.4 C)  TempSrc: Oral  Oral Oral  SpO2: 94% 97% 93% 92%    Weight:   247 lb 11.2 oz (112.4 kg)   Height:        Intake/Output Summary (Last 24 hours) at 01/27/17 1443 Last data filed at 01/27/17 1431  Gross per 24 hour  Intake              880 ml  Output             1600 ml  Net             -720 ml   Filed Weights   01/25/17 0514 01/26/17 0429 01/27/17 0429  Weight: 234 lb (106.1 kg) 229 lb 9.6 oz (104.1 kg) 247 lb 11.2 oz (112.4 kg)   Body mass index is 57.57 kg/m.  General:  Well nourished, well developed, in no acute distress HEENT: normal Lymph: no adenopathy Neck: no JVD seen, difficult to assess 2nd body habitus  Endocrine:  No thryomegaly Vascular: No carotid bruits; radial pulses 2+ bilaterally Cardiac:  normal S1, S2; RRR; no murmur  Lungs: decreased BS bases bilaterally, no wheezing, rhonchi or rales  Abd: soft, nontender, no hepatomegaly, abdomen is enlarged and firm Ext: no edema Musculoskeletal:  No deformities, BUE and BLE strength normal and equal Skin: warm and dry  Neuro:  CNs 2-12 intact, no focal abnormalities noted Psych:  Normal affect   EKG:  The EKG was personally reviewed and demonstrates:  07/30, SR, poor R wave progression Telemetry:  Telemetry was personally reviewed and demonstrates:  SR, 10 bt run VT  Relevant CV Studies: ECHO: 01/23/2017 - Left ventricle: The cavity size was normal. Wall thickness was   normal. Systolic function was moderately to severely reduced. The   estimated ejection fraction was in the range of 30% to 35%.   Diffuse hypokinesis. There is akinesis of the apical myocardium.   Doppler parameters are consistent with a reversible restrictive   pattern, indicative of decreased  left ventricular diastolic   compliance and/or increased left atrial pressure (grade 3   diastolic dysfunction). - Left atrium: The atrium was mildly dilated. - Pulmonic valve: There was moderate regurgitation.  Laboratory Data:  Chemistry  Recent Labs Lab 01/25/17 0243 01/26/17 0039 01/27/17 0412   NA 138 139 140  K 3.8 4.1 4.4  CL 94* 94* 93*  CO2 35* 34* 34*  GLUCOSE 199* 158* 136*  BUN 35* 38* 49*  CREATININE 1.85* 1.87* 1.99*  CALCIUM 9.2 9.1 9.5  GFRNONAA 31* 30* 28*  GFRAA 36* 35* 33*  ANIONGAP 9 11 13      Recent Labs Lab 01/22/17 2126 01/24/17 0329 01/25/17 0243  PROT 6.8 6.0* 6.7  ALBUMIN 3.3* 3.1* 3.2*  AST 22 19 20   ALT 10* 11* 10*  ALKPHOS 108 101 115  BILITOT 4.6* 4.0* 3.8*   Hematology  Recent Labs Lab 01/26/17 0039 01/26/17 0323 01/27/17 0412  WBC 5.4 5.8 4.6  RBC 4.82 4.75 4.68  HGB 14.6 14.4 14.5  HCT 46.0 45.4 45.1  MCV 95.4 95.6 96.4  MCH 30.3 30.3 31.0  MCHC 31.7 31.7 32.2  RDW 15.8* 15.8* 16.3*  PLT 85* 84* 91*   Cardiac Enzymes  Recent Labs Lab 01/23/17 1531 01/23/17 1937  TROPONINI 0.04* <0.03     Recent Labs Lab 01/22/17 1747  TROPIPOC 0.02    BNP  Recent Labs Lab 01/22/17 1723  BNP 474.8*    DDimer   Recent Labs Lab 01/26/17 0325  DDIMER 0.53*    Radiology/Studies:  No results found.  Assessment and Plan:   Principal Problem:   Acute on chronic systolic CHF (congestive heart failure) (HCC) - Pt has been struggling with worsening respiratory status for a while. - her renal function has limited diuresis as outpatient - She has been on IV Lasix since admission 07/30, but I/O are only net negative by 2609 cc. - weights are not reliable, they are bed weights.  - BUN/Cr have increased with diuresis, but more is needed as her O2 sats are still low on room air. - as she is followed by Dr Marisue Humble as an outpatient, consider nephrology consult to help manage diuresis. - May have to look at other reasons for desaturation.  Otherwise, per IM Active Problems:   Type 2 diabetes mellitus with diabetic nephropathy (HCC)   Cirrhosis of liver without ascites (HCC)   S/P bilateral BKA (below knee amputation) (HCC)   CAD (coronary artery disease)   CKD (chronic kidney disease) stage 3, GFR 30-59 ml/min    Hyperlipidemia   Chest pain   Acute on chronic respiratory failure with hypoxia (HCC)   Thrombocytopenia (HCC)  Signed, Theodore Demark, PA-C  01/27/2017 2:43 PM  The patient was seen, examined and discussed with Theodore Demark, PA-C and I agree with the above.   Sue Martin is a 51 y.o. female with a hx of S-CHF w/ EF 30-35%, CAD and HTN, bilat BKA, DM,, HLD, CKD III, cirrhosis, PAD, who is being seen today for the evaluation of CHF at the request of Dr Cena Benton. Sue Martin has been living in [place] x 6 years, but continues to see Dr Wynonia Hazard. She was admitted 07/29 for worsening SOB, acute resp failure 2nd Acute on chronic systolic CHF. CP was felt 2nd demand ischemia from CHF. Pt weight 249 lbs on admission, has varied between that and 229 lbs 247 lbs today. Her weight is now increasing and she is more SOB with hypoxia and O2 desaturation yesterday down  to 66%. She has been on Lasix 80 mg IV BID since admission. On physical exam she appears gray, lays flat, lungs appear clear, JVD are not seen due to obesity, she is B/L BKA. Her blood pressures have been soft and heart rates keeping 60s to 70s, and there was one run of nonsustained VT total of 10 beats.  Physical exam is challenging due to obesity, we will obtain a chest x-ray to see what degree of pulmonary edema she has. I would cut down on IV Lasix to 40 mg by mouth twice a day . Her degree of hypoxia is out of proportion to CHF. We'll obtain VQ scan to evaluate for pulmonary embolism. If negative we will request evaluation for home O2.  The patient has chronic systolic CHF with severe LV dysfunction and LVEF 30-35% and restrictive pattern of diastolic dysfunction. She has been on optimal medical therapy chronically, at home on Imdur, hydralazine, lisinopril, atorvastatin and aspirin. Her LV EF hasn't improved despite that. She currently has multiple medical issues including acute on chronic kidney dysfunction, acute on chronic combined systolic and  diastolic heart failure, acute on chronic respiratory failure in patient's prognosis overall is very poor. She is currently not a candidate for an ICD but if her condition improves we would refer for ICD implantation.  She is a low-dose metoprolol 12.5 mg by mouth twice a day, if she continues to have runs of VT will consider adding amiodarone. Please maintain potassium above 4 and magnesium about 2.  Tobias AlexanderKatarina Amarri Michaelson, MD 01/27/2017

## 2017-01-27 NOTE — Progress Notes (Signed)
Patient refused evening dose of lasix and stated she will take tomorrow's dose.  Stated she believes her body has had too much lasix today based on how much she has used bathroom.  Patient alert and oriented.

## 2017-01-27 NOTE — Telephone Encounter (Signed)
Patient is hospitalized and wants clinical staff to call her to discuss why the hospital is keeping her blood sugar so high. Call patient to advise as soon as possible.

## 2017-01-27 NOTE — Plan of Care (Signed)
Problem: Tissue Perfusion: Goal: Risk factors for ineffective tissue perfusion will decrease Outcome: Progressing Pt was previously on heparin gtt. Pt refused any VTE injections in abdomen. Pt now okay with lovenox in abdomen. She verbalized understanding of medication.

## 2017-01-27 NOTE — Progress Notes (Signed)
Inpatient Diabetes Program Recommendations  AACE/ADA: New Consensus Statement on Inpatient Glycemic Control (2015)  Target Ranges:  Prepandial:   less than 140 mg/dL      Peak postprandial:   less than 180 mg/dL (1-2 hours)      Critically ill patients:  140 - 180 mg/dL   Results for Sue Martin, [name] (MRN [id number]) as of 01/27/2017 08:33  Ref. Range 01/26/2017 07:30 01/26/2017 11:44 01/26/2017 16:38 01/26/2017 21:22 01/27/2017 07:36  Glucose-Capillary Latest Ref Range: 65 - 99 mg/dL 846119 (H) 962153 (H) 952204 (H) 202 (H) 126 (H)   Review of Glycemic Control  Diabetes history:DM2 Outpatient Diabetes medications: Tresiba 30 untis QAM, Novolog 35 units TID with meals Current orders for Inpatient glycemic control: Lantus 20 units QAM, Novolog 0-9 units TID with meals  Inpatient Diabetes Program Recommendations: Correction (SSI):Please consider ordering Novolog 0-5 units QHS for bedtime correction scale. Insulin - Meal Coverage: Post prandial glucose consistently elevated. Please consider ordering Novolog 2 units TID with meals for meal coverage if patient eats at least 50% of meals.  Thanks, Orlando PennerMarie Madex Seals, RN, MSN, CDE Diabetes Coordinator Inpatient Diabetes Program [phone] (Team Pager from 8am to 5pm)

## 2017-01-27 NOTE — Care Management Important Message (Signed)
Important Message  Patient Details  Name: Sue Martin MRN: [id number] Date of Birth: [birth year]   Medicare Important Message Given:  Yes    Eliud Polo Stefan ChurchBratton 01/27/2017, 12:39 PM

## 2017-01-28 LAB — CBC
HEMATOCRIT: 43.7 % (ref 36.0–46.0)
HEMOGLOBIN: 13.8 g/dL (ref 12.0–15.0)
MCH: 30.3 pg (ref 26.0–34.0)
MCHC: 31.6 g/dL (ref 30.0–36.0)
MCV: 96 fL (ref 78.0–100.0)
Platelets: 94 10*3/uL — ABNORMAL LOW (ref 150–400)
RBC: 4.55 MIL/uL (ref 3.87–5.11)
RDW: 16.2 % — AB (ref 11.5–15.5)
WBC: 4.9 10*3/uL (ref 4.0–10.5)

## 2017-01-28 LAB — GLUCOSE, CAPILLARY
GLUCOSE-CAPILLARY: 123 mg/dL — AB (ref 65–99)
GLUCOSE-CAPILLARY: 119 mg/dL — AB (ref 65–99)
GLUCOSE-CAPILLARY: 154 mg/dL — AB (ref 65–99)

## 2017-01-28 LAB — BASIC METABOLIC PANEL
ANION GAP: 10 (ref 5–15)
BUN: 50 mg/dL — ABNORMAL HIGH (ref 6–20)
CHLORIDE: 96 mmol/L — AB (ref 101–111)
CO2: 31 mmol/L (ref 22–32)
Calcium: 9.4 mg/dL (ref 8.9–10.3)
Creatinine, Ser: 2.02 mg/dL — ABNORMAL HIGH (ref 0.44–1.00)
GFR, EST AFRICAN AMERICAN: 32 mL/min — AB (ref >60)
GFR, EST NON AFRICAN AMERICAN: 28 mL/min — AB (ref >60)
Glucose, Bld: 175 mg/dL — ABNORMAL HIGH (ref 65–99)
POTASSIUM: 4.8 mmol/L (ref 3.5–5.1)
SODIUM: 137 mmol/L (ref 135–145)

## 2017-01-28 LAB — MAGNESIUM: Magnesium: 1.9 mg/dL (ref 1.7–2.4)

## 2017-01-28 MED ORDER — MAGNESIUM OXIDE 400 (241.3 MG) MG PO TABS
200.0000 mg | ORAL_TABLET | Freq: Every day | ORAL | Status: DC
  Administered 2017-01-28 – 2017-01-30 (×3): 200 mg via ORAL
  Filled 2017-01-28 (×3): qty 1

## 2017-01-28 NOTE — Progress Notes (Signed)
Family at bedside, update provided  Pt educated on magnesium PO supplements, pt verbalizes understanding

## 2017-01-28 NOTE — Progress Notes (Signed)
PROGRESS NOTE  Sue Martin ZOX:[id number] DOB: 11/03/1965 DOA: 01/22/2017 PCP: Terressa KoyanagiKim, [name], DO   Brief Summary:   Hx of CAD, stent placement, sCHF with EF 39%,  liver cirrhosis, thrombocytopenia, insulin dependent dm2, bilateral BKA, retinal detachment,  who presents with chest tightness, sob, edema, cxr consistent with chf, negative troponin-->IV lasix.  Unable to get VQ scan but low probability for PE as such discontinued.   HPI/Recap of past 24 hours: Pt has no new complaints reported today.  Would like to eat.   Assessment/Plan: Principal Problem:   Acute on chronic systolic CHF (congestive heart failure) (HCC) Active Problems:   Type 2 diabetes mellitus with diabetic nephropathy (HCC)   Cirrhosis of liver without ascites (HCC)   S/P bilateral BKA (below knee amputation) (HCC)   CAD (coronary artery disease)   CKD (chronic kidney disease) stage 3, GFR 30-59 ml/min   Hyperlipidemia   Chest pain   Acute on chronic respiratory failure with hypoxia (HCC)   Thrombocytopenia (HCC)   Acute respiratory failure with hypoxia due to acute on chronic combined systolic/diastolic CHF (baseline not o2 dependent): - low index of suspicion for PE as such heparin d/c'd - Cardiology consulted for assistance with diureses and optimization of medications.  - Continue supplemental oxygen.  - Hold lasix for today and reassess Serum creatinine levels. Placed order for BMP (daily)  Nonsustained V. Tach - Cardiology assisting.  - Magnesium 1. 9 Will Pl. on magnesium oral replacement to try to keep magnesium levels above 2.0. Potassium levels above 4.0 on last check  Chest pain and hx of CAD: s/p stent. Patient's chest pain has resolved currently. Most likely due to demand ischemia secondary to CHF exacerbation. - ekg no acute st/t changes - When necessary morphine and nitroglycerin for pain - On aspirin/Lipitor/betablocker - troponin negative 3 , 2-D echo "Diffuse hypokinesis. There is  akinesis of the apical myocardium" - No chest pain reported  Hypokalemia:  - Resolved K on last check 4.8 - Magnesium within normal limits  Nausea, vomiting (presenting complaints):   - resolved  Cirrhosis of liver without ascites (HCC):  - Ammonia level  Elevated at 53 on admission though no confusion, tbili elevated at 3.8, trending down from last check.  - repeat LFT reporting normal values with ALT below normal values.  Thrombocytopenia (HCC): This is a chronic issue. Most likely due to liver cirrhosis. No bleeding tendency. Platelet 79-86 since admission, was 115 in 08/2016 - heparin gtt discontinued. platelett count stable at 84  Insulin dependent DM-II: Last A1c 8.7, poorly controled.  - continue current lantus dose. Continue carb modified diet -SSI  CKD-III: Stable. Baseline creatinine 1.1-1.3. Her creatinine is 1.33, BUN 2017. -Serum creatinine a little higher above baseline and on last check 1.9  HLD: -lipitor, stable no chest pain reported.  Bilateral amputee/ wheelchair dependent and visual impairment at baseline from retinal detachment.  Body mass index is 56.5 kg/m.   DVT ppx: patient is double amputee, not able to use scd, Heparin sq Code Status: Full code Family Communication: discussed with pt directly patient alert and awake and sons at bedside Disposition Plan:  Anticipate discharge back to previous home environment, likely will need home health Consults called: Cardiology   Procedures:  none  Antibiotics:  none   Objective: BP (!) 113/52 (BP Location: Right Arm)   Pulse 66   Temp (!) 97.4 F (36.3 C) (Oral)   Resp 18   Ht 4\' 7"  (1.397 m)   Wt  110.3 kg (243 lb 1.6 oz)   SpO2 95%   BMI 56.50 kg/m   Intake/Output Summary (Last 24 hours) at 01/28/17 1343 Last data filed at 01/28/17 1100  Gross per 24 hour  Intake              720 ml  Output             1775 ml  Net            -1055 ml   Filed Weights   01/26/17 0429 01/27/17  0429 01/28/17 0505  Weight: 104.1 kg (229 lb 9.6 oz) 112.4 kg (247 lb 11.2 oz) 110.3 kg (243 lb 1.6 oz)    Exam:Exam unchanged when compared 01/27/2017   General:  Pt in nad, alert and awake  Cardiovascular: RRR, no gallops or rubs  Respiratory: overall diminished, + scatters crackles, no wheezing, no rhonchi  Abdomen: Soft/ND/NT, positive BS, + abdominal wall edema,   Musculoskeletal: stump Edema  Neuro: aaox3  Data Reviewed: Basic Metabolic Panel:  Recent Labs Lab 01/23/17 0603 01/24/17 0329 01/25/17 0243 01/26/17 0039 01/27/17 0412 01/28/17 0032 01/28/17 0330  NA 141 139 138 139 140 137  --   K 2.9* 3.8 3.8 4.1 4.4 4.8  --   CL 94* 95* 94* 94* 93* 96*  --   CO2 37* 34* 35* 34* 34* 31  --   GLUCOSE 106* 191* 199* 158* 136* 175*  --   BUN 27* 30* 35* 38* 49* 50*  --   CREATININE 1.39* 1.65* 1.85* 1.87* 1.99* 2.02*  --   CALCIUM 9.4 8.9 9.2 9.1 9.5 9.4  --   MG 2.0 1.9  --   --   --   --  1.9   Liver Function Tests:  Recent Labs Lab 01/22/17 2126 01/24/17 0329 01/25/17 0243  AST 22 19 20   ALT 10* 11* 10*  ALKPHOS 108 101 115  BILITOT 4.6* 4.0* 3.8*  PROT 6.8 6.0* 6.7  ALBUMIN 3.3* 3.1* 3.2*    Recent Labs Lab 01/22/17 2126  LIPASE 31    Recent Labs Lab 01/22/17 2126  AMMONIA 53*   CBC:  Recent Labs Lab 01/25/17 0243 01/26/17 0039 01/26/17 0323 01/27/17 0412 01/28/17 0330  WBC 6.3 5.4 5.8 4.6 4.9  HGB 14.9 14.6 14.4 14.5 13.8  HCT 47.0* 46.0 45.4 45.1 43.7  MCV 95.9 95.4 95.6 96.4 96.0  PLT 95* 85* 84* 91* 94*   Cardiac Enzymes:    Recent Labs Lab 01/23/17 1531 01/23/17 1937  TROPONINI 0.04* <0.03   BNP (last 3 results)  Recent Labs  01/22/17 1723  BNP 474.8*    ProBNP (last 3 results) No results for input(s): PROBNP in the last 8760 hours.  CBG:  Recent Labs Lab 01/27/17 1117 01/27/17 1651 01/27/17 2119 01/28/17 0735 01/28/17 1121  GLUCAP 175* 185* 195* 123* 119*    No results found for this or any  previous visit (from the past 240 hour(s)).   Studies: Dg Chest Port 1 View  Result Date: 01/27/2017 CLINICAL DATA:  CHF. EXAM: PORTABLE CHEST 1 VIEW COMPARISON:  Chest x-ray dated 01/22/2017. FINDINGS: Stable cardiomegaly. Again noted is central pulmonary vascular congestion and bilateral interstitial edema, stable. Probable small right pleural effusion. IMPRESSION: Continued CHF, stable appearance compared to chest x-ray of 01/22/2017. Electronically Signed   By: Bary RichardStan  Maynard M.D.   On: 01/27/2017 17:32    Scheduled Meds: . aspirin  325 mg Oral QHS  . atorvastatin  20 mg Oral  Daily  . enoxaparin (LOVENOX) injection  40 mg Subcutaneous Q24H  . hydrALAZINE  10 mg Oral BID  . insulin aspart  0-9 Units Subcutaneous TID WC  . insulin glargine  20 Units Subcutaneous Q breakfast  . isosorbide mononitrate  15 mg Oral Daily  . metoprolol tartrate  12.5 mg Oral BID  . potassium chloride  40 mEq Oral Daily  . sodium chloride flush  3 mL Intravenous Q12H    Continuous Infusions: . sodium chloride      Time spent:  Penny Pia MD, PhD  [facility] Pager [phone]. If 7PM-7AM, please contact night-coverage at [email or web address], password [facility] 01/28/2017, 1:43 PM  LOS: 6 days

## 2017-01-28 NOTE — Progress Notes (Signed)
Cardiology aware of pt unable to tolerate NM pulmonary test. No order for stress test, MD stated to wait for rounding and keep pt NPO at this time

## 2017-01-28 NOTE — Progress Notes (Signed)
Pt states she will not take medications without food. Pt states if she takes it on an empty stomach she will throw up. Awaiting MD cardiology to round if pt needs to continue NPO

## 2017-01-28 NOTE — Progress Notes (Signed)
Progress Note  Patient Name: Sue Martin Date of Encounter: 01/28/2017  Primary Cardiologist: Dr. Gwen HerUsman Khawaja, MD  Subjective   No chest pain.  She prefers her old diuretics, torsemide and metolazone to Lasix.  She thinks that Lasix is too strong.  Inpatient Medications    Scheduled Meds: . aspirin  325 mg Oral QHS  . atorvastatin  20 mg Oral Daily  . enoxaparin (LOVENOX) injection  40 mg Subcutaneous Q24H  . furosemide  80 mg Intravenous Q12H  . hydrALAZINE  10 mg Oral BID  . insulin aspart  0-9 Units Subcutaneous TID WC  . insulin glargine  20 Units Subcutaneous Q breakfast  . isosorbide mononitrate  15 mg Oral Daily  . metoprolol tartrate  12.5 mg Oral BID  . potassium chloride  40 mEq Oral Daily  . sodium chloride flush  3 mL Intravenous Q12H  . spironolactone  25 mg Oral QHS   Continuous Infusions: . sodium chloride     PRN Meds: sodium chloride, acetaminophen, benzonatate, cyclobenzaprine, hydrALAZINE, morphine injection, nitroGLYCERIN, ondansetron, oxymetazoline, sodium chloride flush, zolpidem   Vital Signs    Vitals:   01/27/17 1151 01/27/17 2123 01/28/17 0505 01/28/17 1152  BP: (!) 100/59 (!) 101/55 (!) 126/94 (!) 113/52  Pulse: 74 82 70 66  Resp: 18 18 18 18   Temp: (!) 97.5 F (36.4 C) 98.3 F (36.8 C) 98.2 F (36.8 C) (!) 97.4 F (36.3 C)  TempSrc: Oral Oral Oral Oral  SpO2: 92% 92% 91% 95%  Weight:   243 lb 1.6 oz (110.3 kg)   Height:        Intake/Output Summary (Last 24 hours) at 01/28/17 1304 Last data filed at 01/28/17 1100  Gross per 24 hour  Intake              720 ml  Output             1775 ml  Net            -1055 ml   Filed Weights   01/26/17 0429 01/27/17 0429 01/28/17 0505  Weight: 229 lb 9.6 oz (104.1 kg) 247 lb 11.2 oz (112.4 kg) 243 lb 1.6 oz (110.3 kg)    Telemetry    NSR- Personally Reviewed  ECG    7/30 NSR, PVC - Personally Reviewed  Physical Exam   GEN: No acute distress.   Neck: No JVD Cardiac: RRR, no  murmurs, rubs, or gallops.  Respiratory: Clear to auscultation bilaterally. GI: Soft, nontender, non-distended  MS: No edema; bilateral leg amputatons Neuro:  Nonfocal  Psych: Normal affect   Labs    Chemistry Recent Labs Lab 01/22/17 2126  01/24/17 0329 01/25/17 0243 01/26/17 0039 01/27/17 0412 01/28/17 0032  NA  --   < > 139 138 139 140 137  K  --   < > 3.8 3.8 4.1 4.4 4.8  CL  --   < > 95* 94* 94* 93* 96*  CO2  --   < > 34* 35* 34* 34* 31  GLUCOSE  --   < > 191* 199* 158* 136* 175*  BUN  --   < > 30* 35* 38* 49* 50*  CREATININE  --   < > 1.65* 1.85* 1.87* 1.99* 2.02*  CALCIUM  --   < > 8.9 9.2 9.1 9.5 9.4  PROT 6.8  --  6.0* 6.7  --   --   --   ALBUMIN 3.3*  --  3.1* 3.2*  --   --   --  AST 22  --  19 20  --   --   --   ALT 10*  --  11* 10*  --   --   --   ALKPHOS 108  --  101 115  --   --   --   BILITOT 4.6*  --  4.0* 3.8*  --   --   --   GFRNONAA  --   < > 35* 31* 30* 28* 28*  GFRAA  --   < > 41* 36* 35* 33* 32*  ANIONGAP  --   < > 10 9 11 13 10   < > = values in this interval not displayed.   Hematology Recent Labs Lab 01/26/17 0323 01/27/17 0412 01/28/17 0330  WBC 5.8 4.6 4.9  RBC 4.75 4.68 4.55  HGB 14.4 14.5 13.8  HCT 45.4 45.1 43.7  MCV 95.6 96.4 96.0  MCH 30.3 31.0 30.3  MCHC 31.7 32.2 31.6  RDW 15.8* 16.3* 16.2*  PLT 84* 91* 94*    Cardiac Enzymes Recent Labs Lab 01/23/17 1531 01/23/17 1937  TROPONINI 0.04* <0.03    Recent Labs Lab 01/22/17 1747  TROPIPOC 0.02     BNP Recent Labs Lab 01/22/17 1723  BNP 474.8*     DDimer  Recent Labs Lab 01/26/17 0325  DDIMER 0.53*     Radiology    Dg Chest Port 1 View  Result Date: 01/27/2017 CLINICAL DATA:  CHF. EXAM: PORTABLE CHEST 1 VIEW COMPARISON:  Chest x-ray dated 01/22/2017. FINDINGS: Stable cardiomegaly. Again noted is central pulmonary vascular congestion and bilateral interstitial edema, stable. Probable small right pleural effusion. IMPRESSION: Continued CHF, stable appearance  compared to chest x-ray of 01/22/2017. Electronically Signed   By: Bary RichardStan  Maynard M.D.   On: 01/27/2017 17:32    Cardiac Studies   As noted below  Patient Profile     51 y.o. female acute on chronic systolic heart failure andmultiple medical issues  Assessment & Plan    1) Acute on chronic systolic heart failure: Vascular congestion noted on xray.  Cr increasing with diuretics.  Hold diuretics today.  Can restart when Cr has started to come down.  She prefers her torsemide/metolazone combination from Dr. Marisue HumbleSanford.  No need for stress test at this time.  No anginal sx.    2)Acute on chronic renal failure, : daily BMet.   01/23/17: 30-35% LVEF  D/w Dr. Cena BentonVega.  Signed, Lance MussJayadeep Marieme Mcmackin, MD  01/28/2017, 1:04 PM

## 2017-01-28 NOTE — Progress Notes (Signed)
Cardiology and Cena BentonVega came to bedside, both stated pt may have diet order No stress test and no pulmonary perf and vent today  Pt aware

## 2017-01-28 NOTE — Progress Notes (Signed)
NM called to ask if pt could lay flat, tried to lay pt flat on two pillows, pt unable to tolerate. NM aware. NM stated she will call physician

## 2017-01-28 NOTE — Progress Notes (Signed)
MD order to hold insulin MD aware pt unable to lay flat  MD aware cardiology aware

## 2017-01-28 NOTE — Progress Notes (Signed)
MD Cena BentonVega aware pt will not take medications until she has food on her stomach, no new orders at this time

## 2017-01-28 NOTE — Progress Notes (Signed)
Ordered pt lunch tray. Pt agreeable to take some medications at this time

## 2017-01-29 LAB — BASIC METABOLIC PANEL
Anion gap: 11 (ref 5–15)
BUN: 46 mg/dL — AB (ref 6–20)
CALCIUM: 9.6 mg/dL (ref 8.9–10.3)
CO2: 32 mmol/L (ref 22–32)
Chloride: 96 mmol/L — ABNORMAL LOW (ref 101–111)
Creatinine, Ser: 2.1 mg/dL — ABNORMAL HIGH (ref 0.44–1.00)
GFR calc Af Amer: 31 mL/min — ABNORMAL LOW (ref >60)
GFR, EST NON AFRICAN AMERICAN: 26 mL/min — AB (ref >60)
GLUCOSE: 138 mg/dL — AB (ref 65–99)
Potassium: 4.6 mmol/L (ref 3.5–5.1)
SODIUM: 139 mmol/L (ref 135–145)

## 2017-01-29 LAB — GLUCOSE, CAPILLARY
GLUCOSE-CAPILLARY: 131 mg/dL — AB (ref 65–99)
GLUCOSE-CAPILLARY: 152 mg/dL — AB (ref 65–99)
Glucose-Capillary: 221 mg/dL — ABNORMAL HIGH (ref 65–99)
Glucose-Capillary: 216 mg/dL — ABNORMAL HIGH (ref 65–99)

## 2017-01-29 LAB — CBC
HCT: 43.8 % (ref 36.0–46.0)
HEMOGLOBIN: 13.8 g/dL (ref 12.0–15.0)
MCH: 30.2 pg (ref 26.0–34.0)
MCHC: 31.5 g/dL (ref 30.0–36.0)
MCV: 95.8 fL (ref 78.0–100.0)
PLATELETS: 87 10*3/uL — AB (ref 150–400)
RBC: 4.57 MIL/uL (ref 3.87–5.11)
RDW: 16 % — ABNORMAL HIGH (ref 11.5–15.5)
WBC: 5.4 10*3/uL (ref 4.0–10.5)

## 2017-01-29 NOTE — Progress Notes (Signed)
Progress Note  Patient Name: Sue Martin Date of Encounter: 01/29/2017  Primary Cardiologist: Dr. Gwen HerUsman Khawaja  Subjective   Feels okay. Able to lie flat. Wondering when she'll go home.  Inpatient Medications    Scheduled Meds: . aspirin  325 mg Oral QHS  . atorvastatin  20 mg Oral Daily  . enoxaparin (LOVENOX) injection  40 mg Subcutaneous Q24H  . hydrALAZINE  10 mg Oral BID  . insulin aspart  0-9 Units Subcutaneous TID WC  . insulin glargine  20 Units Subcutaneous Q breakfast  . isosorbide mononitrate  15 mg Oral Daily  . magnesium oxide  200 mg Oral Daily  . metoprolol tartrate  12.5 mg Oral BID  . potassium chloride  40 mEq Oral Daily  . sodium chloride flush  3 mL Intravenous Q12H   Continuous Infusions: . sodium chloride     PRN Meds: sodium chloride, acetaminophen, benzonatate, cyclobenzaprine, hydrALAZINE, morphine injection, nitroGLYCERIN, ondansetron, oxymetazoline, sodium chloride flush, zolpidem   Vital Signs    Vitals:   01/28/17 1928 01/28/17 2122 01/29/17 0526 01/29/17 0620  BP:  128/66  118/69  Pulse:  78  66  Resp:  17 16   Temp:  97.6 F (36.4 C)  98 F (36.7 C)  TempSrc:  Oral  Oral  SpO2: 96% 94% 96% 95%  Weight:      Height:        Intake/Output Summary (Last 24 hours) at 01/29/17 1007 Last data filed at 01/29/17 0901  Gross per 24 hour  Intake              720 ml  Output             1101 ml  Net             -381 ml   Filed Weights   01/26/17 0429 01/27/17 0429 01/28/17 0505  Weight: 229 lb 9.6 oz (104.1 kg) 247 lb 11.2 oz (112.4 kg) 243 lb 1.6 oz (110.3 kg)    Telemetry    Normal sinus rhythm - Personally Reviewed  ECG    None recently - Personally Reviewed  Physical Exam   GEN: No acute distress. Able to lie flat  Neck: No JVD Cardiac: RRR, no murmurs, rubs, or gallops.  Respiratory: Clear to auscultation bilaterally. GI: Soft, nontender, non-distended , obese MS: No edema; bilateral leg amputations Neuro:   Nonfocal  Psych: Normal affect   Labs    Chemistry Recent Labs Lab 01/22/17 2126  01/24/17 0329 01/25/17 0243  01/27/17 0412 01/28/17 0032 01/29/17 0337  NA  --   < > 139 138  < > 140 137 139  K  --   < > 3.8 3.8  < > 4.4 4.8 4.6  CL  --   < > 95* 94*  < > 93* 96* 96*  CO2  --   < > 34* 35*  < > 34* 31 32  GLUCOSE  --   < > 191* 199*  < > 136* 175* 138*  BUN  --   < > 30* 35*  < > 49* 50* 46*  CREATININE  --   < > 1.65* 1.85*  < > 1.99* 2.02* 2.10*  CALCIUM  --   < > 8.9 9.2  < > 9.5 9.4 9.6  PROT 6.8  --  6.0* 6.7  --   --   --   --   ALBUMIN 3.3*  --  3.1* 3.2*  --   --   --   --  AST 22  --  19 20  --   --   --   --   ALT 10*  --  11* 10*  --   --   --   --   ALKPHOS 108  --  101 115  --   --   --   --   BILITOT 4.6*  --  4.0* 3.8*  --   --   --   --   GFRNONAA  --   < > 35* 31*  < > 28* 28* 26*  GFRAA  --   < > 41* 36*  < > 33* 32* 31*  ANIONGAP  --   < > 10 9  < > 13 10 11   < > = values in this interval not displayed.   Hematology Recent Labs Lab 01/27/17 0412 01/28/17 0330 01/29/17 0042  WBC 4.6 4.9 5.4  RBC 4.68 4.55 4.57  HGB 14.5 13.8 13.8  HCT 45.1 43.7 43.8  MCV 96.4 96.0 95.8  MCH 31.0 30.3 30.2  MCHC 32.2 31.6 31.5  RDW 16.3* 16.2* 16.0*  PLT 91* 94* 87*    Cardiac Enzymes Recent Labs Lab 01/23/17 1531 01/23/17 1937  TROPONINI 0.04* <0.03    Recent Labs Lab 01/22/17 1747  TROPIPOC 0.02     BNP Recent Labs Lab 01/22/17 1723  BNP 474.8*     DDimer  Recent Labs Lab 01/26/17 0325  DDIMER 0.53*     Radiology    Dg Chest Port 1 View  Result Date: 01/27/2017 CLINICAL DATA:  CHF. EXAM: PORTABLE CHEST 1 VIEW COMPARISON:  Chest x-ray dated 01/22/2017. FINDINGS: Stable cardiomegaly. Again noted is central pulmonary vascular congestion and bilateral interstitial edema, stable. Probable small right pleural effusion. IMPRESSION: Continued CHF, stable appearance compared to chest x-ray of 01/22/2017. Electronically Signed   By: Bary RichardStan   Maynard M.D.   On: 01/27/2017 17:32    Cardiac Studies   LVEF 30-35%  Patient Profile     [age] y.o. female with multiple medical issues including chronic systolic heart failure.  Assessment & Plan    1) Acute on chronic renal failure: Creatinine slightly increased today. Continue to hold diuretics. Restart oral diuretics after creatinine starts to trend towards baseline.  Systolic failure appears to be well compensated at this time. The patient feels she may even be a little dehydrated.  SignedLance Muss, Aalayah Riles, MD  01/29/2017, 10:07 AM

## 2017-01-29 NOTE — Progress Notes (Signed)
PROGRESS NOTE  Roneka Danese ZOX:[id number] DOB: [birth year] DOA: 01/22/2017 PCP: Terressa Koyanagi, DO   Brief Summary:   Hx of CAD, stent placement, sCHF with EF 39%,  liver cirrhosis, thrombocytopenia, insulin dependent dm2, bilateral BKA, retinal detachment,  who presents with chest tightness, sob, edema, cxr consistent with chf, negative troponin-->IV lasix. Unable to get VQ scan but low probability for PE as such discontinued.  CAD, stent placement, sCHF with EF 39%,  liver cirrhosis, thrombocytopenia, insulin dependent dm2, bilateral BKA, retinal detachment,  who presents with chest tightness, sob, edema, cxr consistent with chf, negative troponin--> Pt has required oxygen supplementation despite being on lasix and serum creatinine continues to trend up. Holding lasix. Unable to get VQ scan but low probability for PE as such discontinued  Assessment/Plan: Principal Problem:   Acute on chronic systolic CHF (congestive heart failure) (HCC) Active Problems:   Type 2 diabetes mellitus with diabetic nephropathy (HCC)   Cirrhosis of liver without ascites (HCC)   S/P bilateral BKA (below knee amputation) (HCC)   CAD (coronary artery disease)   CKD (chronic kidney disease) stage 3, GFR 30-59 ml/min   Hyperlipidemia   Chest pain   Acute on chronic respiratory failure with hypoxia (HCC)   Thrombocytopenia (HCC)   Acute respiratory failure with hypoxia due to acute on chronic combined systolic/diastolic CHF (baseline not o2 dependent): - low index of suspicion for PE as such heparin d/c'd - Cardiology consulted for assistance with diureses and optimization of medications. Per cardiology evaluation patient from a systolic heart failure standpoint appears to be well compensated at this time. - We'll assess patient off supplemental oxygen at rest and with her usual activity to see if patient qualifies for supplemental oxygen at home. - Continue to hold lasix for today and reassess Serum  creatinine levels. Placed order for BMP (daily)  Nonsustained V. Tach - Cardiology assisting.  - Magnesium 1. 9 Will Pl. on magnesium oral replacement to try to keep magnesium levels above 2.0. Potassium levels above 4.0 on last check  Chest pain and hx of CAD: s/p stent. Patient's chest pain has resolved currently. Most likely due to demand ischemia secondary to CHF exacerbation. - ekg no acute st/t changes - When necessary morphine and nitroglycerin for pain - On aspirin/Lipitor/betablocker - troponin negative 3 , 2-D echo "Diffuse hypokinesis. There is akinesis of the apical myocardium" - No chest pain reported  Hypokalemia:  - Resolved K on last check 4.8888 - Magnesium within normal limits on last check  Nausea, vomiting (presenting complaints):   - resolved  Cirrhosis of liver without ascites (HCC):  - Ammonia level  Elevated at 53 on admission though no confusion, tbili elevated at 3.8, trending down from last check.  - repeat LFT reporting normal values with ALT below normal values.  Thrombocytopenia (HCC): This is a chronic issue. Most likely due to liver cirrhosis. No bleeding tendency. Platelet 79-86 since admission, was 115 in 08/2016 - heparin gtt discontinued. platelett count stable at 84  Insulin dependent DM-II: Last A1c 8.7, poorly controled.  - continue current lantus dose. Continue carb modified diet -SSI  CKD-III: Stable. Baseline creatinine 1.1-1.3. Her creatinine is 1.33, BUN 2017. -Serum creatinine a little higher above baseline and on last check 1.9  HLD: -lipitor, stable no chest pain reported.  Bilateral amputee/ wheelchair dependent and visual impairment at baseline from retinal detachment.  Body mass index is 56.64 kg/m.   DVT ppx: patient is double amputee, not able to use scd,  Heparin sq Code Status: Full code Family Communication: discussed with pt directly  Disposition Plan:  Anticipate discharge back to previous home environment,  likely will need home health as well as supplemental oxygen Consults called: Cardiology   Procedures:  none  Antibiotics:  none   Objective: BP 118/69 (BP Location: Left Arm)   Pulse 66   Temp 98 F (36.7 C) (Oral)   Resp 16   Ht 4\' 7"  (1.397 m)   Wt 110.5 kg (243 lb 11.2 oz) Comment: bed scale  SpO2 95%   BMI 56.64 kg/m   Intake/Output Summary (Last 24 hours) at 01/29/17 1126 Last data filed at 01/29/17 1029  Gross per 24 hour  Intake              720 ml  Output             1401 ml  Net             -681 ml   Filed Weights   01/27/17 0429 01/28/17 0505 01/29/17 1039  Weight: 112.4 kg (247 lb 11.2 oz) 110.3 kg (243 lb 1.6 oz) 110.5 kg (243 lb 11.2 oz)    Exam:Exam unchanged when compared 01/28/2017   General:  Pt in nad, alert and awake  Cardiovascular: RRR, no gallops or rubs  Respiratory: overall diminished, + scatters crackles, no wheezing, no rhonchi  Abdomen: Soft/ND/NT, positive BS, + abdominal wall edema,   Musculoskeletal: stump Edema  Neuro: aaox3  Data Reviewed: Basic Metabolic Panel:  Recent Labs Lab 01/23/17 0603 01/24/17 0329 01/25/17 0243 01/26/17 0039 01/27/17 0412 01/28/17 0032 01/28/17 0330 01/29/17 0337  NA 141 139 138 139 140 137  --  139  K 2.9* 3.8 3.8 4.1 4.4 4.8  --  4.6  CL 94* 95* 94* 94* 93* 96*  --  96*  CO2 37* 34* 35* 34* 34* 31  --  32  GLUCOSE 106* 191* 199* 158* 136* 175*  --  138*  BUN 27* 30* 35* 38* 49* 50*  --  46*  CREATININE 1.39* 1.65* 1.85* 1.87* 1.99* 2.02*  --  2.10*  CALCIUM 9.4 8.9 9.2 9.1 9.5 9.4  --  9.6  MG 2.0 1.9  --   --   --   --  1.9  --    Liver Function Tests:  Recent Labs Lab 01/22/17 2126 01/24/17 0329 01/25/17 0243  AST 22 19 20   ALT 10* 11* 10*  ALKPHOS 108 101 115  BILITOT 4.6* 4.0* 3.8*  PROT 6.8 6.0* 6.7  ALBUMIN 3.3* 3.1* 3.2*    Recent Labs Lab 01/22/17 2126  LIPASE 31    Recent Labs Lab 01/22/17 2126  AMMONIA 53*   CBC:  Recent Labs Lab 01/26/17 0039  01/26/17 0323 01/27/17 0412 01/28/17 0330 01/29/17 0042  WBC 5.4 5.8 4.6 4.9 5.4  HGB 14.6 14.4 14.5 13.8 13.8  HCT 46.0 45.4 45.1 43.7 43.8  MCV 95.4 95.6 96.4 96.0 95.8  PLT 85* 84* 91* 94* 87*   Cardiac Enzymes:    Recent Labs Lab 01/23/17 1531 01/23/17 1937  TROPONINI 0.04* <0.03   BNP (last 3 results)  Recent Labs  01/22/17 1723  BNP 474.8*    ProBNP (last 3 results) No results for input(s): PROBNP in the last 8760 hours.  CBG:  Recent Labs Lab 01/27/17 2119 01/28/17 0735 01/28/17 1121 01/28/17 1634 01/29/17 0735  GLUCAP 195* 123* 119* 154* 131*    No results found for this or any previous visit (  from the past 240 hour(s)).   Studies: No results found.  Scheduled Meds: . aspirin  325 mg Oral QHS  . atorvastatin  20 mg Oral Daily  . enoxaparin (LOVENOX) injection  40 mg Subcutaneous Q24H  . hydrALAZINE  10 mg Oral BID  . insulin aspart  0-9 Units Subcutaneous TID WC  . insulin glargine  20 Units Subcutaneous Q breakfast  . isosorbide mononitrate  15 mg Oral Daily  . magnesium oxide  200 mg Oral Daily  . metoprolol tartrate  12.5 mg Oral BID  . potassium chloride  40 mEq Oral Daily  . sodium chloride flush  3 mL Intravenous Q12H    Continuous Infusions: . sodium chloride      Time spent: 35mins  Penny PiaVEGA, Grayden Burley MD, PhD  [facility] Pager [phone]. If 7PM-7AM, please contact night-coverage at [email or web address], password [email or web address] 01/29/2017, 11:26 AM  LOS: 7 days

## 2017-01-29 NOTE — Progress Notes (Signed)
SATURATION QUALIFICATIONS: (This note is used to comply with regulatory documentation for home oxygen)  Patient Saturations on Room Air at Rest = 84%    Please briefly explain why patient needs home oxygen:  Pt O2 sat dropped to 84% on RA at rest sitting up in bed.

## 2017-01-30 LAB — MAGNESIUM: Magnesium: 2.3 mg/dL (ref 1.7–2.4)

## 2017-01-30 LAB — BASIC METABOLIC PANEL
ANION GAP: 10 (ref 5–15)
BUN: 47 mg/dL — ABNORMAL HIGH (ref 6–20)
CALCIUM: 9.7 mg/dL (ref 8.9–10.3)
CO2: 31 mmol/L (ref 22–32)
CREATININE: 1.8 mg/dL — AB (ref 0.44–1.00)
Chloride: 97 mmol/L — ABNORMAL LOW (ref 101–111)
GFR calc Af Amer: 37 mL/min — ABNORMAL LOW (ref >60)
GFR, EST NON AFRICAN AMERICAN: 32 mL/min — AB (ref >60)
GLUCOSE: 170 mg/dL — AB (ref 65–99)
Potassium: 4.6 mmol/L (ref 3.5–5.1)
Sodium: 138 mmol/L (ref 135–145)

## 2017-01-30 LAB — GLUCOSE, CAPILLARY
GLUCOSE-CAPILLARY: 197 mg/dL — AB (ref 65–99)
Glucose-Capillary: 147 mg/dL — ABNORMAL HIGH (ref 65–99)
Glucose-Capillary: 216 mg/dL — ABNORMAL HIGH (ref 65–99)

## 2017-01-30 MED ORDER — TORSEMIDE 20 MG PO TABS: 20.0000 mg | ORAL_TABLET | Freq: Two times a day (BID) | ORAL | 5 refills | Status: DC

## 2017-01-30 MED ORDER — METOPROLOL TARTRATE 25 MG PO TABS: 12.5000 mg | ORAL_TABLET | Freq: Two times a day (BID) | ORAL | 0 refills | Status: DC

## 2017-01-30 MED ORDER — ISOSORBIDE MONONITRATE ER 30 MG PO TB24: 15.0000 mg | ORAL_TABLET | Freq: Every day | ORAL | 0 refills | Status: DC

## 2017-01-30 MED ORDER — METOLAZONE 5 MG PO TABS: 5.0000 mg | ORAL_TABLET | Freq: Every day | ORAL | 11 refills | Status: DC

## 2017-01-30 NOTE — Progress Notes (Signed)
Progress Note  Patient Name: Sue Martin Date of Encounter: 01/30/2017  Primary Cardiologist: Dr. Gwen Her  Subjective   Feeling well.  Wants to go home   Inpatient Medications    Scheduled Meds: . aspirin  325 mg Oral QHS  . atorvastatin  20 mg Oral Daily  . enoxaparin (LOVENOX) injection  40 mg Subcutaneous Q24H  . hydrALAZINE  10 mg Oral BID  . insulin aspart  0-9 Units Subcutaneous TID WC  . insulin glargine  20 Units Subcutaneous Q breakfast  . isosorbide mononitrate  15 mg Oral Daily  . magnesium oxide  200 mg Oral Daily  . metoprolol tartrate  12.5 mg Oral BID  . potassium chloride  40 mEq Oral Daily  . sodium chloride flush  3 mL Intravenous Q12H   Continuous Infusions: . sodium chloride     PRN Meds: sodium chloride, acetaminophen, benzonatate, cyclobenzaprine, hydrALAZINE, morphine injection, nitroGLYCERIN, ondansetron, oxymetazoline, sodium chloride flush, zolpidem   Vital Signs    Vitals:   01/29/17 1206 01/29/17 1500 01/29/17 2129 01/30/17 0624  BP: 106/62  118/63 108/74  Pulse: 90  73 71  Resp: 20  18   Temp: 97.8 F (36.6 C)  98 F (36.7 C) 97.8 F (36.6 C)  TempSrc: Oral  Oral Oral  SpO2: 95% (!) 84% 95% 96%  Weight:    109.8 kg (242 lb 1.6 oz)  Height:        Intake/Output Summary (Last 24 hours) at 01/30/17 0841 Last data filed at 01/30/17 0037  Gross per 24 hour  Intake              420 ml  Output             1050 ml  Net             -630 ml   Filed Weights   01/28/17 0505 01/29/17 1039 01/30/17 0624  Weight: 110.3 kg (243 lb 1.6 oz) 110.5 kg (243 lb 11.2 oz) 109.8 kg (242 lb 1.6 oz)    Telemetry    Sinus rhythm. PVCs - Personally Reviewed  ECG    N/a - Personally Reviewed  Physical Exam   VS:  BP 108/74 (BP Location: Left Arm)   Pulse 71   Temp 97.8 F (36.6 C) (Oral)   Resp 18   Ht 4\' 7"  (1.397 m)   Wt 109.8 kg (242 lb 1.6 oz) Comment: bed scale  SpO2 96%   BMI 56.27 kg/m  , BMI Body mass index is 56.27  kg/m. GENERAL:  Chronically ill-appearing.  No acute distress HEENT: Pupils equal round and reactive, fundi not visualized, oral mucosa unremarkable NECK:  No jugular venous distention, waveform within normal limits, carotid upstroke brisk and symmetric, no bruits LUNGS:  Clear to auscultation bilaterally HEART:  RRR.  PMI not displaced or sustained,S1 and S2 within normal limits, no S3, no S4, no clicks, no rubs, no murmurs ABD:  Flat, positive bowel sounds normal in frequency in pitch, no bruits, no rebound, no guarding, no midline pulsatile mass, no hepatomegaly, no splenomegaly EXT:  2 plus pulses throughout, no edema.  Bilateral AKA SKIN:  No rashes no nodules NEURO:  Cranial nerves II through XII grossly intact, motor grossly intact throughout [facility]:  Cognitively intact, oriented to person place and time   Labs    Chemistry Recent Labs Lab 01/24/17 0329 01/25/17 0243  01/28/17 0032 01/29/17 0337 01/30/17 0346  NA 139 138  < > 137 139 138  K 3.8 3.8  < > 4.8 4.6 4.6  CL 95* 94*  < > 96* 96* 97*  CO2 34* 35*  < > 31 32 31  GLUCOSE 191* 199*  < > 175* 138* 170*  BUN 30* 35*  < > 50* 46* 47*  CREATININE 1.65* 1.85*  < > 2.02* 2.10* 1.80*  CALCIUM 8.9 9.2  < > 9.4 9.6 9.7  PROT 6.0* 6.7  --   --   --   --   ALBUMIN 3.1* 3.2*  --   --   --   --   AST 19 20  --   --   --   --   ALT 11* 10*  --   --   --   --   ALKPHOS 101 115  --   --   --   --   BILITOT 4.0* 3.8*  --   --   --   --   GFRNONAA 35* 31*  < > 28* 26* 32*  GFRAA 41* 36*  < > 32* 31* 37*  ANIONGAP 10 9  < > 10 11 10   < > = values in this interval not displayed.   Hematology Recent Labs Lab 01/27/17 0412 01/28/17 0330 01/29/17 0042  WBC 4.6 4.9 5.4  RBC 4.68 4.55 4.57  HGB 14.5 13.8 13.8  HCT 45.1 43.7 43.8  MCV 96.4 96.0 95.8  MCH 31.0 30.3 30.2  MCHC 32.2 31.6 31.5  RDW 16.3* 16.2* 16.0*  PLT 91* 94* 87*    Cardiac Enzymes Recent Labs Lab 01/23/17 1531 01/23/17 1937  TROPONINI 0.04* <0.03     No results for input(s): TROPIPOC in the last 168 hours.   BNPNo results for input(s): BNP, PROBNP in the last 168 hours.   DDimer  Recent Labs Lab 01/26/17 0325  DDIMER 0.53*     Radiology    No results found.  Cardiac Studies   Echo 01/23/17:   Study Conclusions  - Left ventricle: The cavity size was normal. Wall thickness was   normal. Systolic function was moderately to severely reduced. The   estimated ejection fraction was in the range of 30% to 35%.   Diffuse hypokinesis. There is akinesis of the apical myocardium.   Doppler parameters are consistent with a reversible restrictive   pattern, indicative of decreased left ventricular diastolic   compliance and/or increased left atrial pressure (grade 3   diastolic dysfunction). - Left atrium: The atrium was mildly dilated. - Pulmonic valve: There was moderate regurgitation.  Patient Profile     51 y.o. female with chronic systolic and diastolic heart failure, CAD status post PCI, cirrhosis, thrombocytopenia, diabetes, status post bilateral BKA, and retinal detachment here with acute on chronic systolic and diastolic heart failure.  Assessment & Plan    # Acute on chronic systolic and diastolic heart failure: Weight is down from 243 yesterday to 242 today.  She was net -600 mL off diuretics. Diuretics have been held since 8/4.  Her baseline creatinine is around 1.3.  I have instructed her to hold her diuretics until 8/8.  At that time she will resume her home metolazone and torsemide.  Follow up up with her nephrologist next Monday for BMP.  She will need to follow up with her cardiologist within two weeks.  Diuretics may need to be titrated.  However, given her acute on chronic renal failure in the hospital, I do not feel comfortable increasing the dose at this time.   #  CAD s/p PCI: # NSVT: [name] had chest pain this admission that was thought to be due to her heart failure exacerbation. Cardiac enzymes have  been negative.  She had a 10 beat run of NSVT in the hospital that was asymptomatic.  Consider outpatient stress vs. Cath.  For now, continue metoprolol, aspirin, Imdur, and atorvastatin.  # Acute on CKD III:  Creatinine has improved from 2.1-1.8. Diuretics have been held since 8/4.  Diuretics as above.   # Morbid obesity: BMI 56   Signed, Chilton Si, MD  01/30/2017, 8:41 AM

## 2017-01-30 NOTE — Progress Notes (Signed)
SATURATION QUALIFICATIONS: (This note is used to comply with regulatory documentation for home oxygen)  Patient Saturations on Room Air at Rest = 85%  Patient Saturations on Room Air while Ambulating = n/a Pt unable to ambulate, pt wheelchair bound, pt has bilateral BKA  Patient Saturations on 2 Liters of oxygen while sitting= 96%

## 2017-01-30 NOTE — Discharge Summary (Signed)
Physician Discharge Summary  Sue Martin VHQ:[id number] DOB: Mar 07, 1966 DOA: 01/22/2017  PCP: Sue Martin, Sue R, DO  Admit date: 01/22/2017 Discharge date: 01/30/2017  Time spent: > 35 minutes  Recommendations for Outpatient Follow-up:  1. Monitor Serum creatinine 2. Will hold diuretics for 2 days, pt to start 02/01/17   Discharge Diagnoses:  Principal Problem:   Acute on chronic systolic CHF (congestive heart failure) (HCC) Active Problems:   Type 2 diabetes mellitus with diabetic nephropathy (HCC)   Cirrhosis of liver without ascites (HCC)   S/P bilateral BKA (below knee amputation) (HCC)   CAD (coronary artery disease)   CKD (chronic kidney disease) stage 3, GFR 30-59 ml/min   Hyperlipidemia   Chest pain   Acute on chronic respiratory failure with hypoxia (HCC)   Thrombocytopenia (HCC)   Discharge Condition: stable  Diet recommendation: Heart healthy  Filed Weights   01/28/17 0505 01/29/17 1039 01/30/17 0624  Weight: 110.3 kg (243 lb 1.6 oz) 110.5 kg (243 lb 11.2 oz) 109.8 kg (242 lb 1.6 oz)    History of present illness:  51 y.o. female with chronic systolic and diastolic heart failure, CAD status post PCI, cirrhosis, thrombocytopenia, diabetes, status post bilateral BKA, and retinal detachment here with acute on chronic systolic and diastolic heart failure.  Hospital Course:  Acute on chronic systolic and diastolic HF - Cardiology consulted who recommended the following: # Acute on chronic systolic and diastolic heart failure: Weight is down from 243 yesterday to 242 today.  She was net -600 mL off diuretics. Diuretics have been held since 8/4.  Her baseline creatinine is around 1.3.  I have instructed her to hold her diuretics until 8/8.  At that time she will resume her home metolazone and torsemide.  Follow up up with her nephrologist next Monday for BMP.  She will need to follow up with her cardiologist within two weeks.  Diuretics may need to be titrated.  However,  given her acute on chronic renal failure in the hospital, I do not feel comfortable increasing the dose at this time  NSVT - Cardiology consulted and currently recommending considering outpatient stress versus cath for now medical management with metoprolol, Imdur, aspirin, and atorvastatin  Acute on CKD III:   - Creatinine has improved from 2.1-1.8. Diuretics have been held since 8/4, we'll continue to hold till 8/8.    DM type II - We'll continue prior to admission medication regimen and recommend diabetic diet  Brother known medical conditions we'll continue prior to admission home medication regimen at discharge  Procedures:  None  Consultations:  Cardiology  Discharge Exam: Vitals:   01/29/17 2129 01/30/17 0624  BP: 118/63 108/74  Pulse: 73 71  Resp: 18   Temp: 98 F (36.7 C) 97.8 F (36.6 C)    General: Pt in nad, alert and awake Cardiovascular: rrr, no rubs Respiratory: no increase wob, no wheezes  Discharge Instructions   Discharge Instructions    Call MD for:  severe uncontrolled pain    Complete by:  As directed    Call MD for:  temperature >100.4    Complete by:  As directed    Diet - low sodium heart healthy    Complete by:  As directed    Discharge instructions    Complete by:  As directed    Please be sure to follow-up with your cardiologist for further evaluation recommendations. Please call their offices to confirm follow up appointment date and time.   Increase activity slowly  Complete by:  As directed      Current Discharge Medication List    START taking these medications   Details  metoprolol tartrate (LOPRESSOR) 25 MG tablet Take 0.5 tablets (12.5 mg total) by mouth 2 (two) times daily. Qty: 30 tablet, Refills: 0      CONTINUE these medications which have CHANGED   Details  isosorbide mononitrate (IMDUR) 30 MG 24 hr tablet Take 0.5 tablets (15 mg total) by mouth daily. Qty: 30 tablet, Refills: 0    metolazone (ZAROXOLYN) 5 MG  tablet Take 1 tablet (5 mg total) by mouth daily. Qty: 30 tablet, Refills: 11    torsemide (DEMADEX) 20 MG tablet Take 1 tablet (20 mg total) by mouth 2 (two) times daily. Qty: 60 tablet, Refills: 5      CONTINUE these medications which have NOT CHANGED   Details  aspirin 325 MG tablet Take 325 mg by mouth at bedtime.     atorvastatin (LIPITOR) 20 MG tablet Take 1 tablet (20 mg total) by mouth daily. Qty: 90 tablet, Refills: 1    cyclobenzaprine (FLEXERIL) 5 MG tablet Take 1 tablet (5 mg total) by mouth 2 (two) times daily as needed for muscle spasms. Qty: 30 tablet, Refills: 1    insulin aspart (NOVOLOG) 100 UNIT/ML FlexPen Inject 35 Units into the skin 3 (three) times daily with meals. Qty: 30 mL, Refills: 3    insulin degludec (TRESIBA FLEXTOUCH) 100 UNIT/ML SOPN FlexTouch Pen Inject 0.4 mLs (40 Units total) into the skin daily with breakfast. Qty: 5 pen, Refills: 1    potassium chloride 20 MEQ/15ML (10%) SOLN Take 30 mEq by mouth 2 (two) times daily.     hydrALAZINE (APRESOLINE) 10 MG tablet Take 1 tablet (10 mg total) by mouth 3 (three) times daily. Qty: 270 tablet, Refills: 3      STOP taking these medications     ibuprofen (ADVIL,MOTRIN) 200 MG tablet      lisinopril (PRINIVIL,ZESTRIL) 10 MG tablet      metoprolol succinate (TOPROL-XL) 25 MG 24 hr tablet      spironolactone (ALDACTONE) 25 MG tablet      benzonatate (TESSALON PERLES) 100 MG capsule        Allergies  Allergen Reactions  . Broccoli [Brassica Oleracea Italica] Anaphylaxis  . Influenza Vaccines Anaphylaxis    Per patient  . Pneumococcal Vaccines Anaphylaxis    Per patient  . Trulicity [Dulaglutide] Hives and Nausea And Vomiting  . Amoxicillin Nausea And Vomiting  . Doxycycline Hives and Nausea And Vomiting  . Iron Other (See Comments)    GI intolerance  . Latex Other (See Comments)    blisters  . Lisinopril Nausea Only  . Penicillins Other (See Comments)    GI intolerance, UTI, can  tolerate in IV Tolerates cephalosporins  . Tape Other (See Comments)    Tears skin.  Please use "paper" tape  . Codeine Nausea And Vomiting and Rash  . Sulfa Antibiotics Nausea And Vomiting and Rash  . Sulfur Nausea And Vomiting and Rash      The results of significant diagnostics from this hospitalization (including imaging, microbiology, ancillary and laboratory) are listed below for reference.    Significant Diagnostic Studies: Dg Chest 2 View  Result Date: 01/22/2017 CLINICAL DATA:  Patient with chest pain and shortness of breath for multiple days. EXAM: CHEST  2 VIEW COMPARISON:  Chest radiograph 09/13/2016. FINDINGS: Cardiomegaly. Bilateral interstitial pulmonary opacities. Trace bilateral pleural effusions. Thoracic spine degenerative changes. IMPRESSION: Cardiomegaly and  interstitial pulmonary edema. Electronically Signed   By: Annia Belt M.D.   On: 01/22/2017 18:45   Dg Chest Port 1 View  Result Date: 01/27/2017 CLINICAL DATA:  CHF. EXAM: PORTABLE CHEST 1 VIEW COMPARISON:  Chest x-ray dated 01/22/2017. FINDINGS: Stable cardiomegaly. Again noted is central pulmonary vascular congestion and bilateral interstitial edema, stable. Probable small right pleural effusion. IMPRESSION: Continued CHF, stable appearance compared to chest x-ray of 01/22/2017. Electronically Signed   By: Bary Richard M.D.   On: 01/27/2017 17:32    Microbiology: No results found for this or any previous visit (from the past 240 hour(s)).   Labs: Basic Metabolic Panel:  Recent Labs Lab 01/24/17 0329  01/26/17 0039 01/27/17 0412 01/28/17 0032 01/28/17 0330 01/29/17 0337 01/30/17 0346  NA 139  < > 139 140 137  --  139 138  K 3.8  < > 4.1 4.4 4.8  --  4.6 4.6  CL 95*  < > 94* 93* 96*  --  96* 97*  CO2 34*  < > 34* 34* 31  --  32 31  GLUCOSE 191*  < > 158* 136* 175*  --  138* 170*  BUN 30*  < > 38* 49* 50*  --  46* 47*  CREATININE 1.65*  < > 1.87* 1.99* 2.02*  --  2.10* 1.80*  CALCIUM 8.9  < > 9.1  9.5 9.4  --  9.6 9.7  MG 1.9  --   --   --   --  1.9  --  2.3  < > = values in this interval not displayed. Liver Function Tests:  Recent Labs Lab 01/24/17 0329 01/25/17 0243  AST 19 20  ALT 11* 10*  ALKPHOS 101 115  BILITOT 4.0* 3.8*  PROT 6.0* 6.7  ALBUMIN 3.1* 3.2*   No results for input(s): LIPASE, AMYLASE in the last 168 hours. No results for input(s): AMMONIA in the last 168 hours. CBC:  Recent Labs Lab 01/26/17 0039 01/26/17 0323 01/27/17 0412 01/28/17 0330 01/29/17 0042  WBC 5.4 5.8 4.6 4.9 5.4  HGB 14.6 14.4 14.5 13.8 13.8  HCT 46.0 45.4 45.1 43.7 43.8  MCV 95.4 95.6 96.4 96.0 95.8  PLT 85* 84* 91* 94* 87*   Cardiac Enzymes:  Recent Labs Lab 01/23/17 1531 01/23/17 1937  TROPONINI 0.04* <0.03   BNP: BNP (last 3 results)  Recent Labs  01/22/17 1723  BNP 474.8*    ProBNP (last 3 results) No results for input(s): PROBNP in the last 8760 hours.  CBG:  Recent Labs Lab 01/29/17 0735 01/29/17 1125 01/29/17 1650 01/29/17 2125 01/30/17 0742  GLUCAP 131* 152* 221* 216* 147*       Signed:  Penny Pia MD.  [facility] 01/30/2017, 11:51 AM

## 2017-01-30 NOTE — Progress Notes (Signed)
Called case management informing pt will need [facility] at discharge for transportation and home oxygen delivered Case manager Elnita Maxwellheryl aware

## 2017-01-30 NOTE — Progress Notes (Signed)
Case manager aware ambulation saturation qualifications completed yesterday. Pt unable to ambulate due to BKA.

## 2017-01-30 NOTE — Progress Notes (Signed)
Pt stated [facility] and stated no oxygen tank will be delivered in hospital. Mariea Clontsalled Cheryl to verify. Elnita MaxwellCheryl stated an oxygen tank will be delivered prior to discharge. Awaiting delivery. Elnita MaxwellCheryl stated [facility] will take pt oxygen in ambulance. Pt aware

## 2017-01-30 NOTE — Telephone Encounter (Signed)
Called patient and she stated that she is still in the hospital and she is currently waiting to be discharged. She stated they could not give her [place] and the Lantus they are giving her is not working well and she is going back on her [place] when she gets home.

## 2017-01-30 NOTE — Telephone Encounter (Signed)
Please advise 

## 2017-01-30 NOTE — Progress Notes (Signed)
Pt IV discontinued, catheter intact and telemetry removed. Pt discharge education provided at bedside. Pt has all belongings packed at bedside. Awaiting oxygen delivery prior to [facility] transportation

## 2017-01-30 NOTE — Progress Notes (Signed)
Elnita Maxwellheryl, case manager, aware pt oxygen has been delivered at bedside. Awaiting PTAR

## 2017-01-30 NOTE — Care Management Note (Signed)
Case Management Note  Patient Details  Name: Sue Martin MRN: [id number] Date of Birth: 1965-08-14  Subjective/Objective:      CM following for progression and d/c planning.               Action/Plan: 01/30/17 Notified by pt RN or need for home oxygen, await qualifying sats and orders for home oxygen.   Expected Discharge Date:                  Expected Discharge Plan:  Home/Self Care  In-House Referral:     Discharge planning Services  CM Consult  Post Acute Care Choice:  Durable Medical Equipment Choice offered to:  Patient  DME Arranged:  Oxygen DME Agency:  [facility]  HH Arranged:    HH Agency:     Status of Service:  In process, will continue to follow  If discussed at Long Length of Stay Meetings, dates discussed:    Additional Comments:  Starlyn SkeansRoyal, Andri Prestia U, RN 01/30/2017, 10:56 AM

## 2017-01-30 NOTE — Progress Notes (Signed)
Patient maintained O2 sats on 2L. No reports of SOB or respiratory distress. Patient was able to tolerate laying flat to sleep. No complaints of pain.

## 2017-01-30 NOTE — Progress Notes (Signed)
Pt refused insulin, stated she will take coverage at home when she eats dinner. PTAR at bedside, report given. Pt discharged via transporters, pt has all belongings.

## 2017-01-30 NOTE — Care Management Note (Addendum)
Case Management Note  Patient Details  Name: Sue Martin MRN: [id number] Date of Birth: 08-30-1965  Subjective/Objective:         following for progression and d/c planning.              Action/Plan: 01/30/17 Met with pt to discuss d/c needs. [name] with oxygen. Pt has no other DME needs. This CM explored a possible wider tub bench however this is not available locally. Pt will search online for this. [name] via PTAR with oxygen.   Expected Discharge Date:  01/30/17               Expected Discharge Plan:  Home/Self Care  In-House Referral:     Discharge planning Services  CM Consult  Post Acute Care Choice:  Durable Medical Equipment Choice offered to:  Patient  DME Arranged:  Oxygen DME Agency:  [place]:    [facility] Agency:     Status of Service:  In process, will continue to follow  If discussed at Long Length of Stay Meetings, dates discussed:    Additional Comments:  Adron Bene, RN 01/30/2017, 12:48 PM

## 2017-01-30 NOTE — Progress Notes (Signed)
Case management at bedside stated no equipment delivery, pt requested wider seat for kamode. Case manager talking to pt now

## 2017-01-30 NOTE — Telephone Encounter (Signed)
She will need to talk to her hospital Dr. to adjust her insulin.  She can also ask the nurse for a hospital diabetes educator to be seeing her to help with the control

## 2017-01-30 NOTE — Progress Notes (Signed)
Pt requesting all four rails up due to legally blind and BKA, pt uses them to reposition

## 2017-02-07 ENCOUNTER — Ambulatory Visit: Payer: Medicare HMO | Admitting: Endocrinology

## 2017-02-07 ENCOUNTER — Ambulatory Visit: Payer: Medicare HMO | Admitting: Gastroenterology

## 2017-02-09 ENCOUNTER — Telehealth: Payer: Self-pay | Admitting: *Deleted

## 2017-02-09 ENCOUNTER — Other Ambulatory Visit (INDEPENDENT_AMBULATORY_CARE_PROVIDER_SITE_OTHER): Payer: Medicare HMO

## 2017-02-09 ENCOUNTER — Ambulatory Visit (INDEPENDENT_AMBULATORY_CARE_PROVIDER_SITE_OTHER): Payer: Medicare HMO | Admitting: Physician Assistant

## 2017-02-09 ENCOUNTER — Encounter: Payer: Self-pay | Admitting: Physician Assistant

## 2017-02-09 VITALS — BP 120/68 | HR 72

## 2017-02-09 DIAGNOSIS — K7581 Nonalcoholic steatohepatitis (NASH): Secondary | ICD-10-CM

## 2017-02-09 DIAGNOSIS — K7460 Unspecified cirrhosis of liver: Secondary | ICD-10-CM

## 2017-02-09 LAB — COMPREHENSIVE METABOLIC PANEL
ALBUMIN: 3.5 g/dL (ref 3.5–5.2)
ALT: 7 U/L (ref 0–35)
AST: 16 U/L (ref 0–37)
Alkaline Phosphatase: 114 U/L (ref 39–117)
BILIRUBIN TOTAL: 4.4 mg/dL — AB (ref 0.2–1.2)
BUN: 34 mg/dL — ABNORMAL HIGH (ref 6–23)
CALCIUM: 9.6 mg/dL (ref 8.4–10.5)
CO2: 46 meq/L — AB (ref 19–32)
Chloride: 85 mEq/L — ABNORMAL LOW (ref 96–112)
Creatinine, Ser: 1.2 mg/dL (ref 0.40–1.20)
GFR: 50.53 mL/min — AB (ref >60.00)
Glucose, Bld: 55 mg/dL — ABNORMAL LOW (ref 70–99)
Potassium: 2.5 mEq/L — CL (ref 3.5–5.1)
Sodium: 138 mEq/L (ref 135–145)
TOTAL PROTEIN: 6.5 g/dL (ref 6.0–8.3)

## 2017-02-09 LAB — PROTIME-INR
INR: 1.4 ratio — AB (ref 0.8–1.0)
PROTHROMBIN TIME: 15.4 s — AB (ref 9.6–13.1)

## 2017-02-09 NOTE — Progress Notes (Signed)
I agree with the above note, plan 

## 2017-02-09 NOTE — Telephone Encounter (Signed)
Sue Martin called from [facility] with Amy Esterwood with a call report regarding a critical potassium level from a BMP is 2.5.  Dr Selena BattenKim was informed of this and I advised Sue Martin per Dr Selena BattenKim to contact the pts nephrologist with these results as they are treating this.  Waynetta SandyBeth was given the phone number for [facility]

## 2017-02-09 NOTE — Progress Notes (Signed)
Subjective:    Patient ID: Sue Martin, female    DOB: 1966-02-14, 51 y.o.   MRN: [id number]  HPI Chasitie is a 51 year old white female, recently established with Dr. Christella Hartigan who was last seen in our office in January 2018 for initial evaluation of cirrhosis. She comes in today for follow-up. Patient has multiple serious medical issues and was just discharged from the hospital on 01/30/2013 after admission for acute congestive heart failure and acute kidney injury. She has EF of about 35%, history of coronary artery disease, peripheral vascular disease, ischemic cardiomyopathy, adult-onset diabetes mellitus and chronic kidney disease stage III. She is status post bilateral BKA's. Her diuretics had to be held on discharge from the hospital and she has just restarted this morning. She is on Demadex 20 mg twice daily and Zaroxolyn 5 mg once daily. She has no current complaints directly referrable to cirrhosis. She is continuing to retain fluid and says she continues to feel mildly short of breath despite being started on chronic oxygen with this admission. She's currently on 2 L and says that if she takes it off her sats drop into the 80s within a couple of minutes. She has no complaints of abdominal pain. Her friend endorses feeding her a low sodium diet. EGD per Dr. Christella Hartigan on 07/07/2016 showed no evidence of esophageal varices.  Last upper abdominal ultrasound November 2017 with a last August the did show cholelithiasis and a nodular liver consistent with cirrhosis with fibrosis score F3 Plus F4. Hepatitis serologies were negative, autoimmune markers revealed a weakly positive A NA and slightly elevated anti-smooth muscle antibody, not felt clinically significant.Marland Kitchen MELD  was 12, 06/2016  Review of Systems Pertinent positive and negative review of systems were noted in the above HPI section.  All other review of systems was otherwise negative.  Outpatient Encounter Prescriptions as of 02/09/2017    Medication Sig  . aspirin 325 MG tablet Take 325 mg by mouth at bedtime.   Marland Kitchen atorvastatin (LIPITOR) 20 MG tablet Take 1 tablet (20 mg total) by mouth daily. (Patient taking differently: Take 20 mg by mouth every evening. )  . cyclobenzaprine (FLEXERIL) 5 MG tablet Take 1 tablet (5 mg total) by mouth 2 (two) times daily as needed for muscle spasms.  . hydrALAZINE (APRESOLINE) 10 MG tablet Take 10 mg by mouth 2 (two) times daily.  . insulin aspart (NOVOLOG) 100 UNIT/ML FlexPen Inject 35 Units into the skin 3 (three) times daily with meals.  . insulin degludec (TRESIBA FLEXTOUCH) 100 UNIT/ML SOPN FlexTouch Pen Inject 0.4 mLs (40 Units total) into the skin daily with breakfast. (Patient taking differently: Inject 30 Units into the skin daily with breakfast. )  . isosorbide mononitrate (IMDUR) 30 MG 24 hr tablet Take 0.5 tablets (15 mg total) by mouth daily.  . metolazone (ZAROXOLYN) 5 MG tablet Take 1 tablet (5 mg total) by mouth daily.  . metoprolol succinate (TOPROL-XL) 25 MG 24 hr tablet Take 1 tablet by mouth daily.  . metoprolol tartrate (LOPRESSOR) 25 MG tablet Take 0.5 tablets (12.5 mg total) by mouth 2 (two) times daily.  . OXYGEN Inhale into the lungs.  . potassium chloride 20 MEQ/15ML (10%) SOLN Take 30 mEq by mouth 2 (two) times daily.   Marland Kitchen torsemide (DEMADEX) 20 MG tablet Take 1 tablet (20 mg total) by mouth 2 (two) times daily.  . [DISCONTINUED] hydrALAZINE (APRESOLINE) 10 MG tablet Take 1 tablet (10 mg total) by mouth 3 (three) times daily.   No facility-administered  encounter medications on file as of 02/09/2017.    Allergies  Allergen Reactions  . Broccoli [Brassica Oleracea Italica] Anaphylaxis  . Influenza Vaccines Anaphylaxis    Per patient  . Pneumococcal Vaccines Anaphylaxis    Per patient  . Trulicity [Dulaglutide] Hives and Nausea And Vomiting  . Amoxicillin Nausea And Vomiting  . Doxycycline Hives and Nausea And Vomiting  . Iron Other (See Comments)    GI intolerance   . Latex Other (See Comments)    blisters  . Lisinopril Nausea Only  . Penicillins Other (See Comments)    GI intolerance, UTI, can tolerate in IV Tolerates cephalosporins  . Tape Other (See Comments)    Tears skin.  Please use "paper" tape  . Codeine Nausea And Vomiting and Rash  . Sulfa Antibiotics Nausea And Vomiting and Rash  . Sulfur Nausea And Vomiting and Rash   Patient Active Problem List   Diagnosis Date Noted  . Chest pain 01/22/2017  . Acute on chronic respiratory failure with hypoxia (HCC) 01/22/2017  . Thrombocytopenia (HCC) 01/22/2017  . Cardiomyopathy, ischemic 06/13/2016  . Type II diabetes mellitus with ophthalmic manifestations, uncontrolled (HCC) 12/22/2014  . Hyperlipidemia 12/22/2014  . CAD (coronary artery disease) 12/12/2014  . Diabetic retinopathy (HCC) 12/12/2014  . PVD (peripheral vascular disease) (HCC) 12/12/2014  . CKD (chronic kidney disease) stage 3, GFR 30-59 ml/min 12/12/2014  . Type 2 diabetes mellitus with diabetic nephropathy (HCC) 11/28/2014  . Acute on chronic systolic CHF (congestive heart failure) (HCC) 11/28/2014  . Cirrhosis of liver without ascites (HCC) 11/28/2014  . Generalized OA 11/28/2014  . S/P bilateral BKA (below knee amputation) (HCC) 11/28/2014   Social History   Social History  . Marital status: Married    Spouse name: N/A  . Number of children: N/A  . Years of education: N/A   Occupational History  . Disabled    Social History Main Topics  . Smoking status: Never Smoker  . Smokeless tobacco: Never Used  . Alcohol use No  . Drug use: No  . Sexual activity: Not on file   Other Topics Concern  . Not on file   Social History Narrative   Married   Husband is a Naval architect, he is home 3 days/month   Son and BFF live there and help her.       Ms. Leis family history includes Arthritis in her father, maternal grandmother, and mother; CVA in her father; Diabetes in her maternal grandmother and mother; Heart  disease in her mother; Hypertension in her father; Mental illness in her mother; Sudden death in her paternal grandfather.      Objective:    Vitals:   02/09/17 1049  BP: 120/68  Pulse: 72  SpO2: 93%    Physical Exam  well-developed chronically ill-appearing white female in a wheelchair status post bilateral BKA's, on nasal oxygen   Accompanied by her friend HEENT ;nontraumatic normocephalic EOMI PERRLA sclera anicteric, Cardiovascular ;regular rate and rhythm soft systolic murmur, Pulmonary; clear bilaterally, Abdomen ;obese, bowel sounds are present she has 2+ edema in the soft tissues of the lower abdomen with tenderness no palpable mass or hepatosplenomegaly, Extremities ;she status post bilateral BKA's he has trace edema lower thighs Neuropsych ;mood and affect appropriate       Assessment & Plan:   #9 51 year old female with multiple serious comorbidities, diagnosed with compensated cirrhosis, most consistent with NASH induced. No varices noted on EGD January 2018, she has not had ascites. #2 congestive heart  failure with EF 35%-recent admission with CHF within the past 3 weeks, now requiring oxygen #3 ischemic cardiomyopathy #4 coronary artery disease #5 peripheral vascular disease #6 adult-onset diabetes mellitus #7 status post bilateral BKA's   #8 chronic kidney disease stage III  Plan; We rediscussed 2 g sodium diet and importance of adherence Avoidance of alcohol Diuretic management will be per cardiology and nephrology CMET, ProTime/INR and AFP today She will need follow-up upper abdominal ultrasound in November She will follow up with Dr. Christella HartiganJacobs, appointment made today for early November.;   Amy Oswald HillockS Esterwood PA-C 02/09/2017   Cc: Terressa KoyanagiKim, [name], DO

## 2017-02-09 NOTE — Progress Notes (Signed)
HPI:  Sue Martin is a pleasant 51 with a very complicated PMH here for hospital follow up. See phone note for transitional care management.  Hospitalized 7/29-01/30/17 for CHF Course complicated by Acute on chronic renal failure She see cardiology and nephrology for management of complicated fluid balance situation with combined CHF, CKD, hx repetitively low potassium and reported intolerance to most potassium supplementation. She sees endocrinologist for her diabetes, multiple complications including s/p bilat BKAs, kidney disease and retinopathy. She saw GI yesterday for follow up Cirrhosis and had labs then. Potassium was quite low. Reports nobody told her this. Reports was on pot sup bid prior to hospitalization and now only on 40 once daily. Wants to bump back up to 30 bid. Insist on rechecking with her nephrologist. Reports feels ok. Mild SOB unchanged. No CP, palpitation, increased swelling - she feels like fluid volume in down. Refuses weights. She reports her spouse "demands" a neurology referral for memory concerns that are chronic. She is worried about dementia.  Due for a number of preventive measures - have advised many times.   ROS: See pertinent positives and negatives per HPI.  Past Medical History:  Diagnosis Date  . CAD (coronary artery disease) 12/12/2014   -s/p 2V PCI of the LAD/RCA with taxus stents -cardiologist is Dr. Carmon Sails, [facility]   . CHF (congestive heart failure) (HCC)    sees Dr. Wynonia Hazard  . Chronic kidney disease   . Depression   . Diabetes mellitus with renal complications (HCC)    PVD and retinopathy, S/p bilat ambutations  . Glaucoma   . Liver cirrhosis (HCC)   . Lyme disease   . Retinal detachment    sees Dr. Johna Sheriff per her report  . 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 ESOPHAGOGASTRODUODENOSCOPY (EGD) WITH PROPOFOL N/A 07/07/2016   Procedure:  ESOPHAGOGASTRODUODENOSCOPY (EGD) WITH PROPOFOL;  Surgeon: Rachael Fee, MD;  Location: [facility];  Service: Endoscopy;  Laterality: N/A;  . LASIK    . LEG AMPUTATION BELOW KNEE  09/09/2011, 09/11/2011  . TUBAL LIGATION      Family History  Problem Relation Age of Onset  . Arthritis Mother   . Heart disease Mother   . Mental illness Mother   . Diabetes Mother   . Arthritis Maternal Grandmother   . Diabetes Maternal Grandmother   . Arthritis Father   . CVA Father   . Hypertension Father   . Sudden death Paternal Grandfather     Social History   Social History  . Marital status: Married    Spouse name: N/A  . Number of children: N/A  . Years of education: N/A   Occupational History  . Disabled    Social History Main Topics  . Smoking status: Never Smoker  . Smokeless tobacco: Never Used  . Alcohol use No  . Drug use: No  . Sexual activity: Not Asked   Other Topics Concern  . None   Social History Narrative   Married   Husband is a Naval architect, he is home 3 days/month   Son and BFF live there and help her.        Current Outpatient Prescriptions:  .  aspirin 325 MG tablet, Take 325 mg by mouth at bedtime. , Disp: , Rfl:  .  atorvastatin (LIPITOR) 20 MG tablet, Take 1 tablet (20 mg total) by mouth daily. (Patient taking differently: Take 20 mg by mouth  every evening. ), Disp: 90 tablet, Rfl: 1 .  cyclobenzaprine (FLEXERIL) 5 MG tablet, Take 1 tablet (5 mg total) by mouth 2 (two) times daily as needed for muscle spasms., Disp: 30 tablet, Rfl: 1 .  hydrALAZINE (APRESOLINE) 10 MG tablet, Take 10 mg by mouth 2 (two) times daily., Disp: , Rfl:  .  insulin aspart (NOVOLOG) 100 UNIT/ML FlexPen, Inject 35 Units into the skin 3 (three) times daily with meals., Disp: 30 mL, Rfl: 3 .  insulin degludec (TRESIBA FLEXTOUCH) 100 UNIT/ML SOPN FlexTouch Pen, Inject 0.4 mLs (40 Units total) into the skin daily with breakfast. (Patient taking differently: Inject 30 Units into the  skin daily with breakfast. ), Disp: 5 pen, Rfl: 1 .  isosorbide mononitrate (IMDUR) 30 MG 24 hr tablet, Take 0.5 tablets (15 mg total) by mouth daily., Disp: 30 tablet, Rfl: 0 .  metolazone (ZAROXOLYN) 5 MG tablet, Take 1 tablet (5 mg total) by mouth daily., Disp: 30 tablet, Rfl: 11 .  metoprolol succinate (TOPROL-XL) 25 MG 24 hr tablet, Take 1 tablet by mouth daily., Disp: , Rfl:  .  metoprolol tartrate (LOPRESSOR) 25 MG tablet, Take 0.5 tablets (12.5 mg total) by mouth 2 (two) times daily., Disp: 30 tablet, Rfl: 0 .  OXYGEN, Inhale into the lungs., Disp: , Rfl:  .  potassium chloride 20 MEQ/15ML (10%) SOLN, Take 30 mEq by mouth 2 (two) times daily. , Disp: , Rfl:  .  torsemide (DEMADEX) 20 MG tablet, Take 1 tablet (20 mg total) by mouth 2 (two) times daily., Disp: 60 tablet, Rfl: 5  EXAM:  Vitals:   02/10/17 1040  BP: 92/72  Pulse: 79  Temp: 98.5 F (36.9 C)  SpO2: 92%    There is no height or weight on file to calculate BMI.  GENERAL: vitals reviewed and listed above, alert, oriented, appears well hydrated and in no acute distress  HEENT: atraumatic, conjunttiva clear, no obvious abnormalities on inspection of external nose and ears  NECK: no obvious masses on inspection  LUNGS: clear to auscultation bilaterally, no wheezes, rales or rhonchi, good air movement  CV: HRRR, no peripheral edema  MS: moves all extremities without noticeable abnormality  PSYCH: pleasant and cooperative, no obvious depression or anxiety  ASSESSMENT AND PLAN:  Discussed the following assessment and plan:  Acute on chronic systolic CHF (congestive heart failure) (HCC)  CKD (chronic kidney disease) stage 3, GFR 30-59 ml/min  Hypokalemia  Memory difficulties - Plan: Ambulatory referral to Neurology  -Seems comfortable on exam, O2 sats 94% on my check -she was not aware of potassium results drawn yesterday with GI - seems is on decreased dose of her potassium from the hospital stay; she  prefers her fluid pills/potassium/ heat meds be managed by her kidney and heart doctors, but she has not been able to get appointment with them. We opted to bump her pot back up to what she reports her kidney doctor had told her to take. I advised a recheck soon, but sh einsists she wants to recheck at her kidney office. Advised assistant and pt to contact her nephrologist about change and need for recheck in 1 week. -she has follow up with her nephrologist and cardiologist and is on wait list she reports for sooner follow up -advised to call if any concerns -referral per her request for memory issues -Patient advised to return or notify a doctor immediately if symptoms worsen or persist or new concerns arise.  Patient Instructions  BEFORE YOU LEAVE: -  follow up: cancel any regular follow up.  -follow up in 3 months  Please contact your nephrologist for follow up on the potassium. I am ok with you increasing the potassium to the dose you were on prior to hospitalization given the low potassium yesterday. Please ensure you recheck your potassium in 1 week. Please let us know if you wish to recheck it here.  Please contact your cardiologist if any chest pain, trouble breathing or any new concerns about your heart.  -We placed a referral for you as discussed to the neurologist about your memory concerns. It usually takes about 1-2 weeks to process and schedule this referral. If you have not heard from us regarding this appointment in 2 weeks please contact our office.    Kriste BasqueKIM, Ineze Serrao R., DO

## 2017-02-09 NOTE — Patient Instructions (Signed)
Please go to the basement level to have your labs drawn.  We made you an appointment with Dr. Christella HartiganJacobs on 04-28-2017 at 2:15 PM.   Continue 2 GM sodium diet.

## 2017-02-10 ENCOUNTER — Ambulatory Visit (INDEPENDENT_AMBULATORY_CARE_PROVIDER_SITE_OTHER): Payer: Medicare HMO | Admitting: Family Medicine

## 2017-02-10 ENCOUNTER — Telehealth: Payer: Self-pay | Admitting: *Deleted

## 2017-02-10 ENCOUNTER — Encounter: Payer: Self-pay | Admitting: Family Medicine

## 2017-02-10 VITALS — BP 92/72 | HR 79 | Temp 98.5°F

## 2017-02-10 DIAGNOSIS — R413 Other amnesia: Secondary | ICD-10-CM | POA: Diagnosis not present

## 2017-02-10 DIAGNOSIS — N183 Chronic kidney disease, stage 3 unspecified: Secondary | ICD-10-CM

## 2017-02-10 DIAGNOSIS — I5023 Acute on chronic systolic (congestive) heart failure: Secondary | ICD-10-CM | POA: Diagnosis not present

## 2017-02-10 DIAGNOSIS — E876 Hypokalemia: Secondary | ICD-10-CM

## 2017-02-10 LAB — AFP TUMOR MARKER: AFP-Tumor Marker: 1.2 ng/mL (ref <6.1)

## 2017-02-10 NOTE — Patient Instructions (Addendum)
BEFORE YOU LEAVE: -follow up: cancel any regular follow up.  -follow up in 3 months  Please contact your nephrologist for follow up on the potassium. I am ok with you increasing the potassium to the dose you were on prior to hospitalization given the low potassium yesterday. Please ensure you recheck your potassium in 1 week. Please let us know if you wish to recheck it here.  Please contact your cardiologist if any chest pain, trouble breathing or any new concerns about your heart.  -We placed a referral for you as discussed to the neurologist about your memory concerns. It usually takes about 1-2 weeks to process and schedule this referral. If you have not heard from [place] regarding this appointment in 2 weeks please contact our office.

## 2017-02-10 NOTE — Telephone Encounter (Signed)
Dr Selena Batten received a fax from [facility] for parts needed for wheelchair repair.  I left a detailed message at [phone] opt. 2 for Sue Martin stating per Dr Selena Batten she did not order this and to try contacting the cardiologist since they are managing this.

## 2017-02-10 NOTE — Telephone Encounter (Signed)
Per Dr Selena Batten, pt had a BMP with the GI office with results of 2.5 and she is increasing the potassium to twice a day. The pt requests to have a BMP done next week (follow up visit on 9/10) at Dr Lily Lovings office instead of having this done at our lab today.  I called [facility] and spoke with Ebony.  Ebony stated Dr Marisue Humble is aware of the pts history, in which this is her usual result, patient will report she does not take her medication daily due to the way she feels, and will often only take this a few days prior to the visit at their office.  She stated she will call the pt to schedule a lab visit.  Message sent to Dr Selena Batten as Lorain Childes.

## 2017-02-13 ENCOUNTER — Ambulatory Visit (INDEPENDENT_AMBULATORY_CARE_PROVIDER_SITE_OTHER): Payer: Medicare HMO | Admitting: Endocrinology

## 2017-02-13 ENCOUNTER — Encounter: Payer: Self-pay | Admitting: Endocrinology

## 2017-02-13 VITALS — BP 120/76 | HR 77

## 2017-02-13 DIAGNOSIS — Z794 Long term (current) use of insulin: Secondary | ICD-10-CM | POA: Diagnosis not present

## 2017-02-13 DIAGNOSIS — E1165 Type 2 diabetes mellitus with hyperglycemia: Secondary | ICD-10-CM | POA: Diagnosis not present

## 2017-02-13 LAB — GLUCOSE, POCT (MANUAL RESULT ENTRY): POC Glucose: 56 mg/dl — AB (ref 70–99)

## 2017-02-13 MED ORDER — ACCU-CHEK AVIVA PLUS W/DEVICE KIT: PACK | 0 refills | Status: DC

## 2017-02-13 NOTE — Progress Notes (Signed)
Patient ID: Sue Martin, female   DOB: 04-02-1966, 51 y.o.   MRN: [id number]           Reason for Appointment: Follow-up for Type 2 Diabetes   History of Present Illness:          Date of diagnosis of type 2 diabetes mellitus: ?  2007        Recent history:   INSULIN regimen is described as:  Evaristo Bury 30 units daily in the morning, Novolog 35 units with meals       A1c in 11/17 was 10.1 and it is now staying around 8+ %  She is still noncompliant with her follow-up as directed and has not been seen since 5/18  Current blood sugar patterns and problems identified:  Apparently her blood sugars are much better with using [place] compared to Lantus and she is needing a much lower dose  However since she lost her blood sugar monitor last month during her hospitalization we do not know what her blood sugars are  Glucose is in the 50s this morning before breakfast and also after breakfast when she had labs last Thursday  She thinks her blood sugars are usually not over her 190 after meals but not clear how often she is checking them  Her diet is somewhat variable but she is trying to watch her sweets and high-fat foods more recently  She was tried on metformin on her last visit but this has been stopped, has difficulty tolerating relatively higher doses anyway  Side effects with medications: vomiting with Trulicity  Compliance with the medical regimen: Fair  Hypoglycemia: None  Glucose monitoring:  done occasionally        Glucometer: Accu-Chek .       Blood Glucose readings by recall:   Previously getting blood sugars in the 85-120 range after waking up  Self-care:  Meal times: Breakfast: 10 am Lunch: 3-4 pm,    Dinner at 8 pm  Typical meal intake: Breakfast is usually a granola bar or light yogurt, lunch may be sandwich or just light snack and soups, dinner is usually yogurt or other snacks like fruit or sugar-free cookies                Dietician visit, most recent:  none, only in the hospital               Exercise:  unable to do any  Weight history: Around the time of diagnosis her weight was 350  Wt Readings from Last 3 Encounters:  01/30/17 242 lb 1.6 oz (109.8 kg)  07/07/16 250 lb (113.4 kg)  11/17/15 226 lb 15.8 oz (103 kg)    Glycemic control:   Lab Results  Component Value Date   HGBA1C 8.4 (H) 01/23/2017   HGBA1C 8.7 (H) 09/12/2016   HGBA1C 10.1 05/02/2016   Lab Results  Component Value Date   MICROALBUR 4.8 (H) 10/28/2016   LDLCALC 92 01/23/2017   CREATININE 1.20 02/09/2017    Other active problems: See review of systems   Allergies as of 02/13/2017      Reactions   Broccoli [brassica Oleracea Italica] Anaphylaxis   Influenza Vaccines Anaphylaxis   Per patient   Pneumococcal Vaccines Anaphylaxis   Per patient   Trulicity [dulaglutide] Hives, Nausea And Vomiting   Amoxicillin Nausea And Vomiting   Doxycycline Hives, Nausea And Vomiting   Iron Other (See Comments)   GI intolerance   Latex Other (See Comments)   blisters  Lisinopril Nausea Only   Penicillins Other (See Comments)   GI intolerance, UTI, can tolerate in IV Tolerates cephalosporins   Tape Other (See Comments)   Tears skin.  Please use "paper" tape   Codeine Nausea And Vomiting, Rash   Sulfa Antibiotics Nausea And Vomiting, Rash   Sulfur Nausea And Vomiting, Rash      Medication List       Accurate as of 02/13/17  8:59 AM. Always use your most recent med list.          aspirin 325 MG tablet Take 325 mg by mouth at bedtime.   atorvastatin 20 MG tablet Commonly known as:  LIPITOR Take 1 tablet (20 mg total) by mouth daily.   cyclobenzaprine 5 MG tablet Commonly known as:  FLEXERIL Take 1 tablet (5 mg total) by mouth 2 (two) times daily as needed for muscle spasms.   hydrALAZINE 10 MG tablet Commonly known as:  APRESOLINE Take 10 mg by mouth 2 (two) times daily.   insulin aspart 100 UNIT/ML FlexPen Commonly known as:  NOVOLOG Inject 35  Units into the skin 3 (three) times daily with meals.   insulin degludec 100 UNIT/ML Sopn FlexTouch Pen Commonly known as:  TRESIBA FLEXTOUCH Inject 0.4 mLs (40 Units total) into the skin daily with breakfast.   isosorbide mononitrate 30 MG 24 hr tablet Commonly known as:  IMDUR Take 0.5 tablets (15 mg total) by mouth daily.   metolazone 5 MG tablet Commonly known as:  ZAROXOLYN Take 1 tablet (5 mg total) by mouth daily.   metoprolol succinate 25 MG 24 hr tablet Commonly known as:  TOPROL-XL Take 1 tablet by mouth daily.   metoprolol tartrate 25 MG tablet Commonly known as:  LOPRESSOR Take 0.5 tablets (12.5 mg total) by mouth 2 (two) times daily.   OXYGEN Inhale into the lungs.   potassium chloride 20 MEQ/15ML (10%) Soln Take 30 mEq by mouth 2 (two) times daily.   torsemide 20 MG tablet Commonly known as:  DEMADEX Take 1 tablet (20 mg total) by mouth 2 (two) times daily.       Allergies:  Allergies  Allergen Reactions  . Broccoli [Brassica Oleracea Italica] Anaphylaxis  . Influenza Vaccines Anaphylaxis    Per patient  . Pneumococcal Vaccines Anaphylaxis    Per patient  . Trulicity [Dulaglutide] Hives and Nausea And Vomiting  . Amoxicillin Nausea And Vomiting  . Doxycycline Hives and Nausea And Vomiting  . Iron Other (See Comments)    GI intolerance  . Latex Other (See Comments)    blisters  . Lisinopril Nausea Only  . Penicillins Other (See Comments)    GI intolerance, UTI, can tolerate in IV Tolerates cephalosporins  . Tape Other (See Comments)    Tears skin.  Please use "paper" tape  . Codeine Nausea And Vomiting and Rash  . Sulfa Antibiotics Nausea And Vomiting and Rash  . Sulfur Nausea And Vomiting and Rash    Past Medical History:  Diagnosis Date  . CAD (coronary artery disease) 12/12/2014   -s/p 2V PCI of the LAD/RCA with taxus stents -cardiologist is Dr. Carmon Sails, [place]   . CHF (congestive heart failure) (HCC)    sees Dr. Wynonia Hazard  . Chronic  kidney disease   . Depression   . Diabetes mellitus with renal complications (HCC)    PVD and retinopathy, S/p bilat ambutations  . Glaucoma   . Liver cirrhosis (HCC)   . Lyme disease   . Retinal detachment  sees Dr. Johna Sheriff per her report  . 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 ESOPHAGOGASTRODUODENOSCOPY (EGD) WITH PROPOFOL N/A 07/07/2016   Procedure: ESOPHAGOGASTRODUODENOSCOPY (EGD) WITH PROPOFOL;  Surgeon: Rachael Fee, MD;  Location: [facility];  Service: Endoscopy;  Laterality: N/A;  . LASIK    . LEG AMPUTATION BELOW KNEE  09/09/2011, 09/11/2011  . TUBAL LIGATION      Family History  Problem Relation Age of Onset  . Arthritis Mother   . Heart disease Mother   . Mental illness Mother   . Diabetes Mother   . Arthritis Maternal Grandmother   . Diabetes Maternal Grandmother   . Arthritis Father   . CVA Father   . Hypertension Father   . Sudden death Paternal Grandfather     Social History:  reports that she has never smoked. She has never used smokeless tobacco. She reports that she does not drink alcohol or use drugs.    Review of Systems    Lipid history: On Lipitor for the last few years, taking 20 mg, Not clear if this is being followed by PCP or cardiologist   Lab Results  Component Value Date   CHOL 135 01/23/2017   HDL 26 (L) 01/23/2017   LDLCALC 92 01/23/2017   TRIG 87 01/23/2017   CHOLHDL 5.2 01/23/2017           Eyes:  history of significant Visual loss  since about 2005.    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 She has bilateral below-knee amputations    Physical Examination:  BP 120/76   Pulse 77   SpO2 94%     ASSESSMENT:  Diabetes type 2, uncontrolled with obesity See history of present illness for detailed  discussion of current diabetes management, blood sugar patterns and problems identified  She has persistently poor control of her diabetes with A1c recently 8.4 This is despite her reporting fairly good blood sugars at home when she was monitoring Also requiring much less basal insulin using [place] For some reason she is starting to get some hypoglycemia now even though she appears to be symptomatic today with the lab glucose of 58 When she is not monitoring her blood sugar currently will need to have her get back into testing   PLAN:   She was given a prescription for another Accu-Chek Aviva plus  She needs to check blood sugars at least twice a day  Discussed blood sugar targets and need to check both fasting and postprandial readings consistently  Cut back at least 4 units on her Evaristo Bury, to call if she is still having readings out of range in the morning  We'll need to review her monitor on the next visit and adjust her insulin accordingly  Patient Instructions  3 Tresiba daily  If am sugar stays under 90 or over 150   Check blood sugars on waking up  daily  Also check blood sugars about 2 hours after a meal and do this after different meals by rotation  Recommended blood sugar levels on waking up is 90-130 and about 2 hours after meal is 130-160  Please bring your blood sugar monitor to each visit, thank you              [facility] 02/13/2017, 8:59 AM   Note:  This office note was prepared with Insurance underwriter. Any transcriptional errors that result from this process are unintentional.  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  Message left on patient's voice mail

## 2017-02-13 NOTE — Patient Instructions (Addendum)
26 Tresiba daily, only 20 today  If am sugar stays under 90 or over 150   Check blood sugars on waking up  daily  Also check blood sugars about 2 hours after a meal and do this after different meals by rotation  Recommended blood sugar levels on waking up is 90-130 and about 2 hours after meal is 130-160  Please bring your blood sugar monitor to each visit, thank you

## 2017-02-15 ENCOUNTER — Telehealth: Payer: Self-pay | Admitting: Endocrinology

## 2017-02-15 NOTE — Telephone Encounter (Signed)
Her insurance will not cover the freestyle Libre either unless she is documenting testing 4 times a day She needs to check with her insurance what brand they will cover, they're supposed to cover Accu-Chek Aviva plus

## 2017-02-15 NOTE — Telephone Encounter (Signed)
Please advise 

## 2017-02-15 NOTE — Telephone Encounter (Signed)
Patient needs Dr. Lucianne Muss to write a rx for her to have the Freestyle Tester that does not require the patient to take blood. The patient's insurance will not cover the one that takes blood. Call patient to further advise.

## 2017-02-16 NOTE — Addendum Note (Signed)
Addendum  created 02/16/17 0934 by Kipp Brood, MD   Sign clinical note

## 2017-02-16 NOTE — Telephone Encounter (Signed)
Called patient and let her know that she can contact her insurance company to see what is covered. She stated that it is hard to check her blood sugar in her fingers due to the nerve damage and it is very painful for her.

## 2017-03-06 DIAGNOSIS — E876 Hypokalemia: Secondary | ICD-10-CM | POA: Diagnosis not present

## 2017-03-06 DIAGNOSIS — I509 Heart failure, unspecified: Secondary | ICD-10-CM | POA: Diagnosis not present

## 2017-03-06 DIAGNOSIS — N183 Chronic kidney disease, stage 3 (moderate): Secondary | ICD-10-CM | POA: Diagnosis not present

## 2017-03-11 ENCOUNTER — Other Ambulatory Visit: Payer: Self-pay | Admitting: Endocrinology

## 2017-03-16 ENCOUNTER — Ambulatory Visit: Payer: Medicare HMO | Admitting: Family Medicine

## 2017-03-21 DIAGNOSIS — E876 Hypokalemia: Secondary | ICD-10-CM | POA: Diagnosis not present

## 2017-03-27 ENCOUNTER — Encounter: Payer: Self-pay | Admitting: Endocrinology

## 2017-03-27 ENCOUNTER — Ambulatory Visit (INDEPENDENT_AMBULATORY_CARE_PROVIDER_SITE_OTHER): Payer: Medicare HMO | Admitting: Endocrinology

## 2017-03-27 VITALS — BP 110/80 | HR 68

## 2017-03-27 DIAGNOSIS — Z794 Long term (current) use of insulin: Secondary | ICD-10-CM

## 2017-03-27 DIAGNOSIS — K3184 Gastroparesis: Secondary | ICD-10-CM | POA: Diagnosis not present

## 2017-03-27 DIAGNOSIS — E1165 Type 2 diabetes mellitus with hyperglycemia: Secondary | ICD-10-CM

## 2017-03-27 DIAGNOSIS — E1143 Type 2 diabetes mellitus with diabetic autonomic (poly)neuropathy: Secondary | ICD-10-CM | POA: Diagnosis not present

## 2017-03-27 MED ORDER — METOCLOPRAMIDE HCL 5 MG PO TABS: 5.0000 mg | ORAL_TABLET | Freq: Three times a day (TID) | ORAL | 3 refills | Status: DC

## 2017-03-27 NOTE — Patient Instructions (Signed)
Change Lantus to [place] and 40 units and base dose on am sugar patterns  Novolog range 25-35 based on type of meal u will eat  Try forearm for blood testing  Check blood sugars on waking up  3-4/7 days  Also check blood sugars about 2 hours after a meal and do this after different meals by rotation  Recommended blood sugar levels on waking up is 90-130 and about 2 hours after meal is 130-160  Please bring your blood sugar monitor to each visit, thank you

## 2017-03-27 NOTE — Progress Notes (Signed)
Patient ID: Sue Martin, female   DOB: Oct 19, 1965, 51 y.o.   MRN: [id number]           Reason for Appointment: Follow-up for Type 2 Diabetes   History of Present Illness:          Date of diagnosis of type 2 diabetes mellitus: ?  2007        Recent history:   INSULIN regimen is described as:  Lantus 50 units daily in the morning, Novolog 35 units with meals       A1c in 11/17 was 10.1 and it is now staying around 8+ %   Current blood sugar patterns and problems identified:  Although her blood sugars were better with [place] compared to Lantus and she was taking only 30 units she went back to Lantus on her own  She says that she was having vomiting spells and she thought it was related to [place] and did not notify us  She is checking her blood sugars at various times but not clear which readings are after meals  Her fasting blood sugars do not appear to be consistently high except once with eating pizza night before  Blood sugars later in the day are quite variable but occasionally relatively high readings in the early afternoon  She does not adjust her NOVOLOG based on what she is eating and is taking the same dose regardless with variable results  She said that she is having difficulty with eating sometimes and may not finish meals  Also she said that she has difficulty getting a blood sample from her fingers for testing and also this is painful  HYPOGLYCEMIA has been occurring only twice on her monitor but she previously has had a symptomatic low sugars also  Her diet is variable with sometimes not balanced meal  Side effects with medications: vomiting with Trulicity  Compliance with the medical regimen: Fair  Hypoglycemia: None  Glucose monitoring:  done occasionally        Glucometer: Accu-Chek .       Blood Glucose readings by: Download    Mean values apply above for all meters except median for One Touch  PRE-MEAL Fasting Lunch Dinner Bedtime Overall    Glucose range: 59-213   1 54-245  57-108  70- 98    Mean/median:     127+/-66      Self-care:  Meal times: Breakfast: 10 am Lunch: 3-4 pm,    Dinner at 8 pm  Typical meal intake: Breakfast is usually a granola bar or light yogurt, lunch may be sandwich or just light snack and soups, dinner is usually yogurt or other snacks like fruit or sugar-free cookies                Dietician visit, most recent: none, only in the hospital               Exercise:  unable to do any  Weight history: Around the time of diagnosis her weight was 350  Wt Readings from Last 3 Encounters:  01/30/17 242 lb 1.6 oz (109.8 kg)  07/07/16 250 lb (113.4 kg)  11/17/15 226 lb 15.8 oz (103 kg)    Glycemic control:   Lab Results  Component Value Date   HGBA1C 8.4 (H) 01/23/2017   HGBA1C 8.7 (H) 09/12/2016   HGBA1C 10.1 05/02/2016   Lab Results  Component Value Date   MICROALBUR 4.8 (H) 10/28/2016   LDLCALC 92 01/23/2017   CREATININE 1.20 02/09/2017  Other active problems: See review of systems   Allergies as of 03/27/2017      Reactions   Broccoli [brassica Oleracea Italica] Anaphylaxis   Influenza Vaccines Anaphylaxis   Per patient   Pneumococcal Vaccines Anaphylaxis   Per patient   Trulicity [dulaglutide] Hives, Nausea And Vomiting   Amoxicillin Nausea And Vomiting   Doxycycline Hives, Nausea And Vomiting   Iron Other (See Comments)   GI intolerance   Latex Other (See Comments)   blisters   Lisinopril Nausea Only   Penicillins Other (See Comments)   GI intolerance, UTI, can tolerate in IV Tolerates cephalosporins   Tape Other (See Comments)   Tears skin.  Please use "paper" tape   Codeine Nausea And Vomiting, Rash   Sulfa Antibiotics Nausea And Vomiting, Rash   Sulfur Nausea And Vomiting, Rash      Medication List       Accurate as of 03/27/17 11:24 AM. Always use your most recent med list.          ACCU-CHEK AVIVA PLUS w/Device Kit Use to check blood sugar 3 times a  day   aspirin 325 MG tablet Take 325 mg by mouth at bedtime.   atorvastatin 20 MG tablet Commonly known as:  LIPITOR Take 1 tablet (20 mg total) by mouth daily.   cyclobenzaprine 5 MG tablet Commonly known as:  FLEXERIL Take 1 tablet (5 mg total) by mouth 2 (two) times daily as needed for muscle spasms.   hydrALAZINE 10 MG tablet Commonly known as:  APRESOLINE Take 10 mg by mouth 2 (two) times daily.   insulin aspart 100 UNIT/ML FlexPen Commonly known as:  NOVOLOG Inject 35 Units into the skin 3 (three) times daily with meals.   insulin degludec 100 UNIT/ML Sopn FlexTouch Pen Commonly known as:  TRESIBA FLEXTOUCH Inject 0.4 mLs (40 Units total) into the skin daily with breakfast.   isosorbide mononitrate 30 MG 24 hr tablet Commonly known as:  IMDUR Take 0.5 tablets (15 mg total) by mouth daily.   metoCLOPramide 5 MG tablet Commonly known as:  REGLAN Take 1 tablet (5 mg total) by mouth 3 (three) times daily before meals.   metolazone 5 MG tablet Commonly known as:  ZAROXOLYN Take 1 tablet (5 mg total) by mouth daily.   metoprolol succinate 25 MG 24 hr tablet Commonly known as:  TOPROL-XL Take 1 tablet by mouth daily.   metoprolol tartrate 25 MG tablet Commonly known as:  LOPRESSOR Take 0.5 tablets (12.5 mg total) by mouth 2 (two) times daily.   OXYGEN Inhale into the lungs.   potassium chloride 20 MEQ/15ML (10%) Soln Take 30 mEq by mouth 2 (two) times daily.   torsemide 20 MG tablet Commonly known as:  DEMADEX Take 1 tablet (20 mg total) by mouth 2 (two) times daily.       Allergies:  Allergies  Allergen Reactions  . Broccoli [Brassica Oleracea Italica] Anaphylaxis  . Influenza Vaccines Anaphylaxis    Per patient  . Pneumococcal Vaccines Anaphylaxis    Per patient  . Trulicity [Dulaglutide] Hives and Nausea And Vomiting  . Amoxicillin Nausea And Vomiting  . Doxycycline Hives and Nausea And Vomiting  . Iron Other (See Comments)    GI intolerance  .  Latex Other (See Comments)    blisters  . Lisinopril Nausea Only  . Penicillins Other (See Comments)    GI intolerance, UTI, can tolerate in IV Tolerates cephalosporins  . Tape Other (See Comments)  Tears skin.  Please use "paper" tape  . Codeine Nausea And Vomiting and Rash  . Sulfa Antibiotics Nausea And Vomiting and Rash  . Sulfur Nausea And Vomiting and Rash    Past Medical History:  Diagnosis Date  . CAD (coronary artery disease) 12/12/2014   -s/p 2V PCI of the LAD/RCA with taxus stents -cardiologist is Dr. Carlis Abbott, [place]   . CHF (congestive heart failure) ([place])    sees Dr. Novella Rob  . Chronic kidney disease   . Depression   . Diabetes mellitus with renal complications (HCC)    PVD and retinopathy, S/p bilat ambutations  . Glaucoma   . Liver cirrhosis ([place])   . Lyme disease   . Retinal detachment    sees Dr. Rose Phi per her report  . S/P bilateral BKA (below knee amputation) ([place]) 11/28/2014    Past Surgical History:  Procedure Laterality Date  . ESOPHAGOGASTRODUODENOSCOPY (EGD) WITH PROPOFOL N/A 07/07/2016   Procedure: ESOPHAGOGASTRODUODENOSCOPY (EGD) WITH PROPOFOL;  Surgeon: Milus Banister, MD;  Location: [facility];  Service: Endoscopy;  Laterality: N/A;  . LASIK    . LEG AMPUTATION BELOW KNEE  09/09/2011, 09/11/2011  . TUBAL LIGATION      Family History  Problem Relation Age of Onset  . Arthritis Mother   . Heart disease Mother   . Mental illness Mother   . Diabetes Mother   . Arthritis Maternal Grandmother   . Diabetes Maternal Grandmother   . Arthritis Father   . CVA Father   . Hypertension Father   . Sudden death Paternal Grandfather     Social History:  reports that she has never smoked. She has never used smokeless tobacco. She reports that she does not drink alcohol or use drugs.    Review of Systems    Lipid history: On Lipitor for the last few years, taking 20 mg, Not clear if this is being followed by PCP or cardiologist   Lab Results   Component Value Date   CHOL 135 01/23/2017   HDL 26 (L) 01/23/2017   LDLCALC 92 01/23/2017   TRIG 87 01/23/2017   CHOLHDL 5.2 01/23/2017           Eyes:  history of significant Visual loss  since about 2005.    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  She has bilateral below-knee amputations   History of cirrhosis: Seen by gastroenterologist  She has history of variable early satiety, postprandial nausea or vomiting and intermittent episodes of vomiting, has not been evaluated for this before or treated   Physical Examination:  BP 110/80   Pulse 68   SpO2 91%     ASSESSMENT:  Diabetes type 2, uncontrolled with obesity See history of present illness for detailed discussion of current diabetes management, blood sugar patterns and problems identified  She has persistently poor control of her diabetes with A1c usually over 8%  Recently not able to assess her level of control because of frequent glucose monitoring and no fructosamine level being available Her diet is quite variable and discussed above and blood sugar responses depending on her portions, carbohydrate and fat intake She is not adjusting her mealtime doses based on what she is eating and this was discussed Also if she has gastroparesis this may affect her postprandial blood sugars to variable extent  Discussed that previously she had much more effective  control with [place] compared to Lantus and reassured her that this does not cause vomiting She was able to take less [place] with more even control.  VOMITING episodes, postprandial fullness and periodic nausea: Likely to be gastroparesis  PLAN:   She was advised to try checking her blood sugar from the forearm for convenience and comfort  She needs to check blood sugars at least twice a day at various times including after  meals  Discussed blood sugar targets at various times; also discussed that she will only be able to get the freestyle Libre if she is documenting blood sugar testing 4 times a day has required by Medicare  She will go back to [facility] starting with 40 units but may need to reduce it at least 5 units if fasting readings are getting lower as before  She may take her NovoLog right at meal times and adjust the dose between 25 and 35 based on what she is eating and her appetite  Trial of REGLAN for probable gastroparesis at least at dinnertime and then 3 times a day tolerated and benefiting from this  Follow-up in 1 month again  Patient Instructions  Change Lantus to [place] and 40 units and base dose on am sugar patterns  Novolog range 25-35 based on type of meal u will eat  Try forearm for blood testing  Check blood sugars on waking up  3-4/7 days  Also check blood sugars about 2 hours after a meal and do this after different meals by rotation  Recommended blood sugar levels on waking up is 90-130 and about 2 hours after meal is 130-160  Please bring your blood sugar monitor to each visit, thank you        [facility] 03/27/2017, 11:24 AM   Note: This office note was prepared with Dragon voice recognition system technology. Any transcriptional errors that result from this process are unintentional.

## 2017-04-06 ENCOUNTER — Encounter (HOSPITAL_COMMUNITY): Payer: Self-pay | Admitting: General Practice

## 2017-04-06 ENCOUNTER — Emergency Department (HOSPITAL_COMMUNITY): Payer: Medicare HMO

## 2017-04-06 ENCOUNTER — Inpatient Hospital Stay (HOSPITAL_COMMUNITY)
Admission: EM | Admit: 2017-04-06 | Discharge: 2017-04-19 | DRG: 291 | Disposition: A | Discharge status: Hospice | Payer: Medicare HMO | Attending: Internal Medicine | Admitting: Internal Medicine

## 2017-04-06 ENCOUNTER — Telehealth: Payer: Self-pay

## 2017-04-06 ENCOUNTER — Telehealth: Payer: Self-pay | Admitting: Family Medicine

## 2017-04-06 DIAGNOSIS — I471 Supraventricular tachycardia: Secondary | ICD-10-CM | POA: Diagnosis not present

## 2017-04-06 DIAGNOSIS — E872 Acidosis: Secondary | ICD-10-CM | POA: Diagnosis present

## 2017-04-06 DIAGNOSIS — I509 Heart failure, unspecified: Secondary | ICD-10-CM | POA: Insufficient documentation

## 2017-04-06 DIAGNOSIS — E1165 Type 2 diabetes mellitus with hyperglycemia: Secondary | ICD-10-CM | POA: Diagnosis not present

## 2017-04-06 DIAGNOSIS — Z955 Presence of coronary angioplasty implant and graft: Secondary | ICD-10-CM

## 2017-04-06 DIAGNOSIS — J9621 Acute and chronic respiratory failure with hypoxia: Secondary | ICD-10-CM | POA: Diagnosis present

## 2017-04-06 DIAGNOSIS — H548 Legal blindness, as defined in USA: Secondary | ICD-10-CM | POA: Diagnosis present

## 2017-04-06 DIAGNOSIS — N170 Acute kidney failure with tubular necrosis: Secondary | ICD-10-CM | POA: Diagnosis not present

## 2017-04-06 DIAGNOSIS — E1159 Type 2 diabetes mellitus with other circulatory complications: Secondary | ICD-10-CM | POA: Diagnosis not present

## 2017-04-06 DIAGNOSIS — Z515 Encounter for palliative care: Secondary | ICD-10-CM

## 2017-04-06 DIAGNOSIS — Z89511 Acquired absence of right leg below knee: Secondary | ICD-10-CM | POA: Diagnosis not present

## 2017-04-06 DIAGNOSIS — I5082 Biventricular heart failure: Secondary | ICD-10-CM | POA: Diagnosis present

## 2017-04-06 DIAGNOSIS — I739 Peripheral vascular disease, unspecified: Secondary | ICD-10-CM | POA: Diagnosis not present

## 2017-04-06 DIAGNOSIS — H409 Unspecified glaucoma: Secondary | ICD-10-CM | POA: Diagnosis present

## 2017-04-06 DIAGNOSIS — E1122 Type 2 diabetes mellitus with diabetic chronic kidney disease: Secondary | ICD-10-CM | POA: Diagnosis present

## 2017-04-06 DIAGNOSIS — I255 Ischemic cardiomyopathy: Secondary | ICD-10-CM | POA: Diagnosis not present

## 2017-04-06 DIAGNOSIS — Z4682 Encounter for fitting and adjustment of non-vascular catheter: Secondary | ICD-10-CM | POA: Diagnosis not present

## 2017-04-06 DIAGNOSIS — N184 Chronic kidney disease, stage 4 (severe): Secondary | ICD-10-CM | POA: Diagnosis present

## 2017-04-06 DIAGNOSIS — N183 Chronic kidney disease, stage 3 unspecified: Secondary | ICD-10-CM | POA: Diagnosis present

## 2017-04-06 DIAGNOSIS — F339 Major depressive disorder, recurrent, unspecified: Secondary | ICD-10-CM | POA: Diagnosis not present

## 2017-04-06 DIAGNOSIS — R197 Diarrhea, unspecified: Secondary | ICD-10-CM | POA: Diagnosis not present

## 2017-04-06 DIAGNOSIS — I130 Hypertensive heart and chronic kidney disease with heart failure and stage 1 through stage 4 chronic kidney disease, or unspecified chronic kidney disease: Principal | ICD-10-CM | POA: Diagnosis present

## 2017-04-06 DIAGNOSIS — E1139 Type 2 diabetes mellitus with other diabetic ophthalmic complication: Secondary | ICD-10-CM | POA: Diagnosis not present

## 2017-04-06 DIAGNOSIS — Z7189 Other specified counseling: Secondary | ICD-10-CM

## 2017-04-06 DIAGNOSIS — J810 Acute pulmonary edema: Secondary | ICD-10-CM | POA: Diagnosis not present

## 2017-04-06 DIAGNOSIS — Z79899 Other long term (current) drug therapy: Secondary | ICD-10-CM

## 2017-04-06 DIAGNOSIS — I492 Junctional premature depolarization: Secondary | ICD-10-CM | POA: Diagnosis not present

## 2017-04-06 DIAGNOSIS — Z9119 Patient's noncompliance with other medical treatment and regimen: Secondary | ICD-10-CM

## 2017-04-06 DIAGNOSIS — K7682 Hepatic encephalopathy: Secondary | ICD-10-CM

## 2017-04-06 DIAGNOSIS — R188 Other ascites: Secondary | ICD-10-CM | POA: Diagnosis present

## 2017-04-06 DIAGNOSIS — R17 Unspecified jaundice: Secondary | ICD-10-CM | POA: Diagnosis not present

## 2017-04-06 DIAGNOSIS — M797 Fibromyalgia: Secondary | ICD-10-CM | POA: Diagnosis present

## 2017-04-06 DIAGNOSIS — E7800 Pure hypercholesterolemia, unspecified: Secondary | ICD-10-CM | POA: Diagnosis present

## 2017-04-06 DIAGNOSIS — I493 Ventricular premature depolarization: Secondary | ICD-10-CM | POA: Diagnosis not present

## 2017-04-06 DIAGNOSIS — E1151 Type 2 diabetes mellitus with diabetic peripheral angiopathy without gangrene: Secondary | ICD-10-CM | POA: Diagnosis present

## 2017-04-06 DIAGNOSIS — Z66 Do not resuscitate: Secondary | ICD-10-CM | POA: Diagnosis present

## 2017-04-06 DIAGNOSIS — Z882 Allergy status to sulfonamides status: Secondary | ICD-10-CM

## 2017-04-06 DIAGNOSIS — E876 Hypokalemia: Secondary | ICD-10-CM | POA: Diagnosis present

## 2017-04-06 DIAGNOSIS — R079 Chest pain, unspecified: Secondary | ICD-10-CM | POA: Diagnosis not present

## 2017-04-06 DIAGNOSIS — E11319 Type 2 diabetes mellitus with unspecified diabetic retinopathy without macular edema: Secondary | ICD-10-CM | POA: Diagnosis present

## 2017-04-06 DIAGNOSIS — L8990 Pressure ulcer of unspecified site, unspecified stage: Secondary | ICD-10-CM | POA: Insufficient documentation

## 2017-04-06 DIAGNOSIS — F319 Bipolar disorder, unspecified: Secondary | ICD-10-CM | POA: Diagnosis present

## 2017-04-06 DIAGNOSIS — I5043 Acute on chronic combined systolic (congestive) and diastolic (congestive) heart failure: Secondary | ICD-10-CM | POA: Diagnosis present

## 2017-04-06 DIAGNOSIS — Z9981 Dependence on supplemental oxygen: Secondary | ICD-10-CM

## 2017-04-06 DIAGNOSIS — R319 Hematuria, unspecified: Secondary | ICD-10-CM | POA: Diagnosis not present

## 2017-04-06 DIAGNOSIS — Z887 Allergy status to serum and vaccine status: Secondary | ICD-10-CM

## 2017-04-06 DIAGNOSIS — I5040 Unspecified combined systolic (congestive) and diastolic (congestive) heart failure: Secondary | ICD-10-CM | POA: Diagnosis not present

## 2017-04-06 DIAGNOSIS — J45909 Unspecified asthma, uncomplicated: Secondary | ICD-10-CM | POA: Diagnosis present

## 2017-04-06 DIAGNOSIS — K7460 Unspecified cirrhosis of liver: Secondary | ICD-10-CM

## 2017-04-06 DIAGNOSIS — D6959 Other secondary thrombocytopenia: Secondary | ICD-10-CM | POA: Diagnosis present

## 2017-04-06 DIAGNOSIS — N179 Acute kidney failure, unspecified: Secondary | ICD-10-CM | POA: Diagnosis not present

## 2017-04-06 DIAGNOSIS — I4892 Unspecified atrial flutter: Secondary | ICD-10-CM | POA: Diagnosis not present

## 2017-04-06 DIAGNOSIS — Z89512 Acquired absence of left leg below knee: Secondary | ICD-10-CM

## 2017-04-06 DIAGNOSIS — I10 Essential (primary) hypertension: Secondary | ICD-10-CM | POA: Diagnosis not present

## 2017-04-06 DIAGNOSIS — Z880 Allergy status to penicillin: Secondary | ICD-10-CM

## 2017-04-06 DIAGNOSIS — K7290 Hepatic failure, unspecified without coma: Secondary | ICD-10-CM | POA: Diagnosis not present

## 2017-04-06 DIAGNOSIS — K7469 Other cirrhosis of liver: Secondary | ICD-10-CM | POA: Diagnosis not present

## 2017-04-06 DIAGNOSIS — Z993 Dependence on wheelchair: Secondary | ICD-10-CM

## 2017-04-06 DIAGNOSIS — Z881 Allergy status to other antibiotic agents status: Secondary | ICD-10-CM | POA: Diagnosis not present

## 2017-04-06 DIAGNOSIS — E785 Hyperlipidemia, unspecified: Secondary | ICD-10-CM | POA: Diagnosis not present

## 2017-04-06 DIAGNOSIS — N189 Chronic kidney disease, unspecified: Secondary | ICD-10-CM | POA: Diagnosis not present

## 2017-04-06 DIAGNOSIS — I429 Cardiomyopathy, unspecified: Secondary | ICD-10-CM | POA: Diagnosis not present

## 2017-04-06 DIAGNOSIS — K7581 Nonalcoholic steatohepatitis (NASH): Secondary | ICD-10-CM | POA: Diagnosis present

## 2017-04-06 DIAGNOSIS — R32 Unspecified urinary incontinence: Secondary | ICD-10-CM | POA: Diagnosis not present

## 2017-04-06 DIAGNOSIS — R0602 Shortness of breath: Secondary | ICD-10-CM

## 2017-04-06 DIAGNOSIS — I2510 Atherosclerotic heart disease of native coronary artery without angina pectoris: Secondary | ICD-10-CM | POA: Diagnosis not present

## 2017-04-06 DIAGNOSIS — Z6841 Body Mass Index (BMI) 40.0 and over, adult: Secondary | ICD-10-CM | POA: Diagnosis not present

## 2017-04-06 DIAGNOSIS — I5023 Acute on chronic systolic (congestive) heart failure: Secondary | ICD-10-CM | POA: Diagnosis present

## 2017-04-06 DIAGNOSIS — E8770 Fluid overload, unspecified: Secondary | ICD-10-CM | POA: Diagnosis not present

## 2017-04-06 DIAGNOSIS — K8020 Calculus of gallbladder without cholecystitis without obstruction: Secondary | ICD-10-CM | POA: Diagnosis present

## 2017-04-06 DIAGNOSIS — K7291 Hepatic failure, unspecified with coma: Secondary | ICD-10-CM | POA: Diagnosis not present

## 2017-04-06 DIAGNOSIS — I5084 End stage heart failure: Secondary | ICD-10-CM | POA: Diagnosis present

## 2017-04-06 DIAGNOSIS — Z794 Long term (current) use of insulin: Secondary | ICD-10-CM

## 2017-04-06 DIAGNOSIS — Z888 Allergy status to other drugs, medicaments and biological substances status: Secondary | ICD-10-CM

## 2017-04-06 DIAGNOSIS — Z885 Allergy status to narcotic agent status: Secondary | ICD-10-CM

## 2017-04-06 DIAGNOSIS — IMO0002 Reserved for concepts with insufficient information to code with codable children: Secondary | ICD-10-CM | POA: Diagnosis present

## 2017-04-06 DIAGNOSIS — Z978 Presence of other specified devices: Secondary | ICD-10-CM

## 2017-04-06 DIAGNOSIS — R140 Abdominal distension (gaseous): Secondary | ICD-10-CM | POA: Diagnosis not present

## 2017-04-06 DIAGNOSIS — F419 Anxiety disorder, unspecified: Secondary | ICD-10-CM | POA: Diagnosis present

## 2017-04-06 HISTORY — DX: Fibromyalgia: M79.7

## 2017-04-06 HISTORY — DX: Other chronic pain: G89.29

## 2017-04-06 HISTORY — DX: Headache: R51

## 2017-04-06 HISTORY — DX: Chronic kidney disease, stage 3 unspecified: N18.30

## 2017-04-06 HISTORY — DX: Headache, unspecified: R51.9

## 2017-04-06 HISTORY — DX: Pure hypercholesterolemia, unspecified: E78.00

## 2017-04-06 HISTORY — DX: Dorsalgia, unspecified: M54.9

## 2017-04-06 HISTORY — DX: Cardiac murmur, unspecified: R01.1

## 2017-04-06 HISTORY — DX: Personal history of other medical treatment: Z92.89

## 2017-04-06 HISTORY — DX: Chronic kidney disease, stage 3 (moderate): N18.3

## 2017-04-06 HISTORY — DX: Unspecified osteoarthritis, unspecified site: M19.90

## 2017-04-06 HISTORY — DX: Dependence on supplemental oxygen: Z99.81

## 2017-04-06 HISTORY — DX: Unspecified asthma, uncomplicated: J45.909

## 2017-04-06 HISTORY — DX: Unspecified glaucoma: H40.9

## 2017-04-06 HISTORY — DX: Bipolar disorder, unspecified: F31.9

## 2017-04-06 HISTORY — DX: Anxiety disorder, unspecified: F41.9

## 2017-04-06 HISTORY — DX: Unspecified cirrhosis of liver: K74.60

## 2017-04-06 LAB — I-STAT TROPONIN, ED: Troponin i, poc: 0.04 ng/mL (ref 0.00–0.08)

## 2017-04-06 LAB — URINALYSIS, ROUTINE W REFLEX MICROSCOPIC
Bilirubin Urine: NEGATIVE
GLUCOSE, UA: NEGATIVE mg/dL
Hgb urine dipstick: NEGATIVE
Ketones, ur: NEGATIVE mg/dL
LEUKOCYTES UA: NEGATIVE
NITRITE: NEGATIVE
Protein, ur: NEGATIVE mg/dL
SPECIFIC GRAVITY, URINE: 1.011 (ref 1.005–1.030)
pH: 6 (ref 5.0–8.0)

## 2017-04-06 LAB — CBC WITH DIFFERENTIAL/PLATELET
Basophils Absolute: 0 10*3/uL (ref 0.0–0.1)
Basophils Relative: 1 %
Eosinophils Absolute: 0.1 10*3/uL (ref 0.0–0.7)
Eosinophils Relative: 3 %
HCT: 39.5 % (ref 36.0–46.0)
Hemoglobin: 12.6 g/dL (ref 12.0–15.0)
Lymphocytes Relative: 7 %
Lymphs Abs: 0.3 10*3/uL — ABNORMAL LOW (ref 0.7–4.0)
MCH: 31.8 pg (ref 26.0–34.0)
MCHC: 31.9 g/dL (ref 30.0–36.0)
MCV: 99.7 fL (ref 78.0–100.0)
Monocytes Absolute: 0.4 10*3/uL (ref 0.1–1.0)
Monocytes Relative: 9 %
Neutro Abs: 3.5 10*3/uL (ref 1.7–7.7)
Neutrophils Relative %: 80 %
Platelets: 74 10*3/uL — ABNORMAL LOW (ref 150–400)
RBC: 3.96 MIL/uL (ref 3.87–5.11)
RDW: 16.1 % — ABNORMAL HIGH (ref 11.5–15.5)
WBC: 4.4 10*3/uL (ref 4.0–10.5)

## 2017-04-06 LAB — COMPREHENSIVE METABOLIC PANEL
ALT: 11 U/L — ABNORMAL LOW (ref 14–54)
AST: 30 U/L (ref 15–41)
Albumin: 3.3 g/dL — ABNORMAL LOW (ref 3.5–5.0)
Alkaline Phosphatase: 122 U/L (ref 38–126)
Anion gap: 11 (ref 5–15)
BUN: 36 mg/dL — ABNORMAL HIGH (ref 6–20)
CO2: 39 mmol/L — ABNORMAL HIGH (ref 22–32)
Calcium: 9.3 mg/dL (ref 8.9–10.3)
Chloride: 87 mmol/L — ABNORMAL LOW (ref 101–111)
Creatinine, Ser: 1.54 mg/dL — ABNORMAL HIGH (ref 0.44–1.00)
GFR calc Af Amer: 44 mL/min — ABNORMAL LOW (ref >60)
GFR calc non Af Amer: 38 mL/min — ABNORMAL LOW (ref >60)
Glucose, Bld: 153 mg/dL — ABNORMAL HIGH (ref 65–99)
Potassium: 3 mmol/L — ABNORMAL LOW (ref 3.5–5.1)
Sodium: 137 mmol/L (ref 135–145)
Total Bilirubin: 5.2 mg/dL — ABNORMAL HIGH (ref 0.3–1.2)
Total Protein: 7 g/dL (ref 6.5–8.1)

## 2017-04-06 LAB — BRAIN NATRIURETIC PEPTIDE: B Natriuretic Peptide: 455.6 pg/mL — ABNORMAL HIGH (ref 0.0–100.0)

## 2017-04-06 LAB — APTT: APTT: 34 s (ref 24–36)

## 2017-04-06 LAB — PROTIME-INR
INR: 1.2
Prothrombin Time: 15.2 seconds (ref 11.4–15.2)

## 2017-04-06 LAB — LIPASE, BLOOD: Lipase: 30 U/L (ref 11–51)

## 2017-04-06 MED ORDER — METOPROLOL SUCCINATE ER 25 MG PO TB24
25.0000 mg | ORAL_TABLET | Freq: Every day | ORAL | Status: DC
  Administered 2017-04-07 – 2017-04-10 (×4): 25 mg via ORAL
  Filled 2017-04-06 (×4): qty 1

## 2017-04-06 MED ORDER — SODIUM CHLORIDE 0.9% FLUSH: 3.0000 mL | INTRAVENOUS | Status: DC | PRN

## 2017-04-06 MED ORDER — ONDANSETRON HCL 4 MG/2ML IJ SOLN
4.0000 mg | Freq: Four times a day (QID) | INTRAMUSCULAR | Status: DC | PRN
  Administered 2017-04-11: 4 mg via INTRAVENOUS
  Filled 2017-04-06: qty 2

## 2017-04-06 MED ORDER — POTASSIUM CHLORIDE CRYS ER 20 MEQ PO TBCR
20.0000 meq | EXTENDED_RELEASE_TABLET | Freq: Two times a day (BID) | ORAL | Status: DC
  Administered 2017-04-07: 20 meq via ORAL
  Filled 2017-04-06: qty 1

## 2017-04-06 MED ORDER — INSULIN ASPART 100 UNIT/ML ~~LOC~~ SOLN
35.0000 [IU] | Freq: Three times a day (TID) | SUBCUTANEOUS | Status: DC
  Administered 2017-04-07 – 2017-04-08 (×2): 35 [IU] via SUBCUTANEOUS
  Administered 2017-04-09: 20 [IU] via SUBCUTANEOUS
  Administered 2017-04-09: 25 [IU] via SUBCUTANEOUS
  Administered 2017-04-09: 35 [IU] via SUBCUTANEOUS

## 2017-04-06 MED ORDER — ACETAMINOPHEN 325 MG PO TABS
650.0000 mg | ORAL_TABLET | ORAL | Status: DC | PRN
  Administered 2017-04-07: 325 mg via ORAL
  Administered 2017-04-10: 650 mg via ORAL
  Filled 2017-04-06 (×3): qty 2

## 2017-04-06 MED ORDER — HYDRALAZINE HCL 10 MG PO TABS
10.0000 mg | ORAL_TABLET | Freq: Two times a day (BID) | ORAL | Status: DC
  Administered 2017-04-07 (×2): 10 mg via ORAL
  Filled 2017-04-06 (×4): qty 1

## 2017-04-06 MED ORDER — ASPIRIN 325 MG PO TABS
325.0000 mg | ORAL_TABLET | Freq: Every day | ORAL | Status: DC
  Administered 2017-04-07 – 2017-04-09 (×4): 325 mg via ORAL
  Filled 2017-04-06 (×4): qty 1

## 2017-04-06 MED ORDER — ATORVASTATIN CALCIUM 20 MG PO TABS
20.0000 mg | ORAL_TABLET | Freq: Every evening | ORAL | Status: DC
  Administered 2017-04-07 – 2017-04-16 (×10): 20 mg via ORAL
  Filled 2017-04-06 (×10): qty 1

## 2017-04-06 MED ORDER — ISOSORBIDE MONONITRATE ER 30 MG PO TB24
15.0000 mg | ORAL_TABLET | Freq: Every day | ORAL | Status: DC
  Administered 2017-04-07: 15 mg via ORAL
  Filled 2017-04-06 (×2): qty 1

## 2017-04-06 MED ORDER — POTASSIUM CHLORIDE 20 MEQ/15ML (10%) PO SOLN: 30.0000 meq | Freq: Two times a day (BID) | ORAL | Status: DC

## 2017-04-06 MED ORDER — FUROSEMIDE 10 MG/ML IJ SOLN
40.0000 mg | Freq: Two times a day (BID) | INTRAMUSCULAR | Status: DC
  Administered 2017-04-07 – 2017-04-09 (×5): 40 mg via INTRAVENOUS
  Filled 2017-04-06 (×5): qty 4

## 2017-04-06 MED ORDER — SODIUM CHLORIDE 0.9% FLUSH
3.0000 mL | Freq: Two times a day (BID) | INTRAVENOUS | Status: DC
  Administered 2017-04-07 – 2017-04-19 (×19): 3 mL via INTRAVENOUS

## 2017-04-06 MED ORDER — FUROSEMIDE 10 MG/ML IJ SOLN
40.0000 mg | Freq: Once | INTRAMUSCULAR | Status: AC
  Administered 2017-04-06: 40 mg via INTRAVENOUS
  Filled 2017-04-06: qty 4

## 2017-04-06 MED ORDER — METOLAZONE 5 MG PO TABS
5.0000 mg | ORAL_TABLET | Freq: Every day | ORAL | Status: DC
  Administered 2017-04-07 (×2): 5 mg via ORAL
  Filled 2017-04-06 (×3): qty 1

## 2017-04-06 MED ORDER — METOLAZONE 5 MG PO TABS: 5.0000 mg | ORAL_TABLET | ORAL | Status: DC

## 2017-04-06 MED ORDER — SODIUM CHLORIDE 0.9 % IV SOLN: 250.0000 mL | INTRAVENOUS | Status: DC | PRN

## 2017-04-06 NOTE — ED Notes (Signed)
Patient transported to X-ray 

## 2017-04-06 NOTE — Telephone Encounter (Signed)
She will need to request the hospitalist to call me

## 2017-04-06 NOTE — ED Notes (Signed)
Pt returned from xray at this time. Bed locked and lowered, call bell in reach. Pt informed a urine sample is needed.

## 2017-04-06 NOTE — H&P (Signed)
History and Physical    Sue Martin ZOX:[id number] DOB: 1966/06/24 DOA: 04/06/2017  PCP: Terressa Koyanagi, DO  Patient coming from:  home  Chief Complaint:  sob  HPI: Sue Martin is a 51 y.o. female with medical history significant of CHF EF 35%, CAD, DM, cirrhosis of the liver, CKD comes in with over a week of gaining weight in her pannus and more sob than usual.  She is on 2 liters oxygen all the time.  Her kidney doctor recently decreased her xaraxolyn and she reports every time this is done she gets fluid overloaded.  No fevers.  No chest pain.  No cough.  Review of Systems: As per HPI otherwise 10 point review of systems negative.   Past Medical History:  Diagnosis Date  . CAD (coronary artery disease) 12/12/2014   -s/p 2V PCI of the LAD/RCA with taxus stents -cardiologist is Dr. Carmon Sails, [facility]   . CHF (congestive heart failure) (HCC)    sees Dr. Wynonia Hazard  . Chronic kidney disease   . Depression   . Diabetes mellitus with renal complications (HCC)    PVD and retinopathy, S/p bilat ambutations  . Glaucoma   . Liver cirrhosis (HCC)   . Lyme disease   . Retinal detachment    sees Dr. Johna Sheriff per her report  . 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 ESOPHAGOGASTRODUODENOSCOPY (EGD) WITH PROPOFOL N/A 07/07/2016   Procedure: ESOPHAGOGASTRODUODENOSCOPY (EGD) WITH PROPOFOL;  Surgeon: Rachael Fee, MD;  Location: [facility];  Service: Endoscopy;  Laterality: N/A;  . LASIK    . LEG AMPUTATION BELOW KNEE  09/09/2011, 09/11/2011  . TUBAL LIGATION       reports that she has never smoked. She has never used smokeless tobacco. She reports that she does not drink alcohol or use drugs.  Allergies  Allergen Reactions  . Broccoli [Brassica Oleracea Italica] Anaphylaxis  . Influenza Vaccines Anaphylaxis    Per patient  . Pneumococcal Vaccines Anaphylaxis    Per patient  . Trulicity [Dulaglutide] Hives  and Nausea And Vomiting  . Amoxicillin Nausea And Vomiting  . Doxycycline Hives and Nausea And Vomiting  . Iron Other (See Comments)    GI intolerance  . Latex Other (See Comments)    blisters  . Lisinopril Nausea Only  . Penicillins Other (See Comments)    GI intolerance, UTI, can tolerate in IV Tolerates cephalosporins Has patient had a PCN reaction causing immediate rash, facial/tongue/throat swelling, SOB or lightheadedness with hypotension: No Has patient had a PCN reaction causing severe rash involving mucus membranes or skin necrosis: No Has patient had a PCN reaction that required hospitalization: No Has patient had a PCN reaction occurring within the last 10 years: No If all of the above answers are "NO", then may proceed with Cephalosporin   . Tape Other (See Comments)    Tears skin.  Please use "paper" tape  . Codeine Nausea And Vomiting and Rash  . Sulfa Antibiotics Nausea And Vomiting and Rash  . Sulfur Nausea And Vomiting and Rash    Family History  Problem Relation Age of Onset  . Arthritis Mother   . Heart disease Mother   . Mental illness Mother   . Diabetes Mother   . Arthritis Maternal Grandmother   . Diabetes Maternal Grandmother   . Arthritis Father   . CVA Father   . Hypertension Father   . Sudden death Paternal  Grandfather     Prior to Admission medications   Medication Sig Start Date End Date Taking? Authorizing Provider  aspirin 325 MG tablet Take 325 mg by mouth at bedtime.    Yes [provider]  atorvastatin (LIPITOR) 20 MG tablet Take 1 tablet (20 mg total) by mouth daily. Patient taking differently: Take 20 mg by mouth every evening.  02/11/16  Yes Terressa Koyanagi, DO  cyclobenzaprine (FLEXERIL) 5 MG tablet Take 1 tablet (5 mg total) by mouth 2 (two) times daily as needed for muscle spasms. 11/17/16  Yes Kriste Basque R, DO  hydrALAZINE (APRESOLINE) 10 MG tablet Take 10 mg by mouth 2 (two) times daily.   Yes [provider]    insulin aspart (NOVOLOG) 100 UNIT/ML FlexPen Inject 35 Units into the skin 3 (three) times daily with meals. 11/30/16  Yes Reather Littler, MD  insulin degludec (TRESIBA FLEXTOUCH) 100 UNIT/ML SOPN FlexTouch Pen Inject 0.4 mLs (40 Units total) into the skin daily with breakfast. Patient taking differently: Inject 30 Units into the skin daily with breakfast. 50U IN AM 10/28/16  Yes Reather Littler, MD  isosorbide mononitrate (IMDUR) 30 MG 24 hr tablet Take 0.5 tablets (15 mg total) by mouth daily. 01/31/17  Yes Penny Pia, MD  metoCLOPramide (REGLAN) 5 MG tablet Take 1 tablet (5 mg total) by mouth 3 (three) times daily before meals. 03/27/17  Yes Reather Littler, MD  metolazone (ZAROXOLYN) 5 MG tablet Take 1 tablet (5 mg total) by mouth daily. Patient taking differently: Take 5 mg by mouth 2 (two) times a week. Take Sunday and Thursday 02/01/17  Yes Penny Pia, MD  metoprolol succinate (TOPROL-XL) 25 MG 24 hr tablet Take 1 tablet by mouth daily. 12/30/16  Yes [provider]  OXYGEN 2 L by Continuous infusion (non-IV) route daily.    Yes [provider]  potassium chloride 20 MEQ/15ML (10%) SOLN Take 30 mEq by mouth 2 (two) times daily.  10/29/16  Yes [provider]  torsemide (DEMADEX) 20 MG tablet Take 1 tablet (20 mg total) by mouth 2 (two) times daily. 02/01/17  Yes Penny Pia, MD  metoprolol tartrate (LOPRESSOR) 25 MG tablet Take 0.5 tablets (12.5 mg total) by mouth 2 (two) times daily. Patient not taking: Reported on 04/06/2017 01/30/17   Penny Pia, MD    Physical Exam: Vitals:   04/06/17 1725 04/06/17 1736 04/06/17 1757 04/06/17 1900  BP:   113/75 114/71  Pulse:   74 81  Resp:   (!) 22 20  Temp:   97.9 F (36.6 C)   TempSrc:   Oral   SpO2:  99% 96% 100%  Weight: 117.9 kg (260 lb)     Height:  (1.397 m)       Constitutional: NAD, calm, comfortable Vitals:   04/06/17 1725 04/06/17 1736 04/06/17 1757 04/06/17 1900  BP:   113/75 114/71  Pulse:   74 81  Resp:    (!) 22 20  Temp:   97.9 F (36.6 C)   TempSrc:   Oral   SpO2:  99% 96% 100%  Weight: 117.9 kg (260 lb)     Height:  (1.397 m)      Eyes: PERRL, lids and conjunctivae normal ENMT: Mucous membranes are moist. Posterior pharynx clear of any exudate or lesions.Normal dentition.  Neck: normal, supple, no masses, no thyromegaly Respiratory: clear to auscultation bilaterally, no wheezing, no crackles. Normal respiratory effort. No accessory muscle use.  Cardiovascular: Regular rate and rhythm,  no murmurs / rubs / gallops. 1+ extremity edema. 2+ pedal pulses. No carotid bruits.  Abdomen: no tenderness, no masses palpated. No hepatosplenomegaly. Bowel sounds positive.  Pannus edema moderate Musculoskeletal: no clubbing / cyanosis. No joint deformity upper and lower extremities. Good ROM, no contractures. Normal muscle tone.  Skin: no rashes, lesions, ulcers. No induration Neurologic: CN 2-12 grossly intact. Sensation intact, DTR normal. Strength 5/5 in all 4.  Psychiatric: Normal judgment and insight. Alert and oriented x 3. Normal mood.    Labs on Admission: I have personally reviewed following labs and imaging studies  CBC:  Recent Labs Lab 04/06/17 1810  WBC 4.4  NEUTROABS 3.5  HGB 12.6  HCT 39.5  MCV 99.7  PLT 74*   Basic Metabolic Panel:  Recent Labs Lab 04/06/17 1810  NA 137  K 3.0*  CL 87*  CO2 39*  GLUCOSE 153*  BUN 36*  CREATININE 1.54*  CALCIUM 9.3   GFR: Estimated Creatinine Clearance: 46.6 mL/min (A) (by C-G formula based on SCr of 1.54 mg/dL (H)). Liver Function Tests:  Recent Labs Lab 04/06/17 1810  AST 30  ALT 11*  ALKPHOS 122  BILITOT 5.2*  PROT 7.0  ALBUMIN 3.3*    Recent Labs Lab 04/06/17 1810  LIPASE 30   Coagulation Profile:  Recent Labs Lab 04/06/17 1927  INR 1.20    Radiological Exams on Admission: Dg Chest 2 View  Result Date: 04/06/2017 CLINICAL DATA:  Shortness of breath EXAM: CHEST  2 VIEW COMPARISON:  Chest  radiograph 01/27/2017 FINDINGS: Unchanged cardiomegaly. Right greater than left basilar opacities. No pneumothorax or pleural effusion. IMPRESSION: Cardiomegaly and right-greater-than-left basilar predominant opacities. Pulmonary edema favored, though pneumonia would be difficult to exclude in the appropriate context. Electronically Signed   By: Deatra Robinson M.D.   On: 04/06/2017 18:50    EKG: Independently reviewed.  afib no acute issues Echo 7/18 EF 35% Old chart reviewed   Assessment/Plan 50 yo female with wt gain, worsening sob and acute on chronic chf exacerbation  Principal Problem:   Acute on chronic systolic CHF (congestive heart failure) (HCC)- just had echo in July will not repeat.  Likely due to decrease dosage of diuretics lately likely due to kidney disease.  Resume zaraxolyn dosing and place on lasix  iv q 12 hour for the next day or so and monitor renal fxn closely while increasing diuretics.  Active Problems:   Cirrhosis of liver without ascites (HCC)- noted   S/P bilateral BKA (below knee amputation) (HCC)= stable   CAD (coronary artery disease)- stable   CKD (chronic kidney disease) stage 3, GFR 30-59 ml/min (HCC)- baseline around 1.5, last month was 1.o back up to 1.5.     Type II diabetes mellitus with ophthalmic manifestations, uncontrolled (HCC)- cont long acting, ssi   Cardiomyopathy, ischemic- EF 35%.  As above   DVT prophylaxis:  scds Code Status:  full Family Communication:  none Disposition Plan:  Per day team Consults called:  none Admission status:   admission   Kery Haltiwanger A MD [facility]  If 7PM-7AM, please contact night-coverage [email or web address] Password [email or web address]  04/06/2017, 8:31 PM

## 2017-04-06 NOTE — ED Notes (Signed)
Consulting MD at bedside

## 2017-04-06 NOTE — Telephone Encounter (Signed)
Patient wants to let the provider know that she is going to the hospital. She is jaundice and not feeling well. She was told by Dr. Marisue Humble to call her GI Dr. But she has not received a response. She just wants Dr. Selena Batten to know she is headed to the Hospital.

## 2017-04-06 NOTE — ED Triage Notes (Signed)
Pt arrives to ED with complaints of SOB worsening over the past few days. Pt has sx hx of COPD and CHF, wears 2L of O2 at home. Pt states she has also had intermittent dull chest pressure over the last few days. Pt placed in position of comfort with call bell in reach.

## 2017-04-06 NOTE — Telephone Encounter (Signed)
Patient called our office today and I went up front to use the phone to talk to her. She stated that she is going to the hospital today because she is jaundice and she needs to be seen for her kidneys and liver. She stated that she only wants you for her diabetes doctor and she wants to know if you can treat her while she is in the hospital?  Please advise.

## 2017-04-06 NOTE — ED Provider Notes (Signed)
[facility]-EMER[facility]ENCY DEPT Provider Note   CSN: [id number] Arrival date & time: 04/06/17  1728     History   Chief Complaint Chief Complaint  Patient presents with  . Shortness of Breath    HPI Sue Martin is a 51 y.o. female with history of insulin-dependent DM type 2, CAD, CHF, liver cirrhosis, s/p bilateral BKA who presents today with chief complaint acute onset, progressively worsening jaundice and shortness of breath for 3 days. Patient states "I'm yellow and I've never been yellow before". She also endorses DOE, orthopnea, PND, and states that she feels that she is fluid overloaded. Also endorses cough productive of clear sputum and substernal chest pressure but denies pain. Endorses generalized sharp abdominal pain which worsened after meals but states it has been ongoing and worsening over the course of one month. Endorses vomiting after meals which has been ongoing for the same amount of time and unchanged. Denies fevers but endorses chills. Has been taking her usual medications without significant improvement. She was seen and evaluated by her endocrinologist 10 days ago and was started on Reglan for probable gastroparesis. Also started on Tresiba instead of Lantus. Endorses decreased urine output but denies any other urinary symptoms, no melena, hematochezia, diarrhea, constipation. She is on 2LPM Bowlegs at home 24/7.   The history is provided by the patient.    Past Medical History:  Diagnosis Date  . CAD (coronary artery disease) 12/12/2014   -s/p 2V PCI of the LAD/RCA with taxus stents -cardiologist is Dr. Carmon Sails, [facility]   . CHF (congestive heart failure) (HCC)    sees Dr. Wynonia Hazard  . Chronic kidney disease   . Depression   . Diabetes mellitus with renal complications (HCC)    PVD and retinopathy, S/p bilat ambutations  . Glaucoma   . Liver cirrhosis (HCC)   . Lyme disease   . Retinal detachment    sees Dr. Johna Sheriff per her report  . S/P bilateral BKA (below knee  amputation) (HCC) 11/28/2014    Patient Active Problem List   Diagnosis Date Noted  . CHF (congestive heart failure) (HCC) 04/06/2017  . Chest pain 01/22/2017  . Acute on chronic respiratory failure with hypoxia (HCC) 01/22/2017  . Thrombocytopenia (HCC) 01/22/2017  . Cardiomyopathy, ischemic 06/13/2016  . Type II diabetes mellitus with ophthalmic manifestations, uncontrolled (HCC) 12/22/2014  . Hyperlipidemia 12/22/2014  . CAD (coronary artery disease) 12/12/2014  . Diabetic retinopathy (HCC) 12/12/2014  . PVD (peripheral vascular disease) (HCC) 12/12/2014  . CKD (chronic kidney disease) stage 3, GFR 30-59 ml/min (HCC) 12/12/2014  . Type 2 diabetes mellitus with diabetic nephropathy (HCC) 11/28/2014  . Acute on chronic systolic CHF (congestive heart failure) (HCC) 11/28/2014  . Cirrhosis of liver without ascites (HCC) 11/28/2014  . Generalized OA 11/28/2014  . 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 ESOPHAGOGASTRODUODENOSCOPY (EGD) WITH PROPOFOL N/A 07/07/2016   Procedure: ESOPHAGOGASTRODUODENOSCOPY (EGD) WITH PROPOFOL;  Surgeon: Rachael Fee, MD;  Location: [facility];  Service: Endoscopy;  Laterality: N/A;  . LASIK    . LEG AMPUTATION BELOW KNEE  09/09/2011, 09/11/2011  . TUBAL LIGATION      OB History    No data available       Home Medications    Prior to Admission medications   Medication Sig Start Date End Date Taking? Authorizing Provider  aspirin 325 MG tablet Take 325 mg by mouth at bedtime.    Yes [provider]  atorvastatin (LIPITOR) 20 MG tablet Take 1 tablet (20 mg total) by mouth daily. Patient taking differently: Take 20 mg by mouth every evening.  02/11/16  Yes Terressa Koyanagi, DO  cyclobenzaprine (FLEXERIL) 5 MG tablet Take 1 tablet (5 mg total) by mouth 2 (two) times daily as needed for muscle spasms. 11/17/16  Yes Kriste Basque R, DO  hydrALAZINE (APRESOLINE) 10 MG tablet  Take 10 mg by mouth 2 (two) times daily.   Yes [provider]  insulin aspart (NOVOLOG) 100 UNIT/ML FlexPen Inject 35 Units into the skin 3 (three) times daily with meals. 11/30/16  Yes Reather Littler, MD  insulin degludec (TRESIBA FLEXTOUCH) 100 UNIT/ML SOPN FlexTouch Pen Inject 0.4 mLs (40 Units total) into the skin daily with breakfast. Patient taking differently: Inject 30 Units into the skin daily with breakfast. 50U IN AM 10/28/16  Yes Reather Littler, MD  isosorbide mononitrate (IMDUR) 30 MG 24 hr tablet Take 0.5 tablets (15 mg total) by mouth daily. 01/31/17  Yes Penny Pia, MD  metoCLOPramide (REGLAN) 5 MG tablet Take 1 tablet (5 mg total) by mouth 3 (three) times daily before meals. 03/27/17  Yes Reather Littler, MD  metolazone (ZAROXOLYN) 5 MG tablet Take 1 tablet (5 mg total) by mouth daily. Patient taking differently: Take 5 mg by mouth 2 (two) times a week. Take Sunday and Thursday 02/01/17  Yes Penny Pia, MD  metoprolol succinate (TOPROL-XL) 25 MG 24 hr tablet Take 1 tablet by mouth daily. 12/30/16  Yes [provider]  OXYGEN 2 L by Continuous infusion (non-IV) route daily.    Yes [provider]  potassium chloride 20 MEQ/15ML (10%) SOLN Take 30 mEq by mouth 2 (two) times daily.  10/29/16  Yes [provider]  torsemide (DEMADEX) 20 MG tablet Take 1 tablet (20 mg total) by mouth 2 (two) times daily. 02/01/17  Yes Penny Pia, MD  metoprolol tartrate (LOPRESSOR) 25 MG tablet Take 0.5 tablets (12.5 mg total) by mouth 2 (two) times daily. Patient not taking: Reported on 04/06/2017 01/30/17   Penny Pia, MD    Family History Family History  Problem Relation Age of Onset  . Arthritis Mother   . Heart disease Mother   . Mental illness Mother   . Diabetes Mother   . Arthritis Maternal Grandmother   . Diabetes Maternal Grandmother   . Arthritis Father   . CVA Father   . Hypertension Father   . Sudden death Paternal Grandfather     Social History Social  History  Substance Use Topics  . Smoking status: Never Smoker  . Smokeless tobacco: Never Used  . Alcohol use No     Allergies   Broccoli [brassica oleracea italica]; Influenza vaccines; Pneumococcal vaccines; Trulicity [dulaglutide]; Amoxicillin; Doxycycline; Iron; Latex; Lisinopril; Penicillins; Tape; Codeine; Sulfa antibiotics; and Sulfur   Review of Systems Review of Systems  Constitutional: Positive for chills. Negative for fever.  Respiratory: Positive for cough and shortness of breath.   Cardiovascular: Negative for chest pain.  Gastrointestinal: Positive for abdominal pain, nausea and vomiting. Negative for blood in stool, constipation and diarrhea.  Genitourinary: Positive for decreased urine volume. Negative for dysuria and hematuria.  Skin: Positive for color change.  All other systems reviewed and are negative.    Physical Exam Updated Vital Signs BP 114/71   Pulse 81   Temp 97.9 F (36.6 C) (Oral)   Resp 20   Ht  (1.397 m)   Wt 117.9 kg (260  lb)   SpO2 100%   BMI 60.43 kg/m   Physical Exam  Constitutional: She appears well-developed and well-nourished. No distress.  HENT:  Head: Normocephalic and atraumatic.  Eyes: Conjunctivae are normal. Right eye exhibits no discharge. Left eye exhibits no discharge. Scleral icterus is present.  Mild scleral icterus bilaterally  Neck: No JVD present. No tracheal deviation present.  Cardiovascular: Normal rate, regular rhythm and intact distal pulses.   2+ radial pulses bl, bilateral BKA   Pulmonary/Chest: Effort normal.  Globally diminished breath sounds with scattered crackles in the posterior lung fields, equal rise and fall of chest  Abdominal: Bowel sounds are normal. She exhibits distension. There is tenderness.  Abdominal wall firm, maximally tender to palpation in the right upper quadrant. Murphy sign absent, Rovsing's absent, no CVA tenderness  Musculoskeletal: She exhibits no tenderness.  bl BKA.     Neurological: She is alert.  Skin: Skin is warm and dry. No erythema.  jaundiced  Psychiatric: She has a normal mood and affect. Her behavior is normal.  Nursing note and vitals reviewed.    ED Treatments / Results  Labs (all labs ordered are listed, but only abnormal results are displayed) Labs Reviewed  CBC WITH DIFFERENTIAL/PLATELET - Abnormal; Notable for the following:       Result Value   RDW 16.1 (*)    Platelets 74 (*)    Lymphs Abs 0.3 (*)    All other components within normal limits  BRAIN NATRIURETIC PEPTIDE - Abnormal; Notable for the following:    B Natriuretic Peptide 455.6 (*)    All other components within normal limits  COMPREHENSIVE METABOLIC PANEL - Abnormal; Notable for the following:    Potassium 3.0 (*)    Chloride 87 (*)    CO2 39 (*)    Glucose, Bld 153 (*)    BUN 36 (*)    Creatinine, Ser 1.54 (*)    Albumin 3.3 (*)    ALT 11 (*)    Total Bilirubin 5.2 (*)    GFR calc non Af Amer 38 (*)    GFR calc Af Amer 44 (*)    All other components within normal limits  LIPASE, BLOOD  PROTIME-INR  APTT  URINALYSIS, ROUTINE W REFLEX MICROSCOPIC  I-STAT TROPONIN, ED    EKG  EKG Interpretation  Date/Time:  Thursday April 06 2017 17:56:06 EDT Ventricular Rate:  83 PR Interval:    QRS Duration: 122 QT Interval:  374 QTC Calculation: 380 R Axis:   -60 Text Interpretation:  Atrial fibrillation Paired ventricular premature complexes Nonspecific IVCD with LAD Probable lateral infarct, age indeterminate No acute changes Confirmed by Derwood Kaplan [phone]) on 04/06/2017 6:32:19 PM       Radiology Dg Chest 2 View  Result Date: 04/06/2017 CLINICAL DATA:  Shortness of breath EXAM: CHEST  2 VIEW COMPARISON:  Chest radiograph 01/27/2017 FINDINGS: Unchanged cardiomegaly. Right greater than left basilar opacities. No pneumothorax or pleural effusion. IMPRESSION: Cardiomegaly and right-greater-than-left basilar predominant opacities. Pulmonary edema favored,  though pneumonia would be difficult to exclude in the appropriate context. Electronically Signed   By: Deatra Robinson M.D.   On: 04/06/2017 18:50    Procedures Procedures (including critical care time)  Medications Ordered in ED Medications  furosemide (LASIX) injection 40 mg (40 mg Intravenous Given 04/06/17 2047)     Initial Impression / Assessment and Plan / ED Course  I have reviewed the triage vital signs and the nursing notes.  Pertinent labs & imaging results  that were available during my care of the patient were reviewed by me and considered in my medical decision making (see chart for details).     Patient with multiple chronic medical issues presents with worsening shortness of breath and new onset jaundice. Afebrile, vital signs are stable and she is on her usual 2 L nasal cannula. Crackles heard on auscultation of the lungs and she has Right upper quadrant tenderness on palpation of the abdomen. EKG shows no acute changes and initial troponin is negative, however chest x-ray shows likely pulmonary edema although could be suggestive of pneumonia. She does have an elevation in her total bilirubin at 5.2, slightly worsening kidney function today as well. Patient seen and evaluated by Dr. Rhunette Croft, who spoke with hospitalist Dr. Onalee Hua; she agrees to assume care of patient and bring her into the hospital for further workup and management.  Final Clinical Impressions(s) / ED Diagnoses   Final diagnoses:  Acute pulmonary edema [facility])    New Prescriptions New Prescriptions   No medications on file     Bennye Alm 04/07/17 1648    Derwood Kaplan, MD 04/08/17 2203

## 2017-04-07 ENCOUNTER — Inpatient Hospital Stay (HOSPITAL_COMMUNITY): Payer: Medicare HMO

## 2017-04-07 ENCOUNTER — Telehealth: Payer: Self-pay | Admitting: Endocrinology

## 2017-04-07 DIAGNOSIS — E8770 Fluid overload, unspecified: Secondary | ICD-10-CM

## 2017-04-07 DIAGNOSIS — I5023 Acute on chronic systolic (congestive) heart failure: Secondary | ICD-10-CM

## 2017-04-07 DIAGNOSIS — R17 Unspecified jaundice: Secondary | ICD-10-CM

## 2017-04-07 DIAGNOSIS — R140 Abdominal distension (gaseous): Secondary | ICD-10-CM

## 2017-04-07 DIAGNOSIS — K7469 Other cirrhosis of liver: Secondary | ICD-10-CM

## 2017-04-07 DIAGNOSIS — K7460 Unspecified cirrhosis of liver: Secondary | ICD-10-CM

## 2017-04-07 LAB — GLUCOSE, CAPILLARY
GLUCOSE-CAPILLARY: 118 mg/dL — AB (ref 65–99)
GLUCOSE-CAPILLARY: 173 mg/dL — AB (ref 65–99)
Glucose-Capillary: 203 mg/dL — ABNORMAL HIGH (ref 65–99)
Glucose-Capillary: 96 mg/dL (ref 65–99)

## 2017-04-07 LAB — BASIC METABOLIC PANEL
Anion gap: 11 (ref 5–15)
BUN: 35 mg/dL — AB (ref 6–20)
CALCIUM: 9.3 mg/dL (ref 8.9–10.3)
CHLORIDE: 87 mmol/L — AB (ref 101–111)
CO2: 39 mmol/L — ABNORMAL HIGH (ref 22–32)
CREATININE: 1.51 mg/dL — AB (ref 0.44–1.00)
GFR, EST AFRICAN AMERICAN: 45 mL/min — AB (ref >60)
GFR, EST NON AFRICAN AMERICAN: 39 mL/min — AB (ref >60)
Glucose, Bld: 119 mg/dL — ABNORMAL HIGH (ref 65–99)
Potassium: 2.8 mmol/L — ABNORMAL LOW (ref 3.5–5.1)
SODIUM: 137 mmol/L (ref 135–145)

## 2017-04-07 MED ORDER — INSULIN DEGLUDEC 100 UNIT/ML ~~LOC~~ SOPN: 40.0000 [IU] | PEN_INJECTOR | Freq: Every day | SUBCUTANEOUS | Status: DC

## 2017-04-07 MED ORDER — METOCLOPRAMIDE HCL 10 MG PO TABS
5.0000 mg | ORAL_TABLET | Freq: Three times a day (TID) | ORAL | Status: DC
  Administered 2017-04-07 – 2017-04-15 (×23): 5 mg via ORAL
  Filled 2017-04-07 (×23): qty 1

## 2017-04-07 MED ORDER — INSULIN GLARGINE 100 UNIT/ML ~~LOC~~ SOLN
50.0000 [IU] | Freq: Every day | SUBCUTANEOUS | Status: DC
  Filled 2017-04-07 (×2): qty 0.5

## 2017-04-07 MED ORDER — POTASSIUM CHLORIDE CRYS ER 20 MEQ PO TBCR
40.0000 meq | EXTENDED_RELEASE_TABLET | Freq: Two times a day (BID) | ORAL | Status: DC
  Administered 2017-04-07 – 2017-04-09 (×5): 40 meq via ORAL
  Filled 2017-04-07 (×5): qty 2

## 2017-04-07 NOTE — Telephone Encounter (Signed)
Called patient and let her know the message from Dr. Lucianne Muss. She stated that she understood and she currently has Dr. Trinna Post will be her prime doctor for this weekend. She stated that she will have a heart doctor and a kidney doctor.

## 2017-04-07 NOTE — Consult Note (Addendum)
Cardiology Consultation:   Patient ID: Sue Martin; [id number]; [birth year]   Admit date: 04/06/2017 Date of Consult: 04/07/2017  Primary Care Provider: Terressa Koyanagi, DO Primary Cardiologist: Dr. Gwen Her, Minda Ditto)   Patient Profile:   Sue Martin is a 51 y.o. female with a hx of bilateral BKA, CHF EF 35%, CAD, DM, cirrhosis of the liver, CKD, HTN who is being seen today for the evaluation of CHF at the request of Dr. Tarry Kos.  History of Present Illness:   Sue Martin has is s/p bilateral BKA and is wheelchair bound. She is followed for cardiology in [facility] by Dr. Wynonia Hazard. She has a history of PCI of LAD and RCA in the past. At her last appt on 10/19/16 she had no chest pain, BP was controlled and her shortness of breath with exertion was stable.  Sue Martin presented yesterday with complaints of being "yellow" and more shortness of breath than usual. She use oxygen at 2L all the time. Her nephrologist recently decreased her Zaroxolyn from daily to twice a week and she says that she alsays has trouble when it is decreased. She feels that she has more fluid in her skin as it is more firm. She has been seen by GI to evaluate for possible volume increase related to previously diagnosed cirrhosis. They feel that the fluid is mostly subcutaneous/anasarca rather than ascites in the abdomen. They have ordered an ultrasound and if ascites can do a diagnostic tap. She has continued chest pressure "like a fat person sitting on me" since her last hospitalization in August. She has chronic 2 pillow orhopnea, not any worse now. She is complaining of a cough and yesterday she was wheezing with activity.   She was hospitalized 01/22/17-01/30/17 with acute on chronic heart failure. Diuretics were held during the hospitalization for renal worsening. Creatinine improved from 2.1 to 1.8. She was discharged on her usual metolazone and torsemide. She has chest pain during that  admission and a 10 beat run of NSVT, was asymptomatic with. Recommended possible outpatient stress vs cath in the future. Discharge wt 242. She was to follow up with nephrology and cardiology. She saw nephrology recently but has not seen cardiology yet, has appt for 10/18.   Significant findings: BNP  455.6 Troponin  0.04 SCr 1.54, 1.51 K+ 3.0, 2.8 Albumin 3.3,   Total bili 5.2, other LFT's normal CXR:  Cardiomegaly and right-greater-than-left basilar predominant opacities. Pulmonary edema favored, though pneumonia would be difficult to exclude in the appropriate context.   Past Medical History:  Diagnosis Date  . Anxiety   . Arthritis    "throughout my body" (04/06/2017)  . Asthma   . Bipolar disorder (HCC)   . CAD (coronary artery disease) 12/12/2014   -s/p 2V PCI of the LAD/RCA with taxus stents -cardiologist is Dr. Carmon Sails, Celedonio Savage   . CHF (congestive heart failure) (HCC)    sees Dr. Wynonia Hazard  . Chronic back pain    "all over"  . Cirrhosis of liver (HCC)    Hattie Perch 04/06/2017  . CKD (chronic kidney disease), stage III (HCC)    Hattie Perch 04/06/2017  . Depression   . Diabetes mellitus with renal complications (HCC)    PVD and retinopathy, S/p bilat ambutations  . Fibromyalgia   . Glaucoma, bilateral   . Headache    "severely, 2/wk; maybe 2 not as severe" (04/06/2017)  . Heart murmur   . High cholesterol   . History of blood transfusion 2013   "when  I had my legs done"  . Liver cirrhosis (HCC)   . Lyme disease   . On home oxygen therapy    "2L; 24/7" (04/06/2017)  . Retinal detachment    sees Dr. Johna Sheriff per her report    Past Surgical History:  Procedure Laterality Date  . CORONARY ANGIOPLASTY WITH STENT PLACEMENT     "I have 2-3 stents" (04/06/2017)  . ESOPHAGOGASTRODUODENOSCOPY (EGD) WITH PROPOFOL N/A 07/07/2016   Procedure: ESOPHAGOGASTRODUODENOSCOPY (EGD) WITH PROPOFOL;  Surgeon: Rachael Fee, MD;  Location: [facility];  Service: Endoscopy;  Laterality:  N/A;  . LASIK Bilateral   . LEG AMPUTATION BELOW KNEE Bilateral 09/09/2011- 09/11/2011  . RETINAL DETACHMENT SURGERY Right    "tried to fix it; it didn't take"  . TONSILLECTOMY    . TUBAL LIGATION       Home Medications:  Prior to Admission medications   Medication Sig Start Date End Date Taking? Authorizing Provider  aspirin 325 MG tablet Take 325 mg by mouth at bedtime.    Yes [provider]  atorvastatin (LIPITOR) 20 MG tablet Take 1 tablet (20 mg total) by mouth daily. Patient taking differently: Take 20 mg by mouth every evening.  02/11/16  Yes Terressa Koyanagi, DO  cyclobenzaprine (FLEXERIL) 5 MG tablet Take 1 tablet (5 mg total) by mouth 2 (two) times daily as needed for muscle spasms. 11/17/16  Yes Kriste Basque R, DO  hydrALAZINE (APRESOLINE) 10 MG tablet Take 10 mg by mouth 2 (two) times daily.   Yes [provider]  insulin aspart (NOVOLOG) 100 UNIT/ML FlexPen Inject 35 Units into the skin 3 (three) times daily with meals. 11/30/16  Yes Reather Littler, MD  insulin degludec (TRESIBA FLEXTOUCH) 100 UNIT/ML SOPN FlexTouch Pen Inject 0.4 mLs (40 Units total) into the skin daily with breakfast. Patient taking differently: Inject 30 Units into the skin daily with breakfast. 50U IN AM 10/28/16  Yes Reather Littler, MD  isosorbide mononitrate (IMDUR) 30 MG 24 hr tablet Take 0.5 tablets (15 mg total) by mouth daily. 01/31/17  Yes Penny Pia, MD  metoCLOPramide (REGLAN) 5 MG tablet Take 1 tablet (5 mg total) by mouth 3 (three) times daily before meals. 03/27/17  Yes Reather Littler, MD  metolazone (ZAROXOLYN) 5 MG tablet Take 1 tablet (5 mg total) by mouth daily. Patient taking differently: Take 5 mg by mouth 2 (two) times a week. Take Sunday and Thursday 02/01/17  Yes Penny Pia, MD  metoprolol succinate (TOPROL-XL) 25 MG 24 hr tablet Take 1 tablet by mouth daily. 12/30/16  Yes [provider]  OXYGEN 2 L by Continuous infusion (non-IV) route daily.    Yes [provider]    potassium chloride 20 MEQ/15ML (10%) SOLN Take 30 mEq by mouth 2 (two) times daily.  10/29/16  Yes [provider]  torsemide (DEMADEX) 20 MG tablet Take 1 tablet (20 mg total) by mouth 2 (two) times daily. 02/01/17  Yes Penny Pia, MD  metoprolol tartrate (LOPRESSOR) 25 MG tablet Take 0.5 tablets (12.5 mg total) by mouth 2 (two) times daily. Patient not taking: Reported on 04/06/2017 01/30/17   Penny Pia, MD    Inpatient Medications: Scheduled Meds: . aspirin  325 mg Oral QHS  . atorvastatin  20 mg Oral QPM  . furosemide  40 mg Intravenous BID  . hydrALAZINE  10 mg Oral BID  . insulin aspart  35 Units Subcutaneous TID WC  . isosorbide mononitrate  15 mg Oral Daily  . metolazone  5 mg Oral Daily  . metoprolol succinate  25 mg Oral Daily  . potassium chloride  40 mEq Oral BID  . sodium chloride flush  3 mL Intravenous Q12H   Continuous Infusions: . sodium chloride     PRN Meds: sodium chloride, acetaminophen, ondansetron (ZOFRAN) IV, sodium chloride flush  Allergies:    Allergies  Allergen Reactions  . Broccoli [Brassica Oleracea Italica] Anaphylaxis  . Influenza Vaccines Anaphylaxis    Per patient  . Pneumococcal Vaccines Anaphylaxis    Per patient  . Trulicity [Dulaglutide] Hives and Nausea And Vomiting  . Amoxicillin Nausea And Vomiting  . Doxycycline Hives and Nausea And Vomiting  . Iron Other (See Comments)    GI intolerance  . Latex Other (See Comments)    blisters  . Lisinopril Nausea Only  . Penicillins Other (See Comments)    GI intolerance, UTI, can tolerate in IV Tolerates cephalosporins Has patient had a PCN reaction causing immediate rash, facial/tongue/throat swelling, SOB or lightheadedness with hypotension: No Has patient had a PCN reaction causing severe rash involving mucus membranes or skin necrosis: No Has patient had a PCN reaction that required hospitalization: No Has patient had a PCN reaction occurring within the last 10 years: No If  all of the above answers are "NO", then may proceed with Cephalosporin   . Tape Other (See Comments)    Tears skin.  Please use "paper" tape  . Codeine Nausea And Vomiting and Rash  . Sulfa Antibiotics Nausea And Vomiting and Rash  . Sulfur Nausea And Vomiting and Rash    Social History:   Social History   Social History  . Marital status: Married    Spouse name: N/A  . Number of children: N/A  . Years of education: N/A   Occupational History  . Disabled    Social History Main Topics  . Smoking status: Never Smoker  . Smokeless tobacco: Never Used  . Alcohol use 0.0 oz/week     Comment: 04/06/2017 "maybe 4 times/year; holidays; 1 drink at each occasion"  . Drug use: No  . Sexual activity: Not Currently   Other Topics Concern  . Not on file   Social History Narrative   Married   Husband is a Naval architect, he is home 3 days/month   Son and BFF live there and help her.       Family History:    Family History  Problem Relation Age of Onset  . Arthritis Mother   . Heart disease Mother   . Mental illness Mother   . Diabetes Mother   . Arthritis Maternal Grandmother   . Diabetes Maternal Grandmother   . Arthritis Father   . CVA Father   . Hypertension Father   . Sudden death Paternal Grandfather      ROS:  Please see the history of present illness.  ROS  All other ROS reviewed and negative.     Physical Exam/Data:   Vitals:   04/06/17 2235 04/07/17 0019 04/07/17 0530 04/07/17 1100  BP: 116/85 119/62 102/68 (!) 102/55  Pulse: 72 74 72 76  Resp: Temp: 98.5 F (36.9 C) 97.9 F (36.6 C) 97.9 F (36.6 C)   TempSrc: Oral Oral Oral   SpO2: 99% 98% 92%   Weight: 244 lb 4.8 oz (110.8 kg)  244 lb (110.7 kg)   Height:  (1.397 m)       Intake/Output Summary (Last 24 hours) at 04/07/17 1237 Last  data filed at 04/07/17 0900  Gross per 24 hour  Intake              120 ml  Output                0 ml  Net              120 ml   Filed Weights    04/06/17 1725 04/06/17 2235 04/07/17 0530  Weight: 260 lb (117.9 kg) 244 lb 4.8 oz (110.8 kg) 244 lb (110.7 kg)   Body mass index is 56.71 kg/m.  General:  Morbidly obese female, in no acute distress HEENT: mild scleral ichteris. Lymph: no adenopathy Neck: no JVD Endocrine:  No thryomegaly Vascular: No carotid bruits;  Cardiac:  normal S1, S2; RRR; no murmur  Lungs:  clear to auscultation bilaterally, no wheezing, rhonchi or rales  Abd: Large with firm skin/anasarca Ext: Lower extremities with firm edema/anasarca Musculoskeletal:  Bilateral BKA Skin: warm and dry  Neuro:  CNs 2-12 intact, no focal abnormalities noted Psych:  Normal affect   EKG:  The EKG was personally reviewed and demonstrates:  Sinus rhythm with PAC's and PVCs (machine reads afib, but I do not think it is afib) Nonspecific IVCD with LAD Telemetry:  Telemetry was personally reviewed and demonstrates:  NSR in the 70's with occ PVCs  Relevant CV Studies:  Echocardiogram 01/23/17 Study Conclusions  - Left ventricle: The cavity size was normal. Wall thickness was   normal. Systolic function was moderately to severely reduced. The   estimated ejection fraction was in the range of 30% to 35%.   Diffuse hypokinesis. There is akinesis of the apical myocardium.   Doppler parameters are consistent with a reversible restrictive   pattern, indicative of decreased left ventricular diastolic   compliance and/or increased left atrial pressure (grade 3   diastolic dysfunction). - Left atrium: The atrium was mildly dilated. - Pulmonic valve: There was moderate regurgitation.  Laboratory Data:  Chemistry Recent Labs Lab 04/06/17 1810 04/07/17 0520  NA 137 137  K 3.0* 2.8*  CL 87* 87*  CO2 39* 39*  GLUCOSE 153* 119*  BUN 36* 35*  CREATININE 1.54* 1.51*  CALCIUM 9.3 9.3  GFRNONAA 38* 39*  GFRAA 44* 45*  ANIONGAP 11 11     Recent Labs Lab 04/06/17 1810  PROT 7.0  ALBUMIN 3.3*  AST 30  ALT 11*  ALKPHOS  122  BILITOT 5.2*   Hematology Recent Labs Lab 04/06/17 1810  WBC 4.4  RBC 3.96  HGB 12.6  HCT 39.5  MCV 99.7  MCH 31.8  MCHC 31.9  RDW 16.1*  PLT 74*   Cardiac EnzymesNo results for input(s): TROPONINI in the last 168 hours.  Recent Labs Lab 04/06/17 1821  TROPIPOC 0.04    BNP Recent Labs Lab 04/06/17 1810  BNP 455.6*    DDimer No results for input(s): DDIMER in the last 168 hours.  Radiology/Studies:  Dg Chest 2 View  Result Date: 04/06/2017 CLINICAL DATA:  Shortness of breath EXAM: CHEST  2 VIEW COMPARISON:  Chest radiograph 01/27/2017 FINDINGS: Unchanged cardiomegaly. Right greater than left basilar opacities. No pneumothorax or pleural effusion. IMPRESSION: Cardiomegaly and right-greater-than-left basilar predominant opacities. Pulmonary edema favored, though pneumonia would be difficult to exclude in the appropriate context. Electronically Signed   By: Deatra Robinson M.D.   On: 04/06/2017 18:50    Assessment and Plan:   1. 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 EF 30-35% with grade 3 DD in 12/2016. Wt at previous discharge in 12/2016 was 242. Wt 260 on admission. Seen by GI to evaluate for possible ascites related to cirrhosis. Plan for abd [place]. BNP 455.6. CXR with bibasilar predominant opacities favor edema, but could not exclude PNA. No leukocytosis or fever. Currently on lasix 40 mg IV bid and metolazone 5 mg daily. Wt down 260->244. No UOP recorded. Continue current diuresis and strict I&O.  2. CKD:  Baseline cr about 1.3.  Now 1.51. Follow closely while diuresing.   3. CAD: Hx of PCI to LAD and RCA. Treated with aspirin, statin, BB, isosorbide. Continue current therapy.   4. Hypokalemia: Potassium supplementation. Follow closely. At home she takes KCL 30 mEq bid.   5. 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 Was 10.1 in 04/2016.  Management per primary, on home insulin dosing.   6. Peripheral artery disease: s/p bilateral BKA. Now wheelchair bound, She cannot fit into her prostheses.   7. Hyperlipidemia: LDL 92 on 01/23/17. Continue atorvastatin. Would increase dose to better control LDL with CAD and PVD, however, pt has hx of cirrhosis.   For questions or updates, please contact [facility] Please consult [email or web address] for contact info under Cardiology/STEMI.   SignedBerton Bon, NP  04/07/2017 12:37 PM

## 2017-04-07 NOTE — Consult Note (Signed)
   [facility] CM Inpatient Consult   04/07/2017  Chauntel Vanwinkle 05-Feb-1966 [id number]  Patient assessed for HF exacerbation and with 2 admissions in the past 6 months in the [facility]CO. Patient is a bilateral amputee.  Met with patient at the bedside.  Patient's primary care provider is Colin Benton, DO with [employer]. This 65 office is listed to provide the transition of care follow up post hospital.   Patient states she uses [employer] for transportation and she states she has an interest in the [employer] or [employer] exercise program. Inpatient has assigned the patient to HF EMMI calls, provided the patient with information and a [facility] Care Management brochure with contact information.  For questions, please contact:  Natividad Brood, RN BSN [facility] Liaison  [phone] business mobile phone Toll free office [phone]

## 2017-04-07 NOTE — Telephone Encounter (Signed)
Please advise 

## 2017-04-07 NOTE — Progress Notes (Signed)
Patient ID: Sue Martin, female   DOB: 02-10-1966, 51 y.o.   MRN: [id number]  PROGRESS NOTE    Sue Martin  MVH:[id number] DOB: 1965/10/23 DOA: 04/06/2017 PCP: Terressa Koyanagi, DO   Brief Narrative:  51 year female with history of CHF with EF of 35%, coronary disease, diverticulitis, cirrhosis of liver, chronic kidney disease presented with shortness of breath and increasing weight gain. She was admitted with acute on chronic CHF and started on IV Lasix.  Assessment & Plan:   Principal Problem:   Acute on chronic systolic CHF (congestive heart failure) (HCC) Active Problems:   Cirrhosis of liver without ascites (HCC)   S/P bilateral BKA (below knee amputation) (HCC)   CAD (coronary artery disease)   CKD (chronic kidney disease) stage 3, GFR 30-59 ml/min (HCC)   Type II diabetes mellitus with ophthalmic manifestations, uncontrolled (HCC)   Cardiomyopathy, ischemic    Acute on chronic systolic CHF (congestive heart failure) - Echo in July had showed ejection fraction of 30-35% with grade 3 diastolic dysfunction. - Continue Lasix IV along with metolazone. Cardiology consultation  - Strict input and output. Daily weights  - Monitoring renal function closely   Cirrhosis of liver  - GI evaluation. May need ultrasound-guided paracentesis  Coronary artery disease - Stable  Peripheral arterial disease  S/P bilateral BKA (below knee amputation) : stable    CKD (chronic kidney disease) stage 3  - Monitor creatinine. Outpatient follow-up with nephrology    Type II diabetes mellitus uncontrolled- continue short-acting and long-acting insulin along with sliding scale insulin  Hypokalemia - replace and repeat a.m. Labs  Thrombocytopenia - probably from Cirrhosis - monitor    DVT prophylaxis: SCDs Code Status:  Full Family Communication: None at bedside Disposition Plan: Home in 2-3 days  Consultants: Cardiology and GI  Procedures: None  Antimicrobials: None     Subjective: Patient seen and examined at bedside. She denies any current fever, nausea or vomiting.  Objective: Vitals:   04/07/17 0019 04/07/17 0530 04/07/17 1100 04/07/17 1259  BP: 119/62 102/68 (!) 102/55 (!) 110/50  Pulse: 74 72 76 77  Resp: Temp: 97.9 F (36.6 C) 97.9 F (36.6 C)  98.4 F (36.9 C)  TempSrc: Oral Oral  Oral  SpO2: 98% 92%  90%  Weight:  110.7 kg (244 lb)    Height:        Intake/Output Summary (Last 24 hours) at 04/07/17 1303 Last data filed at 04/07/17 0900  Gross per 24 hour  Intake              120 ml  Output                0 ml  Net              120 ml   Filed Weights   04/06/17 1725 04/06/17 2235 04/07/17 0530  Weight: 117.9 kg (260 lb) 110.8 kg (244 lb 4.8 oz) 110.7 kg (244 lb)    Examination:  General exam: Appears calm and comfortable  Respiratory system: Bilateral decreased breath sound at bases With basilar crackles Cardiovascular system: S1 & S2 heard, rate controlled Gastrointestinal system: Abdomen is distended,and nontender. Normal bowel sounds heard. Moderate pannus edema Extremities: No cyanosis, clubbing; bilateral BKA   Data Reviewed: I have personally reviewed following labs and imaging studies  CBC:  Recent Labs Lab 04/06/17 1810  WBC 4.4  NEUTROABS 3.5  HGB 12.6  HCT 39.5  MCV 99.7  PLT 74*   Basic Metabolic Panel:  Recent Labs Lab 04/06/17 1810 04/07/17 0520  NA 137 137  K 3.0* 2.8*  CL 87* 87*  CO2 39* 39*  GLUCOSE 153* 119*  BUN 36* 35*  CREATININE 1.54* 1.51*  CALCIUM 9.3 9.3   GFR: Estimated Creatinine Clearance: 45.5 mL/min (A) (by C-G formula based on SCr of 1.51 mg/dL (H)). Liver Function Tests:  Recent Labs Lab 04/06/17 1810  AST 30  ALT 11*  ALKPHOS 122  BILITOT 5.2*  PROT 7.0  ALBUMIN 3.3*    Recent Labs Lab 04/06/17 1810  LIPASE 30   No results for input(s): AMMONIA in the last 168 hours. Coagulation Profile:  Recent Labs Lab 04/06/17 1927  INR 1.20    Cardiac Enzymes: No results for input(s): CKTOTAL, CKMB, CKMBINDEX, TROPONINI in the last 168 hours. BNP (last 3 results) No results for input(s): PROBNP in the last 8760 hours. HbA1C: No results for input(s): HGBA1C in the last 72 hours. CBG:  Recent Labs Lab 04/07/17 0722 04/07/17 1201  GLUCAP 118* 203*   Lipid Profile: No results for input(s): CHOL, HDL, LDLCALC, TRIG, CHOLHDL, LDLDIRECT in the last 72 hours. Thyroid Function Tests: No results for input(s): TSH, T4TOTAL, FREET4, T3FREE, THYROIDAB in the last 72 hours. Anemia Panel: No results for input(s): VITAMINB12, FOLATE, FERRITIN, TIBC, IRON, RETICCTPCT in the last 72 hours. Sepsis Labs: No results for input(s): PROCALCITON, LATICACIDVEN in the last 168 hours.  No results found for this or any previous visit (from the past 240 hour(s)).       Radiology Studies: Dg Chest 2 View  Result Date: 04/06/2017 CLINICAL DATA:  Shortness of breath EXAM: CHEST  2 VIEW COMPARISON:  Chest radiograph 01/27/2017 FINDINGS: Unchanged cardiomegaly. Right greater than left basilar opacities. No pneumothorax or pleural effusion. IMPRESSION: Cardiomegaly and right-greater-than-left basilar predominant opacities. Pulmonary edema favored, though pneumonia would be difficult to exclude in the appropriate context. Electronically Signed   By: Deatra Robinson M.D.   On: 04/06/2017 18:50        Scheduled Meds: . aspirin  325 mg Oral QHS  . atorvastatin  20 mg Oral QPM  . furosemide  40 mg Intravenous BID  . hydrALAZINE  10 mg Oral BID  . insulin aspart  35 Units Subcutaneous TID WC  . isosorbide mononitrate  15 mg Oral Daily  . metolazone  5 mg Oral Daily  . metoprolol succinate  25 mg Oral Daily  . potassium chloride  40 mEq Oral BID  . sodium chloride flush  3 mL Intravenous Q12H   Continuous Infusions: . sodium chloride       LOS: 1 day        Glade Lloyd, MD [facility] Pager [phone]  If 7PM-7AM,  please contact night-coverage [email or web address] Password [email or web address] 04/07/2017, 1:03 PM

## 2017-04-07 NOTE — Care Management Note (Signed)
Case Management Note  Patient Details  Name: Sue Martin MRN: [id number] Date of Birth: 06-09-1966  Subjective/Objective:     CHF              Action/Plan: Patient lives at home with her son; PCP: Lucretia Kern, DO; has private insurance with [facility] Medicare with prescription drug coverage; bilateral amputee, wheel chair and home oxygen at home; CM following for DCP; [name]  Action/Plan: 01/30/17 Met with pt to discuss d/c needs. [name] with oxygen. Pt has no other DME needs. This CM explored a possible wider tub bench however this is not available locally. Pt will search online for this. [name] via [facility] with oxygen. [name] RN CM   Expected Discharge Date:    possibly 04/10/2017              Expected Discharge Plan:  Home/Self Care  In-House Referral:   [facility]  Discharge planning Services  CM Consult  Status of Service:  In process, will continue to follow  Sherrilyn Rist [phone] 04/07/2017, 12:31 PM

## 2017-04-07 NOTE — Consult Note (Signed)
Referring Provider: Dr. Hanley Ben Primary Care Physician:  Terressa Koyanagi, DO Primary Gastroenterologist:  Dr. Christella Hartigan  Reason for Consultation:  Fluid management in cirrhotic, elevated bili  HPI: Sue Martin is a 51 y.o. female recently established with Dr. Christella Hartigan who was last seen in our office in August 2018 for cirrhosis follow-up. Patient has multiple serious medical issues and was just discharged from the hospital on 01/30/2013 after admission for acute congestive heart failure and acute kidney injury. Now requires O2 ATC at 2 Liters.  She has EF of about 35%, history of coronary artery disease, peripheral vascular disease, ischemic cardiomyopathy, adult-onset diabetes mellitus, and chronic kidney disease stage III. She is status post bilateral BKA's.  Here now with fluid overload.  Diuretics being managed by renal/cards.  Xaroxolyn decreased recently.  Also on torsemide at home.  Has noticed increasing SOB and fluid in abdomen.  Family noticed eyes yellowing over the past couple of days.  Total bili 5.2.  Other LFT's normal.  INR stable.  Platelets normal.  EGD per Dr. Christella Hartigan on 07/07/2016 showed no evidence of esophageal varices.  Last upper abdominal ultrasound November 2017 with elastography did show cholelithiasis and a nodular liver consistent with cirrhosis with fibrosis score F3-F4. Hepatitis serologies were negative, autoimmune markers revealed a weakly positive ANA and slightly elevated anti-smooth muscle antibody, not felt clinically significant.   Past Medical History:  Diagnosis Date  . Anxiety   . Arthritis    "throughout my body" (04/06/2017)  . Asthma   . Bipolar disorder (HCC)   . CAD (coronary artery disease) 12/12/2014   -s/p 2V PCI of the LAD/RCA with taxus stents -cardiologist is Dr. Carmon Sails, [facility]   . CHF (congestive heart failure) (HCC)    sees Dr. Wynonia Hazard  . Chronic back pain    "all over"  . Cirrhosis of liver (HCC)    Hattie Perch 04/06/2017  . CKD (chronic  kidney disease), stage III (HCC)    Hattie Perch 04/06/2017  . Depression   . Diabetes mellitus with renal complications (HCC)    PVD and retinopathy, S/p bilat ambutations  . Fibromyalgia   . Glaucoma, bilateral   . Headache    "severely, 2/wk; maybe 2 not as severe" (04/06/2017)  . Heart murmur   . High cholesterol   . History of blood transfusion 2013   "when I had my legs done"  . Liver cirrhosis (HCC)   . Lyme disease   . On home oxygen therapy    "2L; 24/7" (04/06/2017)  . Retinal detachment    sees Dr. Johna Sheriff per her report    Past Surgical History:  Procedure Laterality Date  . CORONARY ANGIOPLASTY WITH STENT PLACEMENT     "I have 2-3 stents" (04/06/2017)  . ESOPHAGOGASTRODUODENOSCOPY (EGD) WITH PROPOFOL N/A 07/07/2016   Procedure: ESOPHAGOGASTRODUODENOSCOPY (EGD) WITH PROPOFOL;  Surgeon: Rachael Fee, MD;  Location: [facility];  Service: Endoscopy;  Laterality: N/A;  . LASIK Bilateral   . LEG AMPUTATION BELOW KNEE Bilateral 09/09/2011- 09/11/2011  . RETINAL DETACHMENT SURGERY Right    "tried to fix it; it didn't take"  . TONSILLECTOMY    . TUBAL LIGATION      Prior to Admission medications   Medication Sig Start Date End Date Taking? Authorizing Provider  aspirin 325 MG tablet Take 325 mg by mouth at bedtime.    Yes [provider]  atorvastatin (LIPITOR) 20 MG tablet Take 1 tablet (20 mg total) by mouth daily. Patient taking differently: Take 20  mg by mouth every evening.  02/11/16  Yes Terressa Koyanagi, DO  cyclobenzaprine (FLEXERIL) 5 MG tablet Take 1 tablet (5 mg total) by mouth 2 (two) times daily as needed for muscle spasms. 11/17/16  Yes Kriste Basque R, DO  hydrALAZINE (APRESOLINE) 10 MG tablet Take 10 mg by mouth 2 (two) times daily.   Yes [provider]  insulin aspart (NOVOLOG) 100 UNIT/ML FlexPen Inject 35 Units into the skin 3 (three) times daily with meals. 11/30/16  Yes Reather Littler, MD  insulin degludec (TRESIBA FLEXTOUCH) 100 UNIT/ML  SOPN FlexTouch Pen Inject 0.4 mLs (40 Units total) into the skin daily with breakfast. Patient taking differently: Inject 30 Units into the skin daily with breakfast. 50U IN AM 10/28/16  Yes Reather Littler, MD  isosorbide mononitrate (IMDUR) 30 MG 24 hr tablet Take 0.5 tablets (15 mg total) by mouth daily. 01/31/17  Yes Penny Pia, MD  metoCLOPramide (REGLAN) 5 MG tablet Take 1 tablet (5 mg total) by mouth 3 (three) times daily before meals. 03/27/17  Yes Reather Littler, MD  metolazone (ZAROXOLYN) 5 MG tablet Take 1 tablet (5 mg total) by mouth daily. Patient taking differently: Take 5 mg by mouth 2 (two) times a week. Take Sunday and Thursday 02/01/17  Yes Penny Pia, MD  metoprolol succinate (TOPROL-XL) 25 MG 24 hr tablet Take 1 tablet by mouth daily. 12/30/16  Yes [provider]  OXYGEN 2 L by Continuous infusion (non-IV) route daily.    Yes [provider]  potassium chloride 20 MEQ/15ML (10%) SOLN Take 30 mEq by mouth 2 (two) times daily.  10/29/16  Yes [provider]  torsemide (DEMADEX) 20 MG tablet Take 1 tablet (20 mg total) by mouth 2 (two) times daily. 02/01/17  Yes Penny Pia, MD  metoprolol tartrate (LOPRESSOR) 25 MG tablet Take 0.5 tablets (12.5 mg total) by mouth 2 (two) times daily. Patient not taking: Reported on 04/06/2017 01/30/17   Penny Pia, MD    Current Facility-Administered Medications  Medication Dose Route Frequency Provider Last Rate Last Dose  . 0.9 %  sodium chloride infusion  250 mL Intravenous PRN Haydee Monica, MD      . acetaminophen (TYLENOL) tablet 650 mg  650 mg Oral Q4H PRN Haydee Monica, MD      . aspirin tablet 325 mg  325 mg Oral QHS Haydee Monica, MD   325 mg at 04/07/17 0129  . atorvastatin (LIPITOR) tablet 20 mg  20 mg Oral QPM Tarry Kos A, MD      . furosemide (LASIX) injection 40 mg  40 mg Intravenous BID Tarry Kos A, MD      . hydrALAZINE (APRESOLINE) tablet 10 mg  10 mg Oral BID Tarry Kos A, MD   10 mg at  04/07/17 0130  . insulin aspart (novoLOG) injection 35 Units  35 Units Subcutaneous TID WC Tarry Kos A, MD      . isosorbide mononitrate (IMDUR) 24 hr tablet 15 mg  15 mg Oral Daily Tarry Kos A, MD      . metolazone (ZAROXOLYN) tablet 5 mg  5 mg Oral Daily Tarry Kos A, MD   5 mg at 04/07/17 0130  . metoprolol succinate (TOPROL-XL) 24 hr tablet 25 mg  25 mg Oral Daily Tarry Kos A, MD      . ondansetron (ZOFRAN) injection 4 mg  4 mg Intravenous Q6H PRN Tarry Kos A, MD      . potassium chloride SA (K-DUR,KLOR-CON) CR  tablet 40 mEq  40 mEq Oral BID Alekh, Kshitiz, MD      . sodium chloride flush (NS) 0.9 % injection 3 mL  3 mL Intravenous Q12H Tarry Kos A, MD   3 mL at 04/07/17 0131  . sodium chloride flush (NS) 0.9 % injection 3 mL  3 mL Intravenous PRN Haydee Monica, MD        Allergies as of 04/06/2017 - Review Complete 04/06/2017  Allergen Reaction Noted  . Broccoli [brassica oleracea italica] Anaphylaxis 11/28/2014  . Influenza vaccines Anaphylaxis 04/01/2015  . Pneumococcal vaccines Anaphylaxis 04/01/2015  . Trulicity [dulaglutide] Hives and Nausea And Vomiting 02/21/2015  . Amoxicillin Nausea And Vomiting   . Doxycycline Hives and Nausea And Vomiting 12/13/2016  . Iron Other (See Comments) 11/28/2014  . Latex Other (See Comments) 11/28/2014  . Lisinopril Nausea Only 02/21/2015  . Penicillins Other (See Comments) 11/28/2014  . Tape Other (See Comments) 02/21/2015  . Codeine Nausea And Vomiting and Rash 11/28/2014  . Sulfa antibiotics Nausea And Vomiting and Rash 11/28/2014  . Sulfur Nausea And Vomiting and Rash 02/21/2015    Family History  Problem Relation Age of Onset  . Arthritis Mother   . Heart disease Mother   . Mental illness Mother   . Diabetes Mother   . Arthritis Maternal Grandmother   . Diabetes Maternal Grandmother   . Arthritis Father   . CVA Father   . Hypertension Father   . Sudden death Paternal Grandfather     Social History    Social History  . Marital status: Married    Spouse name: N/A  . Number of children: N/A  . Years of education: N/A   Occupational History  . Disabled    Social History Main Topics  . Smoking status: Never Smoker  . Smokeless tobacco: Never Used  . Alcohol use 0.0 oz/week     Comment: 04/06/2017 "maybe 4 times/year; holidays; 1 drink at each occasion"  . Drug use: No  . Sexual activity: Not Currently   Other Topics Concern  . Not on file   Social History Narrative   Married   Husband is a Naval architect, he is home 3 days/month   Son and BFF live there and help her.       Review of Systems: ROS is O/W negative except as mentioned in HPI.  Physical Exam: Vital signs in last 24 hours: Temp:  [97.9 F (36.6 C)-98.5 F (36.9 C)] 97.9 F (36.6 C) (10/12 0530) Pulse Rate:  [66-81] 72 (10/12 0530) Resp:  [11-22] 20 (10/12 0530) BP: (102-124)/(62-85) 102/68 (10/12 0530) SpO2:  [92 %-100 %] 92 % (10/12 0530) Weight:  [244 lb (110.7 kg)-260 lb (117.9 kg)] 244 lb (110.7 kg) (10/12 0530) Last BM Date: 04/06/17 General:  Alert, Well-developed, well-nourished, pleasant and cooperative in NAD Head:  Normocephalic and atraumatic. Eyes:  Sclera clear, no icterus.  Conjunctiva pink. Ears:  Normal auditory acuity. Mouth:  No deformity or lesions.   Lungs:  Clear throughout to auscultation.  No wheezes, crackles, or rhonchi.  Heart:  Regular rate and rhythm; no murmurs, clicks, rubs, or gallops. Abdomen:  Hard with what seems to be more subcutaneous fluid rather than ascites.  BS present.  Mild diffuse TTP. Rectal:  Deferred  Msk:  Symmetrical without gross deformities. Pulses:  Normal pulses noted. Extremities:  B/L BKA. Neurologic:  Alert and oriented x 4;  grossly normal neurologically. Skin:  Intact without significant lesions or rashes. Psych:  Alert and  cooperative. Normal mood and affect.  Intake/Output from previous day: No intake/output data recorded. Intake/Output  this shift: Total I/O In: 120 [P.O.:120] Out: -   Lab Results:  Recent Labs  04/06/17 1810  WBC 4.4  HGB 12.6  HCT 39.5  PLT 74*   BMET  Recent Labs  04/06/17 1810 04/07/17 0520  NA 137 137  K 3.0* 2.8*  CL 87* 87*  CO2 39* 39*  GLUCOSE 153* 119*  BUN 36* 35*  CREATININE 1.54* 1.51*  CALCIUM 9.3 9.3   LFT  Recent Labs  04/06/17 1810  PROT 7.0  ALBUMIN 3.3*  AST 30  ALT 11*  ALKPHOS 122  BILITOT 5.2*   PT/INR  Recent Labs  04/06/17 1927  LABPROT 15.2  INR 1.20   Studies/Results: Dg Chest 2 View  Result Date: 04/06/2017 CLINICAL DATA:  Shortness of breath EXAM: CHEST  2 VIEW COMPARISON:  Chest radiograph 01/27/2017 FINDINGS: Unchanged cardiomegaly. Right greater than left basilar opacities. No pneumothorax or pleural effusion. IMPRESSION: Cardiomegaly and right-greater-than-left basilar predominant opacities. Pulmonary edema favored, though pneumonia would be difficult to exclude in the appropriate context. Electronically Signed   By: Deatra Robinson M.D.   On: 04/06/2017 18:50    IMPRESSION:  #55 51 year old female with multiple serious comorbidities, diagnosed with compensated cirrhosis, most consistent with NASH induced.  No varices noted on EGD January 2018, she has not had ascites in the past.  Here now with increasing fluid overload, which seems to be mostly subcutaneous/anasarca rather than ascites in the abdomen.  Total bili up slightly but all other hepatic parameters stable. #2 congestive heart failure with EF 35%-recent admission with CHF, now requiring oxygen at 2Liters #3 ischemic cardiomyopathy #4 coronary artery disease #5 peripheral vascular disease #6 adult-onset diabetes mellitus #7 status post bilateral BKA's  #8 chronic kidney disease stage III #9 Hypokalemia:  Replacement per primary service.  PLAN: -Fluid management per cardiology and renal. -Will check an abdominal ultrasound.  If there is ascites then can have a diagnostic tap  to rule out SBP.  ZEHR, JESSICA D.  04/07/2017, 9:41 AM  Pager number [phone]    Attending physician's note   I have taken a history, examined the patient and reviewed the chart. I agree with the Advanced Practitioner's note, impression and recommendations.  51 yr F compensated cirrhosis, MELD 19 , severe CHF and renal dysfunction admitted with volume overload Based on exam abdomen is distended with subcutaneous edema and is firm Follow up abdominal ultrasound, diagnostic paracentesis if has any ascited (send fluid for ascitic fluid, total protein and cell count) If patient has high SAAG >1.1 and total protein >2.5gm likely cardiac ascites rather than from portal hypertension and cirrhosis Fluid management and diuresis as per cardiology Please call with any questions  K Scherry Ran, MD [phone] Mon-Fri 8a-5p [phone] after 5p, weekends, holidays

## 2017-04-07 NOTE — Telephone Encounter (Signed)
Pt is taking tresiba, antinausea med, and novolog per our regimen, she is curently in the hospital and is not being given any insulin

## 2017-04-08 DIAGNOSIS — N170 Acute kidney failure with tubular necrosis: Secondary | ICD-10-CM

## 2017-04-08 DIAGNOSIS — Z89511 Acquired absence of right leg below knee: Secondary | ICD-10-CM

## 2017-04-08 DIAGNOSIS — N184 Chronic kidney disease, stage 4 (severe): Secondary | ICD-10-CM

## 2017-04-08 DIAGNOSIS — J810 Acute pulmonary edema: Secondary | ICD-10-CM

## 2017-04-08 DIAGNOSIS — Z89512 Acquired absence of left leg below knee: Secondary | ICD-10-CM

## 2017-04-08 DIAGNOSIS — R17 Unspecified jaundice: Secondary | ICD-10-CM

## 2017-04-08 DIAGNOSIS — N183 Chronic kidney disease, stage 3 (moderate): Secondary | ICD-10-CM

## 2017-04-08 LAB — CBC WITH DIFFERENTIAL/PLATELET
Basophils Absolute: 0 10*3/uL (ref 0.0–0.1)
Basophils Relative: 0 %
EOS PCT: 3 %
Eosinophils Absolute: 0.1 10*3/uL (ref 0.0–0.7)
HEMATOCRIT: 35.6 % — AB (ref 36.0–46.0)
Hemoglobin: 11.1 g/dL — ABNORMAL LOW (ref 12.0–15.0)
LYMPHS PCT: 11 %
Lymphs Abs: 0.5 10*3/uL — ABNORMAL LOW (ref 0.7–4.0)
MCH: 31.4 pg (ref 26.0–34.0)
MCHC: 31.2 g/dL (ref 30.0–36.0)
MCV: 100.6 fL — AB (ref 78.0–100.0)
MONO ABS: 0.6 10*3/uL (ref 0.1–1.0)
MONOS PCT: 12 %
NEUTROS ABS: 3.7 10*3/uL (ref 1.7–7.7)
Neutrophils Relative %: 74 %
PLATELETS: 77 10*3/uL — AB (ref 150–400)
RBC: 3.54 MIL/uL — ABNORMAL LOW (ref 3.87–5.11)
RDW: 16.1 % — AB (ref 11.5–15.5)
WBC: 5 10*3/uL (ref 4.0–10.5)

## 2017-04-08 LAB — BASIC METABOLIC PANEL
ANION GAP: 13 (ref 5–15)
BUN: 42 mg/dL — AB (ref 6–20)
CALCIUM: 9 mg/dL (ref 8.9–10.3)
CO2: 35 mmol/L — AB (ref 22–32)
Chloride: 90 mmol/L — ABNORMAL LOW (ref 101–111)
Creatinine, Ser: 2.04 mg/dL — ABNORMAL HIGH (ref 0.44–1.00)
GFR calc Af Amer: 32 mL/min — ABNORMAL LOW (ref >60)
GFR calc non Af Amer: 27 mL/min — ABNORMAL LOW (ref >60)
GLUCOSE: 151 mg/dL — AB (ref 65–99)
POTASSIUM: 3.7 mmol/L (ref 3.5–5.1)
Sodium: 138 mmol/L (ref 135–145)

## 2017-04-08 LAB — GLUCOSE, CAPILLARY
GLUCOSE-CAPILLARY: 168 mg/dL — AB (ref 65–99)
Glucose-Capillary: 133 mg/dL — ABNORMAL HIGH (ref 65–99)
Glucose-Capillary: 213 mg/dL — ABNORMAL HIGH (ref 65–99)
Glucose-Capillary: 153 mg/dL — ABNORMAL HIGH (ref 65–99)

## 2017-04-08 LAB — MAGNESIUM: Magnesium: 2.2 mg/dL (ref 1.7–2.4)

## 2017-04-08 NOTE — Progress Notes (Signed)
IV team called and stated they cannot place PICC line due to pt history. IV team stated they need nephrology to sign off. Paged cardiology to inform  Awaiting call back

## 2017-04-08 NOTE — Progress Notes (Signed)
PICC line d/w pt for approximately 30 minutes.  Pt verbalizes understanding, consent signed,  Pt and RN notified PICC to be done 04-09-17 AM.

## 2017-04-08 NOTE — Progress Notes (Signed)
   Called Dr. Arlean Hopping with Nephrology.  We discussed the patient's case and recent creatinine. He agrees the patient is ok to proceed with PICC line placement.  [name], PA-C    04/08/2017 3:39 PM

## 2017-04-08 NOTE — Progress Notes (Signed)
Pt requests all four bed rails up to assist with movement in bed

## 2017-04-08 NOTE — Progress Notes (Addendum)
Progress Note  Patient Name: Sue Martin Date of Encounter: 04/08/2017 Primary Cardiologist: Dr. Gwen Her, [facility])  Subjective   She still feels SOB.  Inpatient Medications    Scheduled Meds: . aspirin  325 mg Oral QHS  . atorvastatin  20 mg Oral QPM  . furosemide  40 mg Intravenous BID  . hydrALAZINE  10 mg Oral BID  . insulin aspart  35 Units Subcutaneous TID WC  . insulin glargine  50 Units Subcutaneous Daily  . isosorbide mononitrate  15 mg Oral Daily  . metoCLOPramide  5 mg Oral TID AC  . metolazone  5 mg Oral Daily  . metoprolol succinate  25 mg Oral Daily  . potassium chloride  40 mEq Oral BID  . sodium chloride flush  3 mL Intravenous Q12H   Continuous Infusions: . sodium chloride     PRN Meds: sodium chloride, acetaminophen, ondansetron (ZOFRAN) IV, sodium chloride flush   Vital Signs    Vitals:   04/07/17 2133 04/08/17 0529 04/08/17 0807 04/08/17 0935  BP: (!) 93/46 (!) 100/58 (!) 104/56 (!) 94/45  Pulse: 67 (!) 59 60 62  Resp: Temp: 98.2 F (36.8 C) 97.9 F (36.6 C)    TempSrc: Oral Oral    SpO2: 90% 96% 98%   Weight:  246 lb 1.6 oz (111.6 kg)    Height:        Intake/Output Summary (Last 24 hours) at 04/08/17 1013 Last data filed at 04/08/17 0600  Gross per 24 hour  Intake              360 ml  Output                0 ml  Net              360 ml   Filed Weights   04/06/17 2235 04/07/17 0530 04/08/17 0529  Weight: 244 lb 4.8 oz (110.8 kg) 244 lb (110.7 kg) 246 lb 1.6 oz (111.6 kg)    Telemetry    A-fib, rate controlled - Personally Reviewed  Physical Exam   GEN: No acute distress.  Morbidly obese Neck: No JVD Cardiac:i RRR, no murmurs, rubs, or gallops.  Respiratory: crackles at bases bilaterally. GI: Soft, nontender, non-distended  MS: B/L LE amputation Neuro:  Nonfocal  Psych: Normal affect   Labs    Chemistry Recent Labs Lab 04/06/17 1810 04/07/17 0520 04/08/17 0534  NA 137 137 138  K  3.0* 2.8* 3.7  CL 87* 87* 90*  CO2 39* 39* 35*  GLUCOSE 153* 119* 151*  BUN 36* 35* 42*  CREATININE 1.54* 1.51* 2.04*  CALCIUM 9.3 9.3 9.0  PROT 7.0  --   --   ALBUMIN 3.3*  --   --   AST 30  --   --   ALT 11*  --   --   ALKPHOS 122  --   --   BILITOT 5.2*  --   --   GFRNONAA 38* 39* 27*  GFRAA 44* 45* 32*  ANIONGAP Hematology Recent Labs Lab 04/06/17 1810 04/08/17 0534  WBC 4.4 5.0  RBC 3.96 3.54*  HGB 12.6 11.1*  HCT 39.5 35.6*  MCV 99.7 100.6*  MCH 31.8 31.4  MCHC 31.9 31.2  RDW 16.1* 16.1*  PLT 74* 77*    Cardiac EnzymesNo results for input(s): TROPONINI in the last 168 hours.  Recent Labs Lab 04/06/17 1821  TROPIPOC 0.04     BNP Recent Labs Lab 04/06/17 1810  BNP 455.6*     DDimer No results for input(s): DDIMER in the last 168 hours.   Radiology    Dg Chest 2 View  Result Date: 04/06/2017 CLINICAL DATA:  Shortness of breath EXAM: CHEST  2 VIEW COMPARISON:  Chest radiograph 01/27/2017 FINDINGS: Unchanged cardiomegaly. Right greater than left basilar opacities. No pneumothorax or pleural effusion. IMPRESSION: Cardiomegaly and right-greater-than-left basilar predominant opacities. Pulmonary edema favored, though pneumonia would be difficult to exclude in the appropriate context. Electronically Signed   By: Deatra Robinson M.D.   On: 04/06/2017 18:50   US Abdomen Complete  Result Date: 04/07/2017 CLINICAL DATA:  Initial evaluation for elevated bilirubin, cirrhosis. EXAM: ABDOMEN ULTRASOUND COMPLETE COMPARISON:  Prior ultrasound from 07/06/2015. FINDINGS: Gallbladder: Echogenic calculi measuring up to 6 mm present within the gallbladder. Gallbladder wall measure within normal limits at 2.5 mm. No sonographic Murphy sign elicited on exam. No free pericholecystic fluid. Common bile duct: Diameter: 3 mm Liver: Liver demonstrates a coarse heterogeneous echotexture with nodular contour, consistent with cirrhosis. No discrete intrahepatic mass. Portal  vein is patent on color Doppler imaging with normal direction of blood flow towards the liver. IVC: No abnormality visualized. Pancreas: Visualized portion unremarkable. Spleen: Spleen enlarged measuring 16 cm in length. Right Kidney: Length: 11.8 cm. Cortical thinning with normal parenchymal echogenicity. No mass or hydronephrosis visualized. Left Kidney: Length: 10.6 cm. Cortical thinning with normal parenchymal echogenicity. 6 mm shadowing echogenic calculus present within the lower pole. No mass lesion or hydronephrosis. Abdominal aorta: No aneurysm visualized. Other findings: Trace ascites present around the liver, with additional studies within the bilateral lower quadrant. IMPRESSION: 1. Hepatic cirrhosis with small volume ascites. No discrete mass lesion identified. 2. Mild splenomegaly, suggesting underlying portal hypertension. 3. Cholelithiasis. No sonographic features to suggest acute cholecystitis. 4. Nonobstructive bilateral nephrolithiasis as above. Electronically Signed   By: Rise Mu M.D.   On: 04/07/2017 22:47    Cardiac Studies     Patient Profile     51 y.o. female   Assessment & Plan    1. 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 EF 30-35% with grade 3 DD in 12/2016. Wt at previous discharge in 12/2016 was 242. Wt 260 on admission. Seen by GI to evaluate for possible ascites related to cirrhosis. Plan for abd [place]. BNP 455.6. CXR with bibasilar predominant opacities favor edema, but could not exclude PNA. No leukocytosis or fever. Currently on lasix 40 mg IV bid and metolazone 5 mg daily. Wt down 260->244.  Still appears fluid overloaded but hard to estimate given her obesity. I would order a picc line and obtain Co-ox, crea is uptrending, I will d/c metolazone. D/C imdur, hydralazine given hypotension.   2. CKD:  Baseline cr about 1.3.  Now 2.0. Follow  closely while diuresing. D/C metolazone  3. CAD: Hx of PCI to LAD and RCA. Treated with aspirin, statin, BB, isosorbide. Continue current therapy.   4. Hypokalemia: Potassium supplementation. Follow closely. At home she takes KCL 30 mEq bid.   5. 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 Was 10.1 in 04/2016. Management per primary, on home insulin dosing.   6. Peripheral artery disease: s/p bilateral BKA. Now wheelchair bound, She cannot fit into her prostheses.   7. Hyperlipidemia: LDL 92 on 01/23/17. Continue atorvastatin. Would increase dose  to better control LDL with CAD and PVD, however, pt has hx of cirrhosis.     For questions or updates, please contact [facility] Please consult [email or web address] for contact info under Cardiology/STEMI.      Signed, Tobias Alexander, MD  04/08/2017, 10:13 AM

## 2017-04-08 NOTE — Progress Notes (Signed)
CROSS COVER FOR [facility] Subjective: Patient denies having any acute GI issues today. Abdominal ultrasound did not reveal a lot of ascites. She was noted to have cholelithiasis. Her weight is down to 244 from 260 pounds on admission.  Objective: Vital signs in last 24 hours: Temp:  [97.9 F (36.6 C)-98.4 F (36.9 C)] 97.9 F (36.6 C) (10/13 0529) Pulse Rate:  [59-77] 59 (10/13 0529) Resp:  [18] 18 (10/13 0529) BP: (93-110)/(46-58) 100/58 (10/13 0529) SpO2:  [90 %-96 %] 96 % (10/13 0529) Weight:  [111.6 kg (246 lb 1.6 oz)] 111.6 kg (246 lb 1.6 oz) (10/13 0529) Last BM Date: 04/06/17  Intake/Output from previous day: 10/12 0701 - 10/13 0700 In: 480 [P.O.:480] Out: -  Intake/Output this shift: No intake/output data recorded.  Morbidly obese white female in NAD Chest: CTA S1& S2 regualr. Abdomen morbidly obese with NABS. Bilatera BKA's . Lab Results:  Recent Labs  04/06/17 1810 04/08/17 0534  WBC 4.4 5.0  HGB 12.6 11.1*  HCT 39.5 35.6*  PLT 74* 77*   BMET  Recent Labs  04/06/17 1810 04/07/17 0520 04/08/17 0534  NA 137 137 138  K 3.0* 2.8* 3.7  CL 87* 87* 90*  CO2 39* 39* 35*  GLUCOSE 153* 119* 151*  BUN 36* 35* 42*  CREATININE 1.54* 1.51* 2.04*  CALCIUM 9.3 9.3 9.0   LFT  Recent Labs  04/06/17 1810  PROT 7.0  ALBUMIN 3.3*  AST 30  ALT 11*  ALKPHOS 122  BILITOT 5.2*   PT/INR  Recent Labs  04/06/17 1927  LABPROT 15.2  INR 1.20   Studies/Results: Dg Chest 2 View  Result Date: 04/06/2017 CLINICAL DATA:  Shortness of breath EXAM: CHEST  2 VIEW COMPARISON:  Chest radiograph 01/27/2017 FINDINGS: Unchanged cardiomegaly. Right greater than left basilar opacities. No pneumothorax or pleural effusion. IMPRESSION: Cardiomegaly and right-greater-than-left basilar predominant opacities. Pulmonary edema favored, though pneumonia would be difficult to exclude in the appropriate context. Electronically Signed   By: Deatra Robinson M.D.   On: 04/06/2017 18:50    US Abdomen Complete  Result Date: 04/07/2017 CLINICAL DATA:  Initial evaluation for elevated bilirubin, cirrhosis. EXAM: ABDOMEN ULTRASOUND COMPLETE COMPARISON:  Prior ultrasound from 07/06/2015. FINDINGS: Gallbladder: Echogenic calculi measuring up to 6 mm present within the gallbladder. Gallbladder wall measure within normal limits at 2.5 mm. No sonographic Murphy sign elicited on exam. No free pericholecystic fluid. Common bile duct: Diameter: 3 mm Liver: Liver demonstrates a coarse heterogeneous echotexture with nodular contour, consistent with cirrhosis. No discrete intrahepatic mass. Portal vein is patent on color Doppler imaging with normal direction of blood flow towards the liver. IVC: No abnormality visualized. Pancreas: Visualized portion unremarkable. Spleen: Spleen enlarged measuring 16 cm in length. Right Kidney: Length: 11.8 cm. Cortical thinning with normal parenchymal echogenicity. No mass or hydronephrosis visualized. Left Kidney: Length: 10.6 cm. Cortical thinning with normal parenchymal echogenicity. 6 mm shadowing echogenic calculus present within the lower pole. No mass lesion or hydronephrosis. Abdominal aorta: No aneurysm visualized. Other findings: Trace ascites present around the liver, with additional studies within the bilateral lower quadrant. IMPRESSION: 1. Hepatic cirrhosis with small volume ascites. No discrete mass lesion identified. 2. Mild splenomegaly, suggesting underlying portal hypertension. 3. Cholelithiasis. No sonographic features to suggest acute cholecystitis. 4. Nonobstructive bilateral nephrolithiasis as above. Electronically Signed   By: Rise Mu M.D.   On: 04/07/2017 22:47   Medications: I have reviewed the patient's current medications.  Assessment/Plan: 1) Nash induced cirrhosis with minimal ascites and  a recent ultrasound. No need for paracentesis at this time.  2) Acute on chronic systolic and diastolic congestive heart failure being  managed by cardiology. 3) Ischemic cardiomyopathy/coronary artery disease. 4) Chronic kidney disease.  5) AODM.  6) Status post bilateral BKA's.  7)  Morbid obesity.   LOS: 2 days   Sue Martin 04/08/2017, 7:54 AM

## 2017-04-08 NOTE — Progress Notes (Signed)
[facility] cardiology called back and aware of no PICC insertion without nephro consult or progress note clarifying  PA also aware needing to specify which arm preferred for PICC

## 2017-04-08 NOTE — Progress Notes (Signed)
Spoke with RN regarding Nephrology is required to approve PICC placement d/t Renal hx per MD notes prior to PICC placement.

## 2017-04-08 NOTE — Progress Notes (Signed)
Paged MD regarding pt refusing medications states 104/56 is a low BP for her  Encouraged pt to eat breakfast and move around in bed and will recheck later  Rechecked BP was 94/45  Pt states she will not take any morning medication until she sees an MD MD Alekh and Cardiology aware  No changes to medications at this time

## 2017-04-08 NOTE — Progress Notes (Signed)
Patient ID: Sue Martin, female   DOB: 04/26/1966, 51 y.o.   MRN: [id number]  PROGRESS NOTE    Sue Martin  QMV:[id number] DOB: October 01, 1965 DOA: 04/06/2017 PCP: Sue Koyanagi, DO   Brief Narrative:  51 year female with history of CHF with EF of 35%, coronary disease, diverticulitis, cirrhosis of liver, chronic kidney disease presented with shortness of breath and increasing weight gain. She was admitted with acute on chronic CHF and started on IV Lasix.  Assessment & Plan:   Principal Problem:   Acute on chronic systolic CHF (congestive heart failure) (HCC) Active Problems:   Cirrhosis (HCC)   S/P bilateral BKA (below knee amputation) (HCC)   CAD (coronary artery disease)   CKD (chronic kidney disease) stage 3, GFR 30-59 ml/min (HCC)   Type II diabetes mellitus with ophthalmic manifestations, uncontrolled (HCC)   Hyperlipidemia   Cardiomyopathy, ischemic   Jaundice   Elevated bilirubin   Acute renal failure with acute tubular necrosis superimposed on stage 4 chronic kidney disease (HCC)    Acute on chronic systolic CHF (congestive heart failure) - Echo in July had shown ejection fraction of 30-35% with grade 3 diastolic dysfunction. - Continue Lasix IV. Cardiology following. Metolazone, hydralazine and Imdur discontinued by cardiology because of hypotension.  - Strict input and output. Daily weights  - Monitoring renal function closely   Cirrhosis of liver  - GI evaluation appreciated. Ultrasound showed only very minimal ascites so will not need paracentesis   Coronary artery disease - Stable  Peripheral arterial disease  S/P bilateral BKA (below knee amputation) : stable    CKD (chronic kidney disease) stage 3  - Creatinine slightly worse today. Repeat a.m. labs. Metolazone discontinued by cardiology. If renal function worsens, might need nephrology evaluation     Type II diabetes mellitus uncontrolled- continue short-acting and long-acting insulin along with sliding  scale insulin  Hypokalemia - Improving. Continue replacement. Repeat a.m. labs   Thrombocytopenia - probably from Cirrhosis - monitor    DVT prophylaxis: SCDs Code Status:  Full Family Communication: None at bedside Disposition Plan: Home in 2-3 days  Consultants: Cardiology and GI  Procedures: None  Antimicrobials: None    Subjective: Patient seen and examined at bedside. She still feels short of breath and does not think that she has improved since admission. She denies any current fever, nausea or vomiting.  Objective: Vitals:   04/07/17 2133 04/08/17 0529 04/08/17 0807 04/08/17 0935  BP: (!) 93/46 (!) 100/58 (!) 104/56 (!) 94/45  Pulse: 67 (!) 59 60 62  Resp: Temp: 98.2 F (36.8 C) 97.9 F (36.6 C)    TempSrc: Oral Oral    SpO2: 90% 96% 98%   Weight:  111.6 kg (246 lb 1.6 oz)    Height:        Intake/Output Summary (Last 24 hours) at 04/08/17 1152 Last data filed at 04/08/17 0600  Gross per 24 hour  Intake              360 ml  Output                0 ml  Net              360 ml   Filed Weights   04/06/17 2235 04/07/17 0530 04/08/17 0529  Weight: 110.8 kg (244 lb 4.8 oz) 110.7 kg (244 lb) 111.6 kg (246 lb 1.6 oz)    Examination:  General exam: Appears calm and comfortable  Respiratory system: Bilateral decreased breath sound at bases With basilar crackles Cardiovascular system: S1 & S2 heard, rate controlled Gastrointestinal system: Abdomen is distended,and nontender. Normal bowel sounds heard. Moderate pannus edema Extremities: No cyanosis, clubbing; bilateral BKA   Data Reviewed: I have personally reviewed following labs and imaging studies  CBC:  Recent Labs Lab 04/06/17 1810 04/08/17 0534  WBC 4.4 5.0  NEUTROABS 3.5 3.7  HGB 12.6 11.1*  HCT 39.5 35.6*  MCV 99.7 100.6*  PLT 74* 77*   Basic Metabolic Panel:  Recent Labs Lab 04/06/17 1810 04/07/17 0520 04/08/17 0534  NA 137 137 138  K 3.0* 2.8* 3.7  CL 87* 87* 90*    CO2 39* 39* 35*  GLUCOSE 153* 119* 151*  BUN 36* 35* 42*  CREATININE 1.54* 1.51* 2.04*  CALCIUM 9.3 9.3 9.0  MG  --   --  2.2   GFR: Estimated Creatinine Clearance: 33.9 mL/min (A) (by C-G formula based on SCr of 2.04 mg/dL (H)). Liver Function Tests:  Recent Labs Lab 04/06/17 1810  AST 30  ALT 11*  ALKPHOS 122  BILITOT 5.2*  PROT 7.0  ALBUMIN 3.3*    Recent Labs Lab 04/06/17 1810  LIPASE 30   No results for input(s): AMMONIA in the last 168 hours. Coagulation Profile:  Recent Labs Lab 04/06/17 1927  INR 1.20   Cardiac Enzymes: No results for input(s): CKTOTAL, CKMB, CKMBINDEX, TROPONINI in the last 168 hours. BNP (last 3 results) No results for input(s): PROBNP in the last 8760 hours. HbA1C: No results for input(s): HGBA1C in the last 72 hours. CBG:  Recent Labs Lab 04/07/17 0722 04/07/17 1201 04/07/17 1733 04/07/17 2132 04/08/17 0714  GLUCAP 118* 203* 173* 96 133*   Lipid Profile: No results for input(s): CHOL, HDL, LDLCALC, TRIG, CHOLHDL, LDLDIRECT in the last 72 hours. Thyroid Function Tests: No results for input(s): TSH, T4TOTAL, FREET4, T3FREE, THYROIDAB in the last 72 hours. Anemia Panel: No results for input(s): VITAMINB12, FOLATE, FERRITIN, TIBC, IRON, RETICCTPCT in the last 72 hours. Sepsis Labs: No results for input(s): PROCALCITON, LATICACIDVEN in the last 168 hours.  No results found for this or any previous visit (from the past 240 hour(s)).       Radiology Studies: Dg Chest 2 View  Result Date: 04/06/2017 CLINICAL DATA:  Shortness of breath EXAM: CHEST  2 VIEW COMPARISON:  Chest radiograph 01/27/2017 FINDINGS: Unchanged cardiomegaly. Right greater than left basilar opacities. No pneumothorax or pleural effusion. IMPRESSION: Cardiomegaly and right-greater-than-left basilar predominant opacities. Pulmonary edema favored, though pneumonia would be difficult to exclude in the appropriate context. Electronically Signed   By: Deatra Robinson M.D.   On: 04/06/2017 18:50   US Abdomen Complete  Result Date: 04/07/2017 CLINICAL DATA:  Initial evaluation for elevated bilirubin, cirrhosis. EXAM: ABDOMEN ULTRASOUND COMPLETE COMPARISON:  Prior ultrasound from 07/06/2015. FINDINGS: Gallbladder: Echogenic calculi measuring up to 6 mm present within the gallbladder. Gallbladder wall measure within normal limits at 2.5 mm. No sonographic Murphy sign elicited on exam. No free pericholecystic fluid. Common bile duct: Diameter: 3 mm Liver: Liver demonstrates a coarse heterogeneous echotexture with nodular contour, consistent with cirrhosis. No discrete intrahepatic mass. Portal vein is patent on color Doppler imaging with normal direction of blood flow towards the liver. IVC: No abnormality visualized. Pancreas: Visualized portion unremarkable. Spleen: Spleen enlarged measuring 16 cm in length. Right Kidney: Length: 11.8 cm. Cortical thinning with normal parenchymal echogenicity. No mass or hydronephrosis visualized. Left Kidney: Length: 10.6 cm. Cortical thinning with normal  parenchymal echogenicity. 6 mm shadowing echogenic calculus present within the lower pole. No mass lesion or hydronephrosis. Abdominal aorta: No aneurysm visualized. Other findings: Trace ascites present around the liver, with additional studies within the bilateral lower quadrant. IMPRESSION: 1. Hepatic cirrhosis with small volume ascites. No discrete mass lesion identified. 2. Mild splenomegaly, suggesting underlying portal hypertension. 3. Cholelithiasis. No sonographic features to suggest acute cholecystitis. 4. Nonobstructive bilateral nephrolithiasis as above. Electronically Signed   By: Rise Mu M.D.   On: 04/07/2017 22:47        Scheduled Meds: . aspirin  325 mg Oral QHS  . atorvastatin  20 mg Oral QPM  . furosemide  40 mg Intravenous BID  . insulin aspart  35 Units Subcutaneous TID WC  . insulin glargine  50 Units Subcutaneous Daily  . metoCLOPramide   5 mg Oral TID AC  . metoprolol succinate  25 mg Oral Daily  . potassium chloride  40 mEq Oral BID  . sodium chloride flush  3 mL Intravenous Q12H   Continuous Infusions: . sodium chloride       LOS: 2 days        Glade Lloyd, MD [facility] Pager [phone]  If 7PM-7AM, please contact night-coverage [email or web address] Password [email or web address] 04/08/2017, 11:52 AM

## 2017-04-09 ENCOUNTER — Inpatient Hospital Stay (HOSPITAL_COMMUNITY): Payer: Medicare HMO

## 2017-04-09 DIAGNOSIS — E1139 Type 2 diabetes mellitus with other diabetic ophthalmic complication: Secondary | ICD-10-CM

## 2017-04-09 DIAGNOSIS — E785 Hyperlipidemia, unspecified: Secondary | ICD-10-CM

## 2017-04-09 DIAGNOSIS — E1165 Type 2 diabetes mellitus with hyperglycemia: Secondary | ICD-10-CM

## 2017-04-09 LAB — COMPREHENSIVE METABOLIC PANEL
ALBUMIN: 3.3 g/dL — AB (ref 3.5–5.0)
ALK PHOS: 149 U/L — AB (ref 38–126)
ALT: 10 U/L — AB (ref 14–54)
AST: 19 U/L (ref 15–41)
Anion gap: 14 (ref 5–15)
BILIRUBIN TOTAL: 4.1 mg/dL — AB (ref 0.3–1.2)
BUN: 51 mg/dL — AB (ref 6–20)
CALCIUM: 9.3 mg/dL (ref 8.9–10.3)
CO2: 34 mmol/L — AB (ref 22–32)
Chloride: 91 mmol/L — ABNORMAL LOW (ref 101–111)
Creatinine, Ser: 2.64 mg/dL — ABNORMAL HIGH (ref 0.44–1.00)
GFR calc Af Amer: 23 mL/min — ABNORMAL LOW (ref >60)
GFR calc non Af Amer: 20 mL/min — ABNORMAL LOW (ref >60)
GLUCOSE: 163 mg/dL — AB (ref 65–99)
Potassium: 4.9 mmol/L (ref 3.5–5.1)
SODIUM: 139 mmol/L (ref 135–145)
TOTAL PROTEIN: 7.2 g/dL (ref 6.5–8.1)

## 2017-04-09 LAB — CBC WITH DIFFERENTIAL/PLATELET
BASOS ABS: 0 10*3/uL (ref 0.0–0.1)
BASOS PCT: 1 %
Eosinophils Absolute: 0.1 10*3/uL (ref 0.0–0.7)
Eosinophils Relative: 3 %
HEMATOCRIT: 39 % (ref 36.0–46.0)
Hemoglobin: 12.2 g/dL (ref 12.0–15.0)
Lymphocytes Relative: 11 %
Lymphs Abs: 0.6 10*3/uL — ABNORMAL LOW (ref 0.7–4.0)
MCH: 31.6 pg (ref 26.0–34.0)
MCHC: 31.3 g/dL (ref 30.0–36.0)
MCV: 101 fL — ABNORMAL HIGH (ref 78.0–100.0)
MONO ABS: 0.5 10*3/uL (ref 0.1–1.0)
Monocytes Relative: 10 %
NEUTROS ABS: 3.9 10*3/uL (ref 1.7–7.7)
NEUTROS PCT: 76 %
Platelets: 161 10*3/uL (ref 150–400)
RBC: 3.86 MIL/uL — AB (ref 3.87–5.11)
RDW: 16.5 % — AB (ref 11.5–15.5)
WBC: 5.2 10*3/uL (ref 4.0–10.5)

## 2017-04-09 LAB — GLUCOSE, CAPILLARY
GLUCOSE-CAPILLARY: 107 mg/dL — AB (ref 65–99)
GLUCOSE-CAPILLARY: 186 mg/dL — AB (ref 65–99)
GLUCOSE-CAPILLARY: 72 mg/dL (ref 65–99)
Glucose-Capillary: 177 mg/dL — ABNORMAL HIGH (ref 65–99)
Glucose-Capillary: 142 mg/dL — ABNORMAL HIGH (ref 65–99)
Glucose-Capillary: 73 mg/dL (ref 65–99)
Glucose-Capillary: 63 mg/dL — ABNORMAL LOW (ref 65–99)

## 2017-04-09 LAB — BRAIN NATRIURETIC PEPTIDE: B Natriuretic Peptide: 459.2 pg/mL — ABNORMAL HIGH (ref 0.0–100.0)

## 2017-04-09 LAB — COOXEMETRY PANEL
Carboxyhemoglobin: 1.8 % — ABNORMAL HIGH (ref 0.5–1.5)
Methemoglobin: 0.8 % (ref 0.0–1.5)
O2 Saturation: 52.3 %
Total hemoglobin: 15.1 g/dL (ref 12.0–16.0)

## 2017-04-09 LAB — MAGNESIUM: Magnesium: 2.3 mg/dL (ref 1.7–2.4)

## 2017-04-09 MED ORDER — INSULIN DEGLUDEC 100 UNIT/ML ~~LOC~~ SOPN
30.0000 [IU] | PEN_INJECTOR | Freq: Every day | SUBCUTANEOUS | Status: DC
  Filled 2017-04-09 (×3): qty 3

## 2017-04-09 MED ORDER — SODIUM CHLORIDE 0.9% FLUSH
10.0000 mL | INTRAVENOUS | Status: DC | PRN
  Administered 2017-04-18: 10 mL
  Filled 2017-04-09: qty 40

## 2017-04-09 NOTE — Progress Notes (Signed)
Patient ID: Sue Martin, female   DOB: 19-Nov-1965, 51 y.o.   MRN: [id number]  PROGRESS NOTE    Ailani Chao  WGN:[id number] DOB: April 03, 1966 DOA: 04/06/2017 PCP: Terressa Koyanagi, DO   Brief Narrative:  51 year female with history of CHF with EF of 35%, coronary disease, diverticulitis, cirrhosis of liver, chronic kidney disease presented with shortness of breath and increasing weight gain. She was admitted with acute on chronic CHF and started on IV Lasix.  Assessment & Plan:   Principal Problem:   Acute on chronic systolic CHF (congestive heart failure) (HCC) Active Problems:   Cirrhosis (HCC)   S/P bilateral BKA (below knee amputation) (HCC)   CAD (coronary artery disease)   CKD (chronic kidney disease) stage 3, GFR 30-59 ml/min (HCC)   Type II diabetes mellitus with ophthalmic manifestations, uncontrolled (HCC)   Hyperlipidemia   Cardiomyopathy, ischemic   Jaundice   Elevated bilirubin   Acute renal failure with acute tubular necrosis superimposed on stage 4 chronic kidney disease (HCC)    Acute on chronic systolic CHF (congestive heart failure) - Echo in July had shown ejection fraction of 30-35% with grade 3 diastolic dysfunction. - Cardiology following. Hold Lasix for today because of worsening renal function. Metolazone, hydralazine and Imdur discontinued by cardiology because of hypotension.  - Strict input and output. Daily weights  - Monitoring renal function closely   Cirrhosis of liver  - GI evaluation appreciated. Ultrasound showed only very minimal ascites so will not need paracentesis   Coronary artery disease - Stable. Continue aspirin, statin and metoprolol  Peripheral arterial disease  S/P bilateral BKA (below knee amputation) : stable    CKD (chronic kidney disease) stage 3  - Creatinine slightly worse today. Lasix on hold for today. Repeat a.m. labs. Metolazone discontinued yesterday by cardiology. If renal function worsens, might need nephrology  evaluation     Type II diabetes mellitus uncontrolled- continue short-acting and long-acting insulin along with sliding scale insulin  Hypokalemia - improved.   Thrombocytopenia - probably from Cirrhosis - improved    DVT prophylaxis: SCDs Code Status:  Full Family Communication: None at bedside Disposition Plan: Home in 2-3 days  Consultants: Cardiology and GI  Procedures: None  Antimicrobials: None    Subjective: Patient seen and examined at bedside. She still feels short of breath but feels that she has slightly improved since admission. She denies any current fever, nausea or vomiting.  Objective: Vitals:   04/08/17 0935 04/08/17 1300 04/08/17 1936 04/09/17 0654  BP: (!) 94/45 (!) 104/57 (!) 91/47 (!) 99/58  Pulse: 62  60 64  Resp:  (!) 118 18 16  Temp:  97.6 F (36.4 C) 97.9 F (36.6 C) 97.6 F (36.4 C)  TempSrc:  Oral Oral Oral  SpO2:  94% 94% 92%  Weight:    111.7 kg (246 lb 4.8 oz)  Height:        Intake/Output Summary (Last 24 hours) at 04/09/17 1143 Last data filed at 04/09/17 0855  Gross per 24 hour  Intake              243 ml  Output              250 ml  Net               -7 ml   Filed Weights   04/07/17 0530 04/08/17 0529 04/09/17 0654  Weight: 110.7 kg (244 lb) 111.6 kg (246 lb 1.6 oz) 111.7 kg (246 lb 4.8  oz)    Examination:  General exam: Appears calm and comfortable  Respiratory system: Bilateral decreased breath sound at bases With basilar crackles Cardiovascular system: S1 & S2 heard, rate controlled Gastrointestinal system: Abdomen is distended,and nontender. Normal bowel sounds heard. Moderate pannus edema Extremities: No cyanosis, clubbing; bilateral BKA   Data Reviewed: I have personally reviewed following labs and imaging studies  CBC:  Recent Labs Lab 04/06/17 1810 04/08/17 0534 04/09/17 0426  WBC 4.4 5.0 5.2  NEUTROABS 3.5 3.7 3.9  HGB 12.6 11.1* 12.2  HCT 39.5 35.6* 39.0  MCV 99.7 100.6* 101.0*  PLT 74* 77* 161     Basic Metabolic Panel:  Recent Labs Lab 04/06/17 1810 04/07/17 0520 04/08/17 0534 04/09/17 0426  NA 137 137 138 139  K 3.0* 2.8* 3.7 4.9  CL 87* 87* 90* 91*  CO2 39* 39* 35* 34*  GLUCOSE 153* 119* 151* 163*  BUN 36* 35* 42* 51*  CREATININE 1.54* 1.51* 2.04* 2.64*  CALCIUM 9.3 9.3 9.0 9.3  MG  --   --  2.2 2.3   GFR: Estimated Creatinine Clearance: 26.2 mL/min (A) (by C-G formula based on SCr of 2.64 mg/dL (H)). Liver Function Tests:  Recent Labs Lab 04/06/17 1810 04/09/17 0426  AST 30 19  ALT 11* 10*  ALKPHOS 122 149*  BILITOT 5.2* 4.1*  PROT 7.0 7.2  ALBUMIN 3.3* 3.3*    Recent Labs Lab 04/06/17 1810  LIPASE 30   No results for input(s): AMMONIA in the last 168 hours. Coagulation Profile:  Recent Labs Lab 04/06/17 1927  INR 1.20   Cardiac Enzymes: No results for input(s): CKTOTAL, CKMB, CKMBINDEX, TROPONINI in the last 168 hours. BNP (last 3 results) No results for input(s): PROBNP in the last 8760 hours. HbA1C: No results for input(s): HGBA1C in the last 72 hours. CBG:  Recent Labs Lab 04/08/17 0714 04/08/17 1152 04/08/17 1617 04/08/17 2125 04/09/17 0709  GLUCAP 133* 213* 153* 168* 177*   Lipid Profile: No results for input(s): CHOL, HDL, LDLCALC, TRIG, CHOLHDL, LDLDIRECT in the last 72 hours. Thyroid Function Tests: No results for input(s): TSH, T4TOTAL, FREET4, T3FREE, THYROIDAB in the last 72 hours. Anemia Panel: No results for input(s): VITAMINB12, FOLATE, FERRITIN, TIBC, IRON, RETICCTPCT in the last 72 hours. Sepsis Labs: No results for input(s): PROCALCITON, LATICACIDVEN in the last 168 hours.  No results found for this or any previous visit (from the past 240 hour(s)).       Radiology Studies: US Abdomen Complete  Result Date: 04/07/2017 CLINICAL DATA:  Initial evaluation for elevated bilirubin, cirrhosis. EXAM: ABDOMEN ULTRASOUND COMPLETE COMPARISON:  Prior ultrasound from 07/06/2015. FINDINGS: Gallbladder: Echogenic  calculi measuring up to 6 mm present within the gallbladder. Gallbladder wall measure within normal limits at 2.5 mm. No sonographic Murphy sign elicited on exam. No free pericholecystic fluid. Common bile duct: Diameter: 3 mm Liver: Liver demonstrates a coarse heterogeneous echotexture with nodular contour, consistent with cirrhosis. No discrete intrahepatic mass. Portal vein is patent on color Doppler imaging with normal direction of blood flow towards the liver. IVC: No abnormality visualized. Pancreas: Visualized portion unremarkable. Spleen: Spleen enlarged measuring 16 cm in length. Right Kidney: Length: 11.8 cm. Cortical thinning with normal parenchymal echogenicity. No mass or hydronephrosis visualized. Left Kidney: Length: 10.6 cm. Cortical thinning with normal parenchymal echogenicity. 6 mm shadowing echogenic calculus present within the lower pole. No mass lesion or hydronephrosis. Abdominal aorta: No aneurysm visualized. Other findings: Trace ascites present around the liver, with additional studies within the  bilateral lower quadrant. IMPRESSION: 1. Hepatic cirrhosis with small volume ascites. No discrete mass lesion identified. 2. Mild splenomegaly, suggesting underlying portal hypertension. 3. Cholelithiasis. No sonographic features to suggest acute cholecystitis. 4. Nonobstructive bilateral nephrolithiasis as above. Electronically Signed   By: Rise Mu M.D.   On: 04/07/2017 22:47        Scheduled Meds: . aspirin  325 mg Oral QHS  . atorvastatin  20 mg Oral QPM  . insulin aspart  35 Units Subcutaneous TID WC  . [START ON 04/10/2017] insulin degludec  30 Units Subcutaneous Q2200  . metoCLOPramide  5 mg Oral TID AC  . metoprolol succinate  25 mg Oral Daily  . potassium chloride  40 mEq Oral BID  . sodium chloride flush  3 mL Intravenous Q12H   Continuous Infusions: . sodium chloride       LOS: 3 days        Glade Lloyd, MD [facility] Pager  [phone]  If 7PM-7AM, please contact night-coverage [email or web address] Password [email or web address] 04/09/2017, 11:43 AM

## 2017-04-09 NOTE — Progress Notes (Signed)
Patient called RN into room to say new PICC line bleeding. There was blood noted dripping down her arm.  Blood cleaned up, no new active bleeding, however there is blood saturating her dressing.  IV team paged to assess PICC and change dressing.  IV team called and stated to put a pressure on PICC site, and cover with gauze.  The dressing will be changed in 2 days.  PICC covered in gauze.  Will continue to monitor.

## 2017-04-09 NOTE — Progress Notes (Signed)
Peripherally Inserted Central Catheter/Midline Placement  The IV Nurse has discussed with the patient and/or persons authorized to consent for the patient, the purpose of this procedure and the potential benefits and risks involved with this procedure.  The benefits include less needle sticks, lab draws from the catheter, and the patient may be discharged home with the catheter. Risks include, but not limited to, infection, bleeding, blood clot (thrombus formation), and puncture of an artery; nerve damage and irregular heartbeat and possibility to perform a PICC exchange if needed/ordered by physician.  Alternatives to this procedure were also discussed.  Bard Power PICC patient education guide, fact sheet on infection prevention and patient information card has been provided to patient /or left at bedside.    PICC/Midline Placement Documentation        Sue Martin 04/09/2017, 7:53 AM

## 2017-04-09 NOTE — Telephone Encounter (Signed)
As discussed before I cannot treat her without consult request

## 2017-04-09 NOTE — Progress Notes (Signed)
Pharmacist Heart Failure Core Measure Documentation  Assessment: Sue Martin has an EF documented as 30-35% on 01/23/2017 by Clyde Lundborg.  Rationale: Heart failure patients with left ventricular systolic dysfunction (LVSD) and an EF < 40% should be prescribed an angiotensin converting enzyme inhibitor (ACEI) or angiotensin receptor blocker (ARB) at discharge unless a contraindication is documented in the medical record.  This patient is not currently on an ACEI or ARB for HF.  This note is being placed in the record in order to provide documentation that a contraindication to the use of these agents is present for this encounter.  ACE Inhibitor or Angiotensin Receptor Blocker is contraindicated (specify all that apply)    ACEI allergy AND ARB allergy   Angioedema   Moderate or severe aortic stenosis   Hyperkalemia   Hypotension   Renal artery stenosis   Worsening renal function, preexisting renal disease or dysfunction   Babs Bertin P 04/09/2017 1:51 PM

## 2017-04-09 NOTE — Progress Notes (Signed)
Consult put in for a newly placed PICC that was bleeding. Consulted PICC nurse concerning care. Informed patient's nurse to apply pressure for approximately five minutes, apply a couple 4x4s to reinforce dressing and apply a pressure dressing. Dressing will be changed in 48 hours and documented in chart.

## 2017-04-09 NOTE — Progress Notes (Signed)
Cross cover for the [facility] Subjective: Since I last evaluated the patient, there has not been much change. She is rather somnolent at the time of examination she denies having any abdominal pain nausea vomiting minimal ascites noted on her scan.  Objective: Vital signs in last 24 hours: Temp:  [97.6 F (36.4 C)-97.9 F (36.6 C)] 97.6 F (36.4 C) (10/14 0654) Pulse Rate:  [60-64] 64 (10/14 0654) Resp:  [16-118] 16 (10/14 0654) BP: (91-104)/(47-58) 99/58 (10/14 0654) SpO2:  [92 %-94 %] 92 % (10/14 0654) Weight:  [111.7 kg (246 lb 4.8 oz)] 111.7 kg (246 lb 4.8 oz) (10/14 0654) Last BM Date: 04/06/17  Intake/Output from previous day: 10/13 0701 - 10/14 0700 In: 480 [P.O.:480] Out: 250 [Urine:250] Intake/Output this shift: Total I/O In: 3 [I.V.:3] Out: -   General appearance: appears older than stated age, fatigued, no distress, morbidly obese, pale and slowed mentation Resp: clear to auscultation bilaterally Cardio: regular rate and rhythm, S1, S2 normal, no murmur, click, rub or gallop GI: morbidly obese, soft, non-tender; bowel sounds normal; no masses,  no organomegaly Extremities: bilateral BKA's  Lab Results:  Recent Labs  04/06/17 1810 04/08/17 0534 04/09/17 0426  WBC 4.4 5.0 5.2  HGB 12.6 11.1* 12.2  HCT 39.5 35.6* 39.0  PLT 74* 77* 161   BMET  Recent Labs  04/07/17 0520 04/08/17 0534 04/09/17 0426  NA 137 138 139  K 2.8* 3.7 4.9  CL 87* 90* 91*  CO2 39* 35* 34*  GLUCOSE 119* 151* 163*  BUN 35* 42* 51*  CREATININE 1.51* 2.04* 2.64*  CALCIUM 9.3 9.0 9.3   LFT  Recent Labs  04/09/17 0426  PROT 7.2  ALBUMIN 3.3*  AST 19  ALT 10*  ALKPHOS 149*  BILITOT 4.1*   PT/INR  Recent Labs  04/06/17 1927  LABPROT 15.2  INR 1.20   Studies/Results: Dg Chest 2 View  Result Date: 04/09/2017 CLINICAL DATA:  Follow-up of congestive heart failure. Improving shortness of breath. EXAM: CHEST  2 VIEW COMPARISON:  04/06/2017 FINDINGS: Right-sided PICC  line terminates at the expected location of the cavoatrial junction. Enlarged cardiac silhouette.  Mediastinal contours appear intact. There is no evidence of focal airspace consolidation, pleural effusion or pneumothorax. Mild improvement in pulmonary edema. Osseous structures are without acute abnormality. Soft tissues are grossly normal. IMPRESSION: Mild improvement in interstitial pulmonary edema. Enlarged cardiac silhouette. Electronically Signed   By: Ted Mcalpine M.D.   On: 04/09/2017 11:45   US Abdomen Complete  Result Date: 04/07/2017 CLINICAL DATA:  Initial evaluation for elevated bilirubin, cirrhosis. EXAM: ABDOMEN ULTRASOUND COMPLETE COMPARISON:  Prior ultrasound from 07/06/2015. FINDINGS: Gallbladder: Echogenic calculi measuring up to 6 mm present within the gallbladder. Gallbladder wall measure within normal limits at 2.5 mm. No sonographic Murphy sign elicited on exam. No free pericholecystic fluid. Common bile duct: Diameter: 3 mm Liver: Liver demonstrates a coarse heterogeneous echotexture with nodular contour, consistent with cirrhosis. No discrete intrahepatic mass. Portal vein is patent on color Doppler imaging with normal direction of blood flow towards the liver. IVC: No abnormality visualized. Pancreas: Visualized portion unremarkable. Spleen: Spleen enlarged measuring 16 cm in length. Right Kidney: Length: 11.8 cm. Cortical thinning with normal parenchymal echogenicity. No mass or hydronephrosis visualized. Left Kidney: Length: 10.6 cm. Cortical thinning with normal parenchymal echogenicity. 6 mm shadowing echogenic calculus present within the lower pole. No mass lesion or hydronephrosis. Abdominal aorta: No aneurysm visualized. Other findings: Trace ascites present around the liver, with additional studies within  the bilateral lower quadrant. IMPRESSION: 1. Hepatic cirrhosis with small volume ascites. No discrete mass lesion identified. 2. Mild splenomegaly, suggesting underlying  portal hypertension. 3. Cholelithiasis. No sonographic features to suggest acute cholecystitis. 4. Nonobstructive bilateral nephrolithiasis as above. Electronically Signed   By: Rise Mu M.D.   On: 04/07/2017 22:47   Medications: I have reviewed the patient's current medications.  Assessment/Plan: 1) Acute and chronic systolic CHF.  2) NASH cirrhosis. 3) CKD.  4) CAD.  LOS: 3 days   Bricelyn Freestone 04/09/2017, 12:08 PM

## 2017-04-09 NOTE — Progress Notes (Signed)
Progress Note  Patient Name: Sue Martin Date of Encounter: 04/09/2017 Primary Cardiologist: Dr. Gwen Her, [facility])  Subjective   She feels better today and was able to sleep.   Inpatient Medications    Scheduled Meds: . aspirin  325 mg Oral QHS  . atorvastatin  20 mg Oral QPM  . furosemide  40 mg Intravenous BID  . insulin aspart  35 Units Subcutaneous TID WC  . [START ON 04/10/2017] insulin degludec  30 Units Subcutaneous Q2200  . metoCLOPramide  5 mg Oral TID AC  . metoprolol succinate  25 mg Oral Daily  . potassium chloride  40 mEq Oral BID  . sodium chloride flush  3 mL Intravenous Q12H   Continuous Infusions: . sodium chloride     PRN Meds: sodium chloride, acetaminophen, ondansetron (ZOFRAN) IV, sodium chloride flush, sodium chloride flush   Vital Signs    Vitals:   04/08/17 0935 04/08/17 1300 04/08/17 1936 04/09/17 0654  BP: (!) 94/45 (!) 104/57 (!) 91/47 (!) 99/58  Pulse: 62  60 64  Resp:  (!) 118 18 16  Temp:  97.6 F (36.4 C) 97.9 F (36.6 C) 97.6 F (36.4 C)  TempSrc:  Oral Oral Oral  SpO2:  94% 94% 92%  Weight:    246 lb 4.8 oz (111.7 kg)  Height:        Intake/Output Summary (Last 24 hours) at 04/09/17 0946 Last data filed at 04/09/17 0855  Gross per 24 hour  Intake              243 ml  Output              250 ml  Net               -7 ml   Filed Weights   04/07/17 0530 04/08/17 0529 04/09/17 0654  Weight: 244 lb (110.7 kg) 246 lb 1.6 oz (111.6 kg) 246 lb 4.8 oz (111.7 kg)    Telemetry    A-fib, rate controlled - Personally Reviewed  Physical Exam   GEN: No acute distress.  Morbidly obese Neck: No JVD Cardiac:i RRR, no murmurs, rubs, or gallops.  Respiratory: CTA bilaterally. GI: Soft, nontender, non-distended  MS: B/L LE amputation Neuro:  Nonfocal  Psych: Normal affect   Labs    Chemistry  Recent Labs Lab 04/06/17 1810 04/07/17 0520 04/08/17 0534 04/09/17 0426  NA 137 137 138 139  K 3.0* 2.8*  3.7 4.9  CL 87* 87* 90* 91*  CO2 39* 39* 35* 34*  GLUCOSE 153* 119* 151* 163*  BUN 36* 35* 42* 51*  CREATININE 1.54* 1.51* 2.04* 2.64*  CALCIUM 9.3 9.3 9.0 9.3  PROT 7.0  --   --  7.2  ALBUMIN 3.3*  --   --  3.3*  AST 30  --   --  19  ALT 11*  --   --  10*  ALKPHOS 122  --   --  149*  BILITOT 5.2*  --   --  4.1*  GFRNONAA 38* 39* 27* 20*  GFRAA 44* 45* 32* 23*  ANIONGAP Hematology  Recent Labs Lab 04/06/17 1810 04/08/17 0534 04/09/17 0426  WBC 4.4 5.0 5.2  RBC 3.96 3.54* 3.86*  HGB 12.6 11.1* 12.2  HCT 39.5 35.6* 39.0  MCV 99.7 100.6* 101.0*  MCH 31.8 31.4 31.6  MCHC 31.9 31.2 31.3  RDW 16.1* 16.1* 16.5*  PLT 74* 77* 161  Cardiac EnzymesNo results for input(s): TROPONINI in the last 168 hours.   Recent Labs Lab 04/06/17 1821  TROPIPOC 0.04     BNP  Recent Labs Lab 04/06/17 1810  BNP 455.6*     DDimer No results for input(s): DDIMER in the last 168 hours.   Radiology    US Abdomen Complete  Result Date: 04/07/2017 CLINICAL DATA:  Initial evaluation for elevated bilirubin, cirrhosis. EXAM: ABDOMEN ULTRASOUND COMPLETE COMPARISON:  Prior ultrasound from 07/06/2015. FINDINGS: Gallbladder: Echogenic calculi measuring up to 6 mm present within the gallbladder. Gallbladder wall measure within normal limits at 2.5 mm. No sonographic Murphy sign elicited on exam. No free pericholecystic fluid. Common bile duct: Diameter: 3 mm Liver: Liver demonstrates a coarse heterogeneous echotexture with nodular contour, consistent with cirrhosis. No discrete intrahepatic mass. Portal vein is patent on color Doppler imaging with normal direction of blood flow towards the liver. IVC: No abnormality visualized. Pancreas: Visualized portion unremarkable. Spleen: Spleen enlarged measuring 16 cm in length. Right Kidney: Length: 11.8 cm. Cortical thinning with normal parenchymal echogenicity. No mass or hydronephrosis visualized. Left Kidney: Length: 10.6 cm. Cortical  thinning with normal parenchymal echogenicity. 6 mm shadowing echogenic calculus present within the lower pole. No mass lesion or hydronephrosis. Abdominal aorta: No aneurysm visualized. Other findings: Trace ascites present around the liver, with additional studies within the bilateral lower quadrant. IMPRESSION: 1. Hepatic cirrhosis with small volume ascites. No discrete mass lesion identified. 2. Mild splenomegaly, suggesting underlying portal hypertension. 3. Cholelithiasis. No sonographic features to suggest acute cholecystitis. 4. Nonobstructive bilateral nephrolithiasis as above. Electronically Signed   By: Rise Mu M.D.   On: 04/07/2017 22:47    Cardiac Studies     Patient Profile     51 y.o. female   Assessment & Plan    1. 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 EF 30-35% with grade 3 DD in 12/2016. Wt at previous discharge in 12/2016 was 242. Wt 260 on admission, 246 lbs today. Seen by GI to evaluate for possible ascites related to cirrhosis. Plan for abd [place]. BNP 455.6. CXR with bibasilar predominant opacities favor edema, but could not exclude PNA. No leukocytosis or fever. Currently on lasix 40 mg IV bid and metolazone 5 mg daily. Wt down 260->244.  Volume status much improved, Crea uptrending, I will hold lasix today,  I will obtain Co-OX today as well as BNP and see if any advanced therapies are needed. If Coox normal, we will just reatsrt PO lasix starting Monday or Tuesday depending on kidney function.   2. CKD:  Baseline cr about 1.3.  Now 2.0. Follow closely while diuresing. D/C-ed metolazone yesterday, hold lasix today  3. CAD: Hx of PCI to LAD and RCA. Treated with aspirin, statin, BB, isosorbide. Continue current therapy.   4. Hypokalemia: Potassium supplementation. Follow closely. At home she takes KCL 30 mEq bid.   5. Diabetes type 2 on insulin:  Hemoglobin A1c was 8.4 in 12/2016. Was 10.1 in 04/2016. Management per primary, on home insulin dosing.   6. Peripheral artery disease: s/p bilateral BKA. Now wheelchair bound, She cannot fit into her prostheses.   7. Hyperlipidemia: LDL 92 on 01/23/17. Continue atorvastatin. Would increase dose to better control LDL with CAD and PVD, however, pt has hx of cirrhosis.     For questions or updates, please contact [facility] Please consult [email or web address] for contact info under Cardiology/STEMI.  Signed, Tobias Alexander, MD  04/09/2017, 9:46 AM

## 2017-04-10 ENCOUNTER — Inpatient Hospital Stay (HOSPITAL_COMMUNITY): Payer: Medicare HMO

## 2017-04-10 ENCOUNTER — Other Ambulatory Visit: Payer: Self-pay

## 2017-04-10 DIAGNOSIS — I2510 Atherosclerotic heart disease of native coronary artery without angina pectoris: Secondary | ICD-10-CM

## 2017-04-10 DIAGNOSIS — N179 Acute kidney failure, unspecified: Secondary | ICD-10-CM

## 2017-04-10 DIAGNOSIS — N170 Acute kidney failure with tubular necrosis: Secondary | ICD-10-CM

## 2017-04-10 DIAGNOSIS — R188 Other ascites: Secondary | ICD-10-CM

## 2017-04-10 DIAGNOSIS — N184 Chronic kidney disease, stage 4 (severe): Secondary | ICD-10-CM

## 2017-04-10 DIAGNOSIS — I255 Ischemic cardiomyopathy: Secondary | ICD-10-CM

## 2017-04-10 LAB — GLUCOSE, CAPILLARY
GLUCOSE-CAPILLARY: 179 mg/dL — AB (ref 65–99)
GLUCOSE-CAPILLARY: 163 mg/dL — AB (ref 65–99)
Glucose-Capillary: 86 mg/dL (ref 65–99)
Glucose-Capillary: 127 mg/dL — ABNORMAL HIGH (ref 65–99)
Glucose-Capillary: 214 mg/dL — ABNORMAL HIGH (ref 65–99)

## 2017-04-10 LAB — BLOOD GAS, ARTERIAL
ACID-BASE EXCESS: 11.4 mmol/L — AB (ref 0.0–2.0)
Bicarbonate: 35.9 mmol/L — ABNORMAL HIGH (ref 20.0–28.0)
Drawn by: 275531
O2 CONTENT: 2 L/min
O2 SAT: 95.3 %
PATIENT TEMPERATURE: 98.6
PO2 ART: 79 mmHg — AB (ref 83.0–108.0)
pCO2 arterial: 51.6 mmHg — ABNORMAL HIGH (ref 32.0–48.0)
pH, Arterial: 7.457 — ABNORMAL HIGH (ref 7.350–7.450)

## 2017-04-10 LAB — BASIC METABOLIC PANEL
Anion gap: 12 (ref 5–15)
BUN: 58 mg/dL — AB (ref 6–20)
CHLORIDE: 89 mmol/L — AB (ref 101–111)
CO2: 36 mmol/L — ABNORMAL HIGH (ref 22–32)
Calcium: 9.1 mg/dL (ref 8.9–10.3)
Creatinine, Ser: 2.52 mg/dL — ABNORMAL HIGH (ref 0.44–1.00)
GFR calc Af Amer: 24 mL/min — ABNORMAL LOW (ref >60)
GFR calc non Af Amer: 21 mL/min — ABNORMAL LOW (ref >60)
GLUCOSE: 129 mg/dL — AB (ref 65–99)
POTASSIUM: 4.5 mmol/L (ref 3.5–5.1)
SODIUM: 137 mmol/L (ref 135–145)

## 2017-04-10 LAB — COOXEMETRY PANEL
CARBOXYHEMOGLOBIN: 1.9 % — AB (ref 0.5–1.5)
CARBOXYHEMOGLOBIN: 2 % — AB (ref 0.5–1.5)
METHEMOGLOBIN: 1.1 % (ref 0.0–1.5)
Methemoglobin: 1.1 % (ref 0.0–1.5)
O2 SAT: 59.5 %
O2 Saturation: 57.5 %
Total hemoglobin: 12 g/dL (ref 12.0–16.0)
Total hemoglobin: 11.7 g/dL — ABNORMAL LOW (ref 12.0–16.0)

## 2017-04-10 LAB — MAGNESIUM: MAGNESIUM: 2.4 mg/dL (ref 1.7–2.4)

## 2017-04-10 LAB — MRSA PCR SCREENING: MRSA BY PCR: POSITIVE — AB

## 2017-04-10 MED ORDER — ORAL CARE MOUTH RINSE
15.0000 mL | Freq: Two times a day (BID) | OROMUCOSAL | Status: DC
  Administered 2017-04-11 – 2017-04-18 (×12): 15 mL via OROMUCOSAL

## 2017-04-10 MED ORDER — LOPERAMIDE HCL 2 MG PO CAPS
2.0000 mg | ORAL_CAPSULE | Freq: Every day | ORAL | Status: DC
  Administered 2017-04-11 – 2017-04-14 (×3): 2 mg via ORAL
  Filled 2017-04-10 (×4): qty 1

## 2017-04-10 MED ORDER — INSULIN ASPART 100 UNIT/ML ~~LOC~~ SOLN
0.0000 [IU] | SUBCUTANEOUS | Status: DC
  Administered 2017-04-10: 7 [IU] via SUBCUTANEOUS
  Administered 2017-04-10 – 2017-04-11 (×3): 4 [IU] via SUBCUTANEOUS
  Administered 2017-04-11: 11 [IU] via SUBCUTANEOUS
  Administered 2017-04-11 (×2): 7 [IU] via SUBCUTANEOUS
  Administered 2017-04-12 (×3): 4 [IU] via SUBCUTANEOUS
  Administered 2017-04-12: 11 [IU] via SUBCUTANEOUS
  Administered 2017-04-12: 4 [IU] via SUBCUTANEOUS
  Administered 2017-04-12: 3 [IU] via SUBCUTANEOUS
  Administered 2017-04-13: 7 [IU] via SUBCUTANEOUS
  Administered 2017-04-13: 3 [IU] via SUBCUTANEOUS
  Administered 2017-04-13: 7 [IU] via SUBCUTANEOUS
  Administered 2017-04-13: 3 [IU] via SUBCUTANEOUS
  Administered 2017-04-13: 4 [IU] via SUBCUTANEOUS
  Administered 2017-04-14: 3 [IU] via SUBCUTANEOUS
  Administered 2017-04-14: 4 [IU] via SUBCUTANEOUS
  Administered 2017-04-14: 3 [IU] via SUBCUTANEOUS
  Administered 2017-04-14 (×2): 4 [IU] via SUBCUTANEOUS
  Administered 2017-04-14 – 2017-04-16 (×5): 3 [IU] via SUBCUTANEOUS
  Administered 2017-04-16 (×3): 4 [IU] via SUBCUTANEOUS
  Administered 2017-04-17 – 2017-04-18 (×5): 3 [IU] via SUBCUTANEOUS

## 2017-04-10 MED ORDER — MILRINONE LACTATE IN DEXTROSE 20-5 MG/100ML-% IV SOLN
0.1250 ug/kg/min | INTRAVENOUS | Status: DC
  Administered 2017-04-10 – 2017-04-11 (×3): 0.25 ug/kg/min via INTRAVENOUS
  Administered 2017-04-13: 0.125 ug/kg/min via INTRAVENOUS
  Filled 2017-04-10 (×5): qty 100

## 2017-04-10 MED ORDER — ASPIRIN 81 MG PO CHEW
81.0000 mg | CHEWABLE_TABLET | Freq: Every day | ORAL | Status: DC
  Administered 2017-04-10 – 2017-04-17 (×8): 81 mg via ORAL
  Filled 2017-04-10 (×8): qty 1

## 2017-04-10 MED ORDER — FUROSEMIDE 10 MG/ML IJ SOLN
120.0000 mg | Freq: Three times a day (TID) | INTRAVENOUS | Status: DC
  Administered 2017-04-10 – 2017-04-14 (×11): 120 mg via INTRAVENOUS
  Filled 2017-04-10: qty 10
  Filled 2017-04-10: qty 12
  Filled 2017-04-10: qty 2
  Filled 2017-04-10 (×2): qty 12
  Filled 2017-04-10 (×4): qty 10
  Filled 2017-04-10 (×6): qty 12

## 2017-04-10 NOTE — Progress Notes (Signed)
Pt had CBG of 63, gave graham crackers and PB, and chocolate Ensure, rechecked CBG was 72, gave another Ensure rechecked CBG went to 107, will continue to monitor, THanks Lavonda Jumbo RN.

## 2017-04-10 NOTE — Progress Notes (Signed)
PA Mardelle Matte stated he will evaluate pt and change placement as necessary  Pt sitting in bed comfortable talking to family  Pt not in distress

## 2017-04-10 NOTE — Progress Notes (Signed)
Patient ID: Sue Martin, female   DOB: 07/17/1965, 51 y.o.   MRN: [id number]  PROGRESS NOTE    Sue Martin  JXB:[id number] DOB: 17-Jul-1965 DOA: 04/06/2017 PCP: Terressa Koyanagi, DO   Brief Narrative:  51 year female with history of CHF with EF of 35%, coronary disease, diverticulitis, cirrhosis of liver, chronic kidney disease presented with shortness of breath and increasing weight gain. She was admitted with acute on chronic CHF and started on IV Lasix.  Assessment & Plan:   Principal Problem:   Acute on chronic systolic CHF (congestive heart failure) (HCC) Active Problems:   Cirrhosis (HCC)   S/P bilateral BKA (below knee amputation) (HCC)   CAD (coronary artery disease)   CKD (chronic kidney disease) stage 3, GFR 30-59 ml/min (HCC)   Type II diabetes mellitus with ophthalmic manifestations, uncontrolled (HCC)   Hyperlipidemia   Cardiomyopathy, ischemic   Jaundice   Elevated bilirubin   Acute renal failure with acute tubular necrosis superimposed on stage 4 chronic kidney disease (HCC)    Acute on chronic systolic CHF (congestive heart failure) - Echo in July had shown ejection fraction of 30-35% with grade 3 diastolic dysfunction. - Cardiology following. Lasix was held yesterday because of worsening renal function. Metolazone, hydralazine and Imdur discontinued by cardiology because of hypotension. Creatinine slightly improved since yesterday. Follow cardiology recommendations about diuresis. - Strict input and output. Daily weights  - Monitoring renal function closely   Cirrhosis of liver  - GI follow up appreciated. Ultrasound showed only very minimal ascites so will not need paracentesis   Coronary artery disease - Stable. Continue aspirin, statin and metoprolol  Peripheral arterial disease  S/P bilateral BKA (below knee amputation) : stable    AKI on CKD (chronic kidney disease) stage 3  - Creatinine slightly better today.  Repeat a.m. labs. If renal function  worsens, might need nephrology evaluation     Type II diabetes mellitus uncontrolled- continue short-acting and long-acting insulin along with sliding scale insulin  Hypokalemia - improved.   Thrombocytopenia - probably from Cirrhosis - improved. monitor    DVT prophylaxis: SCDs Code Status:  Full Family Communication: None at bedside Disposition Plan: Home in 2-3 days  Consultants: Cardiology and GI  Procedures: None  Antimicrobials: None    Subjective: Patient seen and examined at bedside. She feels slightly better today but is still slightly short of breath. She denies any current fever, nausea or vomiting.  Objective: Vitals:   04/09/17 1300 04/09/17 2041 04/10/17 0300 04/10/17 0523  BP: (!) 101/57 (!) 92/50  106/61  Pulse: 63 63  68  Resp: Temp: (!) 97.5 F (36.4 C) 97.6 F (36.4 C)  (!) 97.5 F (36.4 C)  TempSrc: Oral Oral  Oral  SpO2: 98% 99%  92%  Weight:   110.5 kg (243 lb 8 oz)   Height:        Intake/Output Summary (Last 24 hours) at 04/10/17 0959 Last data filed at 04/10/17 0911  Gross per 24 hour  Intake             1080 ml  Output              751 ml  Net              329 ml   Filed Weights   04/08/17 0529 04/09/17 0654 04/10/17 0300  Weight: 111.6 kg (246 lb 1.6 oz) 111.7 kg (246 lb 4.8 oz) 110.5 kg (243 lb 8  oz)    Examination:  General exam: Appears calm and comfortable  Respiratory system: Bilateral decreased breath sound at bases With basilar crackles Cardiovascular system: S1 & S2 heard, rate controlled Gastrointestinal system: Abdomen is distended,and nontender. Normal bowel sounds heard. Moderate pannus edema Extremities: No cyanosis, clubbing; bilateral BKA   Data Reviewed: I have personally reviewed following labs and imaging studies  CBC:  Recent Labs Lab 04/06/17 1810 04/08/17 0534 04/09/17 0426  WBC 4.4 5.0 5.2  NEUTROABS 3.5 3.7 3.9  HGB 12.6 11.1* 12.2  HCT 39.5 35.6* 39.0  MCV 99.7 100.6* 101.0*    PLT 74* 77* 161   Basic Metabolic Panel:  Recent Labs Lab 04/06/17 1810 04/07/17 0520 04/08/17 0534 04/09/17 0426 04/10/17 0355  NA 137 137 138 139 137  K 3.0* 2.8* 3.7 4.9 4.5  CL 87* 87* 90* 91* 89*  CO2 39* 39* 35* 34* 36*  GLUCOSE 153* 119* 151* 163* 129*  BUN 36* 35* 42* 51* 58*  CREATININE 1.54* 1.51* 2.04* 2.64* 2.52*  CALCIUM 9.3 9.3 9.0 9.3 9.1  MG  --   --  2.2 2.3 2.4   GFR: Estimated Creatinine Clearance: 27.2 mL/min (A) (by C-G formula based on SCr of 2.52 mg/dL (H)). Liver Function Tests:  Recent Labs Lab 04/06/17 1810 04/09/17 0426  AST 30 19  ALT 11* 10*  ALKPHOS 122 149*  BILITOT 5.2* 4.1*  PROT 7.0 7.2  ALBUMIN 3.3* 3.3*    Recent Labs Lab 04/06/17 1810  LIPASE 30   No results for input(s): AMMONIA in the last 168 hours. Coagulation Profile:  Recent Labs Lab 04/06/17 1927  INR 1.20   Cardiac Enzymes: No results for input(s): CKTOTAL, CKMB, CKMBINDEX, TROPONINI in the last 168 hours. BNP (last 3 results) No results for input(s): PROBNP in the last 8760 hours. HbA1C: No results for input(s): HGBA1C in the last 72 hours. CBG:  Recent Labs Lab 04/09/17 2113 04/09/17 2136 04/09/17 2220 04/09/17 2341 04/10/17 0756  GLUCAP 73 63* 72 107* 86   Lipid Profile: No results for input(s): CHOL, HDL, LDLCALC, TRIG, CHOLHDL, LDLDIRECT in the last 72 hours. Thyroid Function Tests: No results for input(s): TSH, T4TOTAL, FREET4, T3FREE, THYROIDAB in the last 72 hours. Anemia Panel: No results for input(s): VITAMINB12, FOLATE, FERRITIN, TIBC, IRON, RETICCTPCT in the last 72 hours. Sepsis Labs: No results for input(s): PROCALCITON, LATICACIDVEN in the last 168 hours.  No results found for this or any previous visit (from the past 240 hour(s)).       Radiology Studies: Dg Chest 2 View  Result Date: 04/09/2017 CLINICAL DATA:  Follow-up of congestive heart failure. Improving shortness of breath. EXAM: CHEST  2 VIEW COMPARISON:   04/06/2017 FINDINGS: Right-sided PICC line terminates at the expected location of the cavoatrial junction. Enlarged cardiac silhouette.  Mediastinal contours appear intact. There is no evidence of focal airspace consolidation, pleural effusion or pneumothorax. Mild improvement in pulmonary edema. Osseous structures are without acute abnormality. Soft tissues are grossly normal. IMPRESSION: Mild improvement in interstitial pulmonary edema. Enlarged cardiac silhouette. Electronically Signed   By: Ted Mcalpine M.D.   On: 04/09/2017 11:45        Scheduled Meds: . aspirin  325 mg Oral QHS  . atorvastatin  20 mg Oral QPM  . insulin aspart  35 Units Subcutaneous TID WC  . insulin degludec  30 Units Subcutaneous Q2200  . loperamide  2 mg Oral QAC breakfast  . metoCLOPramide  5 mg Oral TID AC  .  metoprolol succinate  25 mg Oral Daily  . sodium chloride flush  3 mL Intravenous Q12H   Continuous Infusions: . sodium chloride       LOS: 4 days        Glade Lloyd, MD [facility] Pager [phone]  If 7PM-7AM, please contact night-coverage [email or web address] Password [email or web address] 04/10/2017, 9:59 AM

## 2017-04-10 NOTE — Progress Notes (Signed)
Report called to [facility] on 2 heart  Pt transferred with RN

## 2017-04-10 NOTE — Progress Notes (Signed)
Pt family aware of transfer  Pt has all belongings

## 2017-04-10 NOTE — Consult Note (Signed)
Advanced Heart Failure Team Consult Note  Primary Cardiologist:  Dr. Gwen Her, [facility])  Reason for Consultation: Acute on chronic systolic CHF  HPI:    Sue Martin is seen today for evaluation of Acute on chronic systolic CHF at the request of Dr. Mayford Knife.   Sue Martin is a 51 y.o. 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  Admitted 04/07/17 with worsening SOB, appearing "yellow", and increased edema. Her metolazone was recent decreased from DAILY to twice weekly. Pt c/o chest pressure, orthopnea, and PND. Pertinent labs on admission include K 3.0, Cr 1.54, BNP 459.2. Bilirubin 5.2. Troponin negative. CXR with R>L opacities and edema with ? PNA. ABD [place] 04/07/17 with hepatic cirrhosis and small volume ascites, mild splenomegaly, and cholelithiasis.   PICC line placed for difficult stick. Initial Coox 52.3% 04/09/17. Pt failed IV diuretics with no real improvement, and worsening renal function. Planned on starting milrinone and HF team consulted for guidance.   She is feeling OK currently. She states she feels better than admission, but still worse than her baseline. At baseline, she is very SOB with activity, especially transitioning from chair to toilet, which is basically the extent of her physical activity. She is wheelchair bound at home. Started feeling bad several weeks ago after her metolazone was decreased. She does not watch her salt and fluid closely. She has vague abdominal pain and poor appetite. Her BKA sites and abdomen are swollen. Her primary presenting complaint was jaundice and SOB.   ABG 04/10/17 on 2L ph 7.457, CO2 51, PO2 79 and HCO3 35.9  Overall her weight is only down 1 lb from admission. Renal function has risen from 1.54 -> 2.52.  Review of Systems: [y] = yes, [ ]  = no   General: Weight gain [y]; Weight loss [ ] ; Anorexia [  ]; Fatigue [y]; Fever [ ] ; Chills [ ] ; Weakness [y]  Cardiac: Chest pain/pressure [y]; Resting SOB [y]; Exertional SOB [y]; Orthopnea [y]; Pedal Edema [y]; Palpitations [ ] ; Syncope [ ] ; Presyncope [ ] ; Paroxysmal nocturnal dyspnea[ ]   Pulmonary: Cough [y]; Wheezing[ ] ; Hemoptysis[ ] ; Sputum [ ] ; Snoring [ ]   GI: Vomiting[ ] ; Dysphagia[ ] ; Melena[ ] ; Hematochezia [ ] ; Heartburn[ ] ; Abdominal pain [ ] ; Constipation [ ] ; Diarrhea [ ] ; BRBPR [ ]   GU: Hematuria[ ] ; Dysuria [ ] ; Nocturia[ ]   Vascular: Pain in legs with walking [ ] ; Pain in feet with lying flat [ ] ; Non-healing sores [ ] ; Stroke [ ] ; TIA [ ] ; Slurred speech [ ] ;  Neuro: Headaches[ ] ; Vertigo[ ] ; Seizures[ ] ; Paresthesias[ ] ;Blurred vision [ ] ; Diplopia [ ] ; Vision changes [ ]   Ortho/Skin: Arthritis [y]; Joint pain [y]; Muscle pain [ ] ; Joint swelling [ ] ; Back Pain [ ] ; Rash [ ]   Psych: Depression[ ] ; Anxiety[ ]   Heme: Bleeding problems [ ] ; Clotting disorders [ ] ; Anemia [ ]   Endocrine: Diabetes [y]; Thyroid dysfunction[ ]   Home Medications Prior to Admission medications   Medication Sig Start Date End Date Taking? Authorizing Provider  aspirin 325 MG tablet Take 325 mg by mouth at bedtime.    Yes [provider]  atorvastatin (LIPITOR) 20 MG tablet Take 1 tablet (20 mg total) by mouth daily. Patient taking differently: Take 20 mg by mouth every evening.  02/11/16  Yes Terressa Koyanagi, DO  cyclobenzaprine (FLEXERIL) 5  MG tablet Take 1 tablet (5 mg total) by mouth 2 (two) times daily as needed for muscle spasms. 11/17/16  Yes Kriste Basque R, DO  hydrALAZINE (APRESOLINE) 10 MG tablet Take 10 mg by mouth 2 (two) times daily.   Yes [provider]  insulin aspart (NOVOLOG) 100 UNIT/ML FlexPen Inject 35 Units into the skin 3 (three) times daily with meals. 11/30/16  Yes Reather Littler, MD  insulin degludec (TRESIBA FLEXTOUCH) 100 UNIT/ML SOPN FlexTouch Pen Inject 0.4 mLs (40 Units total) into the skin daily with  breakfast. Patient taking differently: Inject 30 Units into the skin daily with breakfast. 50U IN AM 10/28/16  Yes Reather Littler, MD  isosorbide mononitrate (IMDUR) 30 MG 24 hr tablet Take 0.5 tablets (15 mg total) by mouth daily. 01/31/17  Yes Penny Pia, MD  metoCLOPramide (REGLAN) 5 MG tablet Take 1 tablet (5 mg total) by mouth 3 (three) times daily before meals. 03/27/17  Yes Reather Littler, MD  metolazone (ZAROXOLYN) 5 MG tablet Take 1 tablet (5 mg total) by mouth daily. Patient taking differently: Take 5 mg by mouth 2 (two) times a week. Take Sunday and Thursday 02/01/17  Yes Penny Pia, MD  metoprolol succinate (TOPROL-XL) 25 MG 24 hr tablet Take 1 tablet by mouth daily. 12/30/16  Yes [provider]  OXYGEN 2 L by Continuous infusion (non-IV) route daily.    Yes [provider]  potassium chloride 20 MEQ/15ML (10%) SOLN Take 30 mEq by mouth 2 (two) times daily.  10/29/16  Yes [provider]  torsemide (DEMADEX) 20 MG tablet Take 1 tablet (20 mg total) by mouth 2 (two) times daily. 02/01/17  Yes Penny Pia, MD  metoprolol tartrate (LOPRESSOR) 25 MG tablet Take 0.5 tablets (12.5 mg total) by mouth 2 (two) times daily. Patient not taking: Reported on 04/06/2017 01/30/17   Penny Pia, MD   Past Medical History: Past Medical History:  Diagnosis Date  . Anxiety   . Arthritis    "throughout my body" (04/06/2017)  . Asthma   . Bipolar disorder (HCC)   . CAD (coronary artery disease) 12/12/2014   -s/p 2V PCI of the LAD/RCA with taxus stents -cardiologist is Dr. Carmon Sails, [facility]   . CHF (congestive heart failure) (HCC)    sees Dr. Wynonia Hazard  . Chronic back pain    "all over"  . Cirrhosis of liver (HCC)    Hattie Perch 04/06/2017  . CKD (chronic kidney disease), stage III (HCC)    Hattie Perch 04/06/2017  . Depression   . Diabetes mellitus with renal complications (HCC)    PVD and retinopathy, S/p bilat ambutations  . Fibromyalgia   . Glaucoma, bilateral   . Headache     "severely, 2/wk; maybe 2 not as severe" (04/06/2017)  . Heart murmur   . High cholesterol   . History of blood transfusion 2013   "when I had my legs done"  . Liver cirrhosis (HCC)   . Lyme disease   . On home oxygen therapy    "2L; 24/7" (04/06/2017)  . Retinal detachment    sees Dr. Johna Sheriff per her report   Past Surgical History: Past Surgical History:  Procedure Laterality Date  . CORONARY ANGIOPLASTY WITH STENT PLACEMENT     "I have 2-3 stents" (04/06/2017)  . ESOPHAGOGASTRODUODENOSCOPY (EGD) WITH PROPOFOL N/A 07/07/2016   Procedure: ESOPHAGOGASTRODUODENOSCOPY (EGD) WITH PROPOFOL;  Surgeon: Rachael Fee, MD;  Location: [facility];  Service: Endoscopy;  Laterality: N/A;  . LASIK Bilateral   .  LEG AMPUTATION BELOW KNEE Bilateral 09/09/2011- 09/11/2011  . RETINAL DETACHMENT SURGERY Right    "tried to fix it; it didn't take"  . TONSILLECTOMY    . TUBAL LIGATION     Family History: Family History  Problem Relation Age of Onset  . Arthritis Mother   . Heart disease Mother   . Mental illness Mother   . Diabetes Mother   . Arthritis Maternal Grandmother   . Diabetes Maternal Grandmother   . Arthritis Father   . CVA Father   . Hypertension Father   . Sudden death Paternal Grandfather    Social History: Social History   Social History  . Marital status: Married    Spouse name: N/A  . Number of children: N/A  . Years of education: N/A   Occupational History  . Disabled    Social History Main Topics  . Smoking status: Never Smoker  . Smokeless tobacco: Never Used  . Alcohol use 0.0 oz/week     Comment: 04/06/2017 "maybe 4 times/year; holidays; 1 drink at each occasion"  . Drug use: No  . Sexual activity: Not Currently   Other Topics Concern  . None   Social History Narrative   Married   Husband is a Naval architect, he is home 3 days/month   Son and BFF live there and help her.      Allergies:  Allergies  Allergen Reactions  . Broccoli [Brassica  Oleracea Italica] Anaphylaxis  . Influenza Vaccines Anaphylaxis    Per patient  . Pneumococcal Vaccines Anaphylaxis    Per patient  . Trulicity [Dulaglutide] Hives and Nausea And Vomiting  . Amoxicillin Nausea And Vomiting  . Doxycycline Hives and Nausea And Vomiting  . Iron Other (See Comments)    GI intolerance  . Latex Other (See Comments)    blisters  . Lisinopril Nausea Only  . Penicillins Other (See Comments)    GI intolerance, UTI, can tolerate in IV Tolerates cephalosporins Has patient had a PCN reaction causing immediate rash, facial/tongue/throat swelling, SOB or lightheadedness with hypotension: No Has patient had a PCN reaction causing severe rash involving mucus membranes or skin necrosis: No Has patient had a PCN reaction that required hospitalization: No Has patient had a PCN reaction occurring within the last 10 years: No If all of the above answers are "NO", then may proceed with Cephalosporin   . Tape Other (See Comments)    Tears skin.  Please use "paper" tape  . Codeine Nausea And Vomiting and Rash  . Sulfa Antibiotics Nausea And Vomiting and Rash  . Sulfur Nausea And Vomiting and Rash   Objective:    Vital Signs:   Temp:  [97.5 F (36.4 C)-97.6 F (36.4 C)] 97.5 F (36.4 C) (10/15 0523) Pulse Rate:  [63-72] 72 (10/15 1134) Resp:  [18] 18 (10/15 0523) BP: (92-106)/(44-61) 99/44 (10/15 1134) SpO2:  [92 %-99 %] 93 % (10/15 1134) Weight:  [243 lb 8 oz (110.5 kg)] 243 lb 8 oz (110.5 kg) (10/15 0300) Last BM Date: 04/10/17  Weight change: Filed Weights   04/08/17 0529 04/09/17 0654 04/10/17 0300  Weight: 246 lb 1.6 oz (111.6 kg) 246 lb 4.8 oz (111.7 kg) 243 lb 8 oz (110.5 kg)   Intake/Output:   Intake/Output Summary (Last 24 hours) at 04/10/17 1326 Last data filed at 04/10/17 0911  Gross per 24 hour  Intake              840 ml  Output  550 ml  Net              290 ml   Physical Exam    General:  Chronically ill and fatigue appearing.  NAD.  HEENT: Disconjugate gaze. Legally blind.  Neck: supple. JVP to jaw. Carotids 2+ bilat; no bruits. No lymphadenopathy or thyromegaly appreciated. Cor: PMI nondisplaced. Regular rate & rhythm. No rubs, gallops or murmurs. Lungs: Diminished basilar sounds.  Abdomen: Obese, tender, tight, prominent superficial vascularity. Hepatosplenomegaly difficult to examine due to tenderness. +BS.  Extremities: Bilateral BKA. ? Cyanosis at finger tips and lips. 1+ edema in BKA sites. Hands cool to the touch.  Neuro: alert & orientedx3, cranial nerves grossly intact. moves all 4 extremities w/o difficulty. Affect pleasant  Telemetry   NSR, Personally reviewed  EKG    NSR 68 this am (04/10/17) QTc 504.  Labs   Basic Metabolic Panel:  Recent Labs Lab 04/06/17 1810 04/07/17 0520 04/08/17 0534 04/09/17 0426 04/10/17 0355  NA 137 137 138 139 137  K 3.0* 2.8* 3.7 4.9 4.5  CL 87* 87* 90* 91* 89*  CO2 39* 39* 35* 34* 36*  GLUCOSE 153* 119* 151* 163* 129*  BUN 36* 35* 42* 51* 58*  CREATININE 1.54* 1.51* 2.04* 2.64* 2.52*  CALCIUM 9.3 9.3 9.0 9.3 9.1  MG  --   --  2.2 2.3 2.4    Liver Function Tests:  Recent Labs Lab 04/06/17 1810 04/09/17 0426  AST 30 19  ALT 11* 10*  ALKPHOS 122 149*  BILITOT 5.2* 4.1*  PROT 7.0 7.2  ALBUMIN 3.3* 3.3*    Recent Labs Lab 04/06/17 1810  LIPASE 30   No results for input(s): AMMONIA in the last 168 hours.  CBC:  Recent Labs Lab 04/06/17 1810 04/08/17 0534 04/09/17 0426  WBC 4.4 5.0 5.2  NEUTROABS 3.5 3.7 3.9  HGB 12.6 11.1* 12.2  HCT 39.5 35.6* 39.0  MCV 99.7 100.6* 101.0*  PLT 74* 77* 161    Cardiac Enzymes: No results for input(s): CKTOTAL, CKMB, CKMBINDEX, TROPONINI in the last 168 hours.  BNP: BNP (last 3 results)  Recent Labs  01/22/17 1723 04/06/17 1810 04/09/17 1129  BNP 474.8* 455.6* 459.2*    ProBNP (last 3 results) No results for input(s): PROBNP in the last 8760 hours.   CBG:  Recent Labs Lab  04/09/17 2136 04/09/17 2220 04/09/17 2341 04/10/17 0756 04/10/17 1143  GLUCAP 63* 72 107* 86 127*    Coagulation Studies: No results for input(s): LABPROT, INR in the last 72 hours.   Imaging    No results found.   Medications:     Current Medications: . aspirin  81 mg Oral Daily  . atorvastatin  20 mg Oral QPM  . insulin aspart  35 Units Subcutaneous TID WC  . insulin degludec  30 Units Subcutaneous Q2200  . loperamide  2 mg Oral QAC breakfast  . metoCLOPramide  5 mg Oral TID AC  . metoprolol succinate  25 mg Oral Daily  . sodium chloride flush  3 mL Intravenous Q12H     Infusions: . sodium chloride    . milrinone       Patient Profile   Sue Martin is a 51 y.o. 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  Admitted with acute on chronic combined CHF with worsening SOB and edema. HF team asked to consult with initial mixed venous  saturation of 52% on PICC line.   Assessment/Plan   1. Acute on chronic combined systolic and diastolic CHF - Last EF 30-35% with G3DD - PICC placed this admission with difficult access and systolic CHF - Initial Coox 52%. Started on milrinone 0.25 mcg/kg/min - NYHA IIIb symptoms, but history and exam difficult due to immobility.  - Volume status remains markedly volume overloaded on exam. + Cardiorenal syndrome with worsening AKI.  - Recheck coox - Follow CVP. Suspect it will be markedly elevated. - Stop BB with concerns for low output.  - Agree with move to stepdown for CVP.  - Repeat Echo.  - Pt may benefit from RHC if continues to decline, but she is not candidate for any advanced therapies, as below.  - Pt is not a candidate for advanced therapies including LVAD or transplant with her significant co-morbidities. She would be a high risk candidate for home milrinone with her kidney disease.  2. AKI on CKD III-IV - Creatinine 1.54  on admit. Up to 2.52 with attempts to diurese.   - Component of cardiorenal syndrome.  - May need to involve renal. Do not think she would be a good candidate for iHD. I will let Dr. Marisue Humble know she is in.  - She may need additional pressor support. Consider dopamine if coox does not improve on milrinone alone.  3. Acute on chronic hypoxic respiratory failure - On chronic 02.  - Sats in 90s. Continue to follow. Repeat ABG as needed.  4. CAD with h/o PCI to LAD and RCA - No s/s of ischemia.    - Continue ASA, statin, BB, and imdur.  - Troponin negative this admission 5. DM-2 - Per IM.  6. PAD with Bilateral BKA - Wheelchair bound. Very limited.  7. Hepatic cirrhosis - With small volume ascites. Do not think this would be amenable to paracentesis.  8. Legal blindness - Further limits her from advanced therapy consideration.   Prognosis guarded. She is not a candidate for advanced therapies. May need to involve renal. Will try and keep pressures up to support renal perfusion.   Length of Stay: 4  Luane School  04/10/2017, 1:26 PM  Advanced Heart Failure Team Pager [phone] (M-F; 7a - 4p)  Please contact [facility] for night-coverage after hours (4p -7a ) and weekends on [email or web address]  Patient seen and examined with the above-signed Advanced Practice Provider and/or Housestaff. I personally reviewed laboratory data, imaging studies and relevant notes. I independently examined the patient and formulated the important aspects of the plan. I have edited the note to reflect any of my changes or salient points. I have personally discussed the plan with the patient and/or family.  Very challenging case. 51 y/o woman with legal blindness, advanced DM2 with PAD and bilateral BKAs, morbid obesity, CAD and systolic heart failure due to iCM admitted with severe volume overload and AKI with R>>L HF.  Echo reviewed personally and EF ~30% with moderate RV dysfunction. PICC placed  and co-ox low with markedly elevated CVP and low co-ox. Have started milrinone and will increase lasix. Place Foley for 24-48 hours to help assess response. She is not candidate for advanced therapies. Noncompliance seems to be major issue as well.   Arvilla Meres, MD  9:49 PM

## 2017-04-10 NOTE — Progress Notes (Signed)
Pharmacy Consult for Milrinone (Primacor) Initiation  Indication:   Acute Decompensated Heart Failure with volume overload and low cardiac output  Allergies  Allergen Reactions  . Broccoli [Brassica Oleracea Italica] Anaphylaxis  . Influenza Vaccines Anaphylaxis    Per patient  . Pneumococcal Vaccines Anaphylaxis    Per patient  . Trulicity [Dulaglutide] Hives and Nausea And Vomiting  . Amoxicillin Nausea And Vomiting  . Doxycycline Hives and Nausea And Vomiting  . Iron Other (See Comments)    GI intolerance  . Latex Other (See Comments)    blisters  . Lisinopril Nausea Only  . Penicillins Other (See Comments)    GI intolerance, UTI, can tolerate in IV Tolerates cephalosporins Has patient had a PCN reaction causing immediate rash, facial/tongue/throat swelling, SOB or lightheadedness with hypotension: No Has patient had a PCN reaction causing severe rash involving mucus membranes or skin necrosis: No Has patient had a PCN reaction that required hospitalization: No Has patient had a PCN reaction occurring within the last 10 years: No If all of the above answers are "NO", then may proceed with Cephalosporin   . Tape Other (See Comments)    Tears skin.  Please use "paper" tape  . Codeine Nausea And Vomiting and Rash  . Sulfa Antibiotics Nausea And Vomiting and Rash  . Sulfur Nausea And Vomiting and Rash    Temp:  [97.5 F (36.4 C)-97.6 F (36.4 C)] 97.5 F (36.4 C) (10/15 0523) Pulse Rate:  [63-72] 72 (10/15 1134) Cardiac Rhythm: Heart block (10/15 0753) Resp:  [18] 18 (10/15 0523) BP: (92-106)/(44-61) 99/44 (10/15 1134) SpO2:  [92 %-99 %] 93 % (10/15 1134) Weight:  [243 lb 8 oz (110.5 kg)] 243 lb 8 oz (110.5 kg) (10/15 0300)  LABS    Component Value Date/Time   NA 137 04/10/2017 0355   K 4.5 04/10/2017 0355   CL 89 (L) 04/10/2017 0355   CO2 36 (H) 04/10/2017 0355   GLUCOSE 129 (H) 04/10/2017 0355   BUN 58 (H) 04/10/2017 0355   CREATININE 2.52 (H) 04/10/2017 0355    CALCIUM 9.1 04/10/2017 0355   GFRNONAA 21 (L) 04/10/2017 0355   GFRAA 24 (L) 04/10/2017 0355   Last magnesium:  Lab Results  Component Value Date   MG 2.4 04/10/2017   Estimated Creatinine Clearance: 27.2 mL/min (A) (by C-G formula based on SCr of 2.52 mg/dL (H)). Serum creatinine: 2.52 mg/dL (H) 16/10/96 0454 Estimated creatinine clearance: 27.2 mL/min (A) estimated creatinine clearance is 27.2 mL/min (A) (by C-G formula based on SCr of 2.52 mg/dL (H)).   Intake/Output Summary (Last 24 hours) at 04/10/17 1252 Last data filed at 04/10/17 0911  Gross per 24 hour  Intake             1080 ml  Output              550 ml  Net              530 ml    Filed Weights   04/08/17 0529 04/09/17 0654 04/10/17 0300  Weight: 246 lb 1.6 oz (111.6 kg) 246 lb 4.8 oz (111.7 kg) 243 lb 8 oz (110.5 kg)    Assessment:   51 y.o. female with acute decompensated congestive heart failure to be initiated on milrinone. She has been on lasix  IV q12h since 10/12. Patient with EF 30-35% and co-ox 52% on 10/14 -K= 4.5, Mg= 2.4 -total output ~ 1L since admit but overall ~ 1L positive  Plan:  1.  Initiate milrinone based on renal function: (Consider starting dose of 0.125 - 0.25 for patients with SBP <127mmHg) [employer] Calculated CrCl Dose   > 50 ml/min 0.375 mcg/kg/min   20-49 ml/min 0.250 mcg/kg/min   < 20 ml/min 0.125 mcg/kg/min   2. Nursing to monitor vital signs per milrinone protocol and physician parameters. 3. Pharmacy to follow peripherally, please reconsult if needed or there is further questions. 4.  Please contact MD for further dosing instructions.  Thank you for allowing [place] to be a part of this patient's care.  Harland German, Pharm D 04/10/2017 12:54 PM

## 2017-04-10 NOTE — Progress Notes (Signed)
   Repeat Coox 59.5% on Milrinone 0.25 mcg/kg/min.   CVP 25. Will start on lasix 120 mg TID.   Discussed with Dr. Gala Romney who agrees.   Casimiro Needle [address]" [place], PA-C 04/10/2017 4:20 PM

## 2017-04-10 NOTE — Progress Notes (Signed)
[facility] Progress Note    Since last GI note: Several loose stools in past 24 hours which is unusual for her. Eating solids.  Objective: Vital signs in last 24 hours: Temp:  [97.5 F (36.4 C)-97.6 F (36.4 C)] 97.5 F (36.4 C) (10/15 0523) Pulse Rate:  [63-68] 68 (10/15 0523) Resp:  [18] 18 (10/15 0523) BP: (92-106)/(50-61) 106/61 (10/15 0523) SpO2:  [92 %-99 %] 92 % (10/15 0523) Weight:  [243 lb 8 oz (110.5 kg)] 243 lb 8 oz (110.5 kg) (10/15 0300) Last BM Date: 04/10/17 General: alert and oriented times 3 Heart: regular rate and rythm Abdomen: soft, non-tender, non-distended, normal bowel sounds; no obvious ascites.   Lab Results:  Recent Labs  04/08/17 0534 04/09/17 0426  WBC 5.0 5.2  HGB 11.1* 12.2  PLT 77* 161  MCV 100.6* 101.0*    Recent Labs  04/08/17 0534 04/09/17 0426 04/10/17 0355  NA 138 139 137  K 3.7 4.9 4.5  CL 90* 91* 89*  CO2 35* 34* 36*  GLUCOSE 151* 163* 129*  BUN 42* 51* 58*  CREATININE 2.04* 2.64* 2.52*  CALCIUM 9.0 9.3 9.1    Recent Labs  04/09/17 0426  PROT 7.2  ALBUMIN 3.3*  AST 19  ALT 10*  ALKPHOS 149*  BILITOT 4.1*   No results for input(s): INR in the last 72 hours.   Studies/Results: Dg Chest 2 View  Result Date: 04/09/2017 CLINICAL DATA:  Follow-up of congestive heart failure. Improving shortness of breath. EXAM: CHEST  2 VIEW COMPARISON:  04/06/2017 FINDINGS: Right-sided PICC line terminates at the expected location of the cavoatrial junction. Enlarged cardiac silhouette.  Mediastinal contours appear intact. There is no evidence of focal airspace consolidation, pleural effusion or pneumothorax. Mild improvement in pulmonary edema. Osseous structures are without acute abnormality. Soft tissues are grossly normal. IMPRESSION: Mild improvement in interstitial pulmonary edema. Enlarged cardiac silhouette. Electronically Signed   By: Ted Mcalpine M.D.   On: 04/09/2017 11:45      Medications: Scheduled Meds: . aspirin  325 mg Oral QHS  . atorvastatin  20 mg Oral QPM  . insulin aspart  35 Units Subcutaneous TID WC  . insulin degludec  30 Units Subcutaneous Q2200  . metoCLOPramide  5 mg Oral TID AC  . metoprolol succinate  25 mg Oral Daily  . sodium chloride flush  3 mL Intravenous Q12H   Continuous Infusions: . sodium chloride     PRN Meds:.sodium chloride, acetaminophen, ondansetron (ZOFRAN) IV, sodium chloride flush, sodium chloride flush    Assessment/Plan: 51 y.o. female with cirrhosis, CHF exacerbation  Diuresis per cardiology. Cr is rising so this is obviously a delicate situation.  No sign of decompensated liver disease currently.  Will send stool for C. Diff testing and start a single imodium every morning (only hold if she becomes constipated).     Rachael Fee, MD  04/10/2017, 9:50 AM [facility] Pager [phone]

## 2017-04-10 NOTE — Progress Notes (Signed)
Requested bed for pt transfer, awaiting assignment Pt resting in bed, no complaints of shortness of breath

## 2017-04-10 NOTE — Progress Notes (Signed)
MD Turner gave verbal orders for ABG and coox stat Paged MD regarding results of ABG IV team at bedside now drawing cooxemetry

## 2017-04-10 NOTE — Progress Notes (Signed)
Called MD and pt placement per RRT to verify ICU bed MD stated she would prefer pt to be placed on 2 Heart  RRT and pt placement aware Awaiting bed assignent

## 2017-04-10 NOTE — Progress Notes (Signed)
PA Mardelle Matte stated okay to transfer to 2 Heart

## 2017-04-10 NOTE — Progress Notes (Signed)
Progress Note  Patient Name: Sue Martin Date of Encounter: 04/10/2017  Primary Cardiologist: Dr. Gwen Her, [facility]  Subjective   Still feels volume overloaded no chest pain, the pressure she had on admit has resolved  Inpatient Medications    Scheduled Meds: . aspirin  325 mg Oral QHS  . atorvastatin  20 mg Oral QPM  . insulin aspart  35 Units Subcutaneous TID WC  . insulin degludec  30 Units Subcutaneous Q2200  . metoCLOPramide  5 mg Oral TID AC  . metoprolol succinate  25 mg Oral Daily  . sodium chloride flush  3 mL Intravenous Q12H   Continuous Infusions: . sodium chloride     PRN Meds: sodium chloride, acetaminophen, ondansetron (ZOFRAN) IV, sodium chloride flush, sodium chloride flush   Vital Signs    Vitals:   04/09/17 1300 04/09/17 2041 04/10/17 0300 04/10/17 0523  BP: (!) 101/57 (!) 92/50  106/61  Pulse: 63 63  68  Resp: Temp: (!) 97.5 F (36.4 C) 97.6 F (36.4 C)  (!) 97.5 F (36.4 C)  TempSrc: Oral Oral  Oral  SpO2: 98% 99%  92%  Weight:   243 lb 8 oz (110.5 kg)   Height:        Intake/Output Summary (Last 24 hours) at 04/10/17 0752 Last data filed at 04/10/17 0451  Gross per 24 hour  Intake             1083 ml  Output              851 ml  Net              232 ml   Filed Weights   04/08/17 0529 04/09/17 0654 04/10/17 0300  Weight: 246 lb 1.6 oz (111.6 kg) 246 lb 4.8 oz (111.7 kg) 243 lb 8 oz (110.5 kg)    Telemetry    SR prolonged Qtc  - Personally Reviewed  ECG    No new, will order a new one - Personally Reviewed  Physical Exam   GEN: No acute distress.   Neck: No JVD Cardiac: RRR, no murmurs, rubs, or gallops.  Respiratory: Clear to auscultation bilaterally. GI: Soft, + BS,  nontender, non-distended, mild edema in tissues  MS: +1 edema of stumps; Bil BKA. Neuro:  Nonfocal  Psych: Normal affect   Labs    Chemistry Recent Labs Lab 04/06/17 1810  04/08/17 0534 04/09/17 0426 04/10/17 0355    NA 137  < > 138 139 137  K 3.0*  < > 3.7 4.9 4.5  CL 87*  < > 90* 91* 89*  CO2 39*  < > 35* 34* 36*  GLUCOSE 153*  < > 151* 163* 129*  BUN 36*  < > 42* 51* 58*  CREATININE 1.54*  < > 2.04* 2.64* 2.52*  CALCIUM 9.3  < > 9.0 9.3 9.1  PROT 7.0  --   --  7.2  --   ALBUMIN 3.3*  --   --  3.3*  --   AST 30  --   --  19  --   ALT 11*  --   --  10*  --   ALKPHOS 122  --   --  149*  --   BILITOT 5.2*  --   --  4.1*  --   GFRNONAA 38*  < > 27* 20* 21*  GFRAA 44*  < > 32* 23* 24*  ANIONGAP 11  < > 13  14 12  < > = values in this interval not displayed.   Hematology Recent Labs Lab 04/06/17 1810 04/08/17 0534 04/09/17 0426  WBC 4.4 5.0 5.2  RBC 3.96 3.54* 3.86*  HGB 12.6 11.1* 12.2  HCT 39.5 35.6* 39.0  MCV 99.7 100.6* 101.0*  MCH 31.8 31.4 31.6  MCHC 31.9 31.2 31.3  RDW 16.1* 16.1* 16.5*  PLT 74* 77* 161    Cardiac EnzymesNo results for input(s): TROPONINI in the last 168 hours.  Recent Labs Lab 04/06/17 1821  TROPIPOC 0.04     BNP Recent Labs Lab 04/06/17 1810 04/09/17 1129  BNP 455.6* 459.2*     DDimer No results for input(s): DDIMER in the last 168 hours.   Radiology    Dg Chest 2 View  Result Date: 04/09/2017 CLINICAL DATA:  Follow-up of congestive heart failure. Improving shortness of breath. EXAM: CHEST  2 VIEW COMPARISON:  04/06/2017 FINDINGS: Right-sided PICC line terminates at the expected location of the cavoatrial junction. Enlarged cardiac silhouette.  Mediastinal contours appear intact. There is no evidence of focal airspace consolidation, pleural effusion or pneumothorax. Mild improvement in pulmonary edema. Osseous structures are without acute abnormality. Soft tissues are grossly normal. IMPRESSION: Mild improvement in interstitial pulmonary edema. Enlarged cardiac silhouette. Electronically Signed   By: Ted Mcalpine M.D.   On: 04/09/2017 11:45    Cardiac Studies   ECHO from 01/23/17 Study Conclusions  - Left ventricle: The cavity size  was normal. Wall thickness was   normal. Systolic function was moderately to severely reduced. The   estimated ejection fraction was in the range of 30% to 35%.   Diffuse hypokinesis. There is akinesis of the apical myocardium.   Doppler parameters are consistent with a reversible restrictive   pattern, indicative of decreased left ventricular diastolic   compliance and/or increased left atrial pressure (grade 3   diastolic dysfunction). - Left atrium: The atrium was mildly dilated. - Pulmonic valve: There was moderate regurgitation.  Patient Profile     51 y.o. female with a hx of bilateral BKA, CHF EF 35%, CAD, DM, cirrhosis of the liver, CKD, HTN who is being followed for CHF.     Assessment & Plan    Acute on chronic systolic and diastolic HF.   EF is 30-35% G3DD.  Dry wt is 242 (wt 260 on admit if correct)  Today 243 She is up 942 ml since admit and wt is down 1 lb.  BNP on admit 455 yesterday 459 --co-ox 52 --CXR yesterday with mild improvement in interstitial pulmonary edema --1+ edema continues   CKD stage 4 Cr 1.51 on admit and pk 2.64, today 2.52  Lasix/metolazone  held yesterday - ? Resume po lasix today  CAD with hx PCI to LAD and RCA, on ASA, statin BB isosorbide.  Troponin neg  DM-2 insulin dependent - per IM  PAD with bil BKA.  wheelchair bound   Hepatic cirrhosis with small volume ascites  Atrial fib - initial EKG noted as a fib but P waves noted - SR with PVCs and Junctional escape beats not A fib  Prolonged Qtc will check EKG    For questions or updates, please contact [facility] Please consult [email or web address] for contact info under Cardiology/STEMI.      Signed, Nada Boozer, NP  04/10/2017, 7:52 AM

## 2017-04-10 NOTE — Telephone Encounter (Signed)
Called patient and left a voice message to let her know to have her doctor send a consult request to Dr. Lucianne Muss so he can help treat her while in the hospital.

## 2017-04-10 NOTE — Care Management Important Message (Signed)
Important Message  Patient Details  Name: Sue Martin MRN: [id number] Date of Birth: [birth year]   Medicare Important Message Given:  Yes    Ryon Layton 04/10/2017, 1:24 PM

## 2017-04-11 ENCOUNTER — Inpatient Hospital Stay (HOSPITAL_COMMUNITY): Payer: Medicare HMO

## 2017-04-11 DIAGNOSIS — I5023 Acute on chronic systolic (congestive) heart failure: Secondary | ICD-10-CM

## 2017-04-11 DIAGNOSIS — R197 Diarrhea, unspecified: Secondary | ICD-10-CM

## 2017-04-11 LAB — CBC WITH DIFFERENTIAL/PLATELET
BASOS ABS: 0 10*3/uL (ref 0.0–0.1)
Basophils Relative: 1 %
Eosinophils Absolute: 0.1 10*3/uL (ref 0.0–0.7)
Eosinophils Relative: 2 %
HEMATOCRIT: 35.5 % — AB (ref 36.0–46.0)
Hemoglobin: 11 g/dL — ABNORMAL LOW (ref 12.0–15.0)
LYMPHS PCT: 9 %
Lymphs Abs: 0.5 10*3/uL — ABNORMAL LOW (ref 0.7–4.0)
MCH: 31.4 pg (ref 26.0–34.0)
MCHC: 31 g/dL (ref 30.0–36.0)
MCV: 101.4 fL — AB (ref 78.0–100.0)
MONO ABS: 0.6 10*3/uL (ref 0.1–1.0)
Monocytes Relative: 10 %
NEUTROS ABS: 4.8 10*3/uL (ref 1.7–7.7)
Neutrophils Relative %: 79 %
Platelets: 83 10*3/uL — ABNORMAL LOW (ref 150–400)
RBC: 3.5 MIL/uL — AB (ref 3.87–5.11)
RDW: 16.3 % — ABNORMAL HIGH (ref 11.5–15.5)
WBC: 6 10*3/uL (ref 4.0–10.5)

## 2017-04-11 LAB — ECHOCARDIOGRAM COMPLETE
AOASC: 28 cm
Area-P 1/2: 3.55 cm2
EERAT: 17.2
EWDT: 211 ms
FS: 30 % (ref 28–44)
HEIGHTINCHES: 55 in
IVS/LV PW RATIO, ED: 0.83
LA ID, A-P, ES: 41 mm
LA diam end sys: 41 mm
LA diam index: 1.86 cm/m2
LA vol A4C: 43.6 ml
LV PW d: 12 mm — AB (ref 0.6–1.1)
LV TDI E'MEDIAL: 4.47
LVEEAVG: 17.2
LVEEMED: 17.2
LVELAT: 7.15 cm/s
LVOT area: 3.14 cm2
LVOT diameter: 20 mm
Lateral S' vel: 4.28 cm/s
MV Dec: 211
MV pk A vel: 52.4 m/s
MV pk E vel: 123 m/s
MVPG: 6 mmHg
MVSPHT: 62 ms
PV Reg vel dias: 88.2 cm/s
RV TAPSE: 10.7 mm
TDI e' lateral: 7.15
WEIGHTICAEL: 4042.35 [oz_av]

## 2017-04-11 LAB — GLUCOSE, CAPILLARY
GLUCOSE-CAPILLARY: 182 mg/dL — AB (ref 65–99)
GLUCOSE-CAPILLARY: 202 mg/dL — AB (ref 65–99)
GLUCOSE-CAPILLARY: 251 mg/dL — AB (ref 65–99)
Glucose-Capillary: 165 mg/dL — ABNORMAL HIGH (ref 65–99)
Glucose-Capillary: 268 mg/dL — ABNORMAL HIGH (ref 65–99)
Glucose-Capillary: 249 mg/dL — ABNORMAL HIGH (ref 65–99)

## 2017-04-11 LAB — BASIC METABOLIC PANEL
ANION GAP: 12 (ref 5–15)
BUN: 58 mg/dL — ABNORMAL HIGH (ref 6–20)
CALCIUM: 9.1 mg/dL (ref 8.9–10.3)
CO2: 35 mmol/L — ABNORMAL HIGH (ref 22–32)
Chloride: 91 mmol/L — ABNORMAL LOW (ref 101–111)
Creatinine, Ser: 2.49 mg/dL — ABNORMAL HIGH (ref 0.44–1.00)
GFR, EST AFRICAN AMERICAN: 25 mL/min — AB (ref >60)
GFR, EST NON AFRICAN AMERICAN: 21 mL/min — AB (ref >60)
Glucose, Bld: 187 mg/dL — ABNORMAL HIGH (ref 65–99)
Potassium: 3.5 mmol/L (ref 3.5–5.1)
SODIUM: 138 mmol/L (ref 135–145)

## 2017-04-11 LAB — COOXEMETRY PANEL
CARBOXYHEMOGLOBIN: 1.9 % — AB (ref 0.5–1.5)
METHEMOGLOBIN: 1.2 % (ref 0.0–1.5)
O2 Saturation: 72.9 %
Total hemoglobin: 11.3 g/dL — ABNORMAL LOW (ref 12.0–16.0)

## 2017-04-11 LAB — MAGNESIUM: MAGNESIUM: 2.3 mg/dL (ref 1.7–2.4)

## 2017-04-11 MED ORDER — HYDROCODONE-ACETAMINOPHEN 5-325 MG PO TABS
1.0000 | ORAL_TABLET | Freq: Once | ORAL | Status: AC
  Administered 2017-04-11: 1 via ORAL
  Filled 2017-04-11: qty 1

## 2017-04-11 MED ORDER — POTASSIUM CHLORIDE CRYS ER 20 MEQ PO TBCR
40.0000 meq | EXTENDED_RELEASE_TABLET | ORAL | Status: AC
  Administered 2017-04-11 (×2): 40 meq via ORAL
  Filled 2017-04-11 (×2): qty 2

## 2017-04-11 MED ORDER — CYCLOBENZAPRINE HCL 10 MG PO TABS
5.0000 mg | ORAL_TABLET | Freq: Two times a day (BID) | ORAL | Status: DC | PRN
  Administered 2017-04-18: 5 mg via ORAL
  Filled 2017-04-11 (×2): qty 1

## 2017-04-11 MED ORDER — MUPIROCIN 2 % EX OINT
1.0000 "application " | TOPICAL_OINTMENT | Freq: Two times a day (BID) | CUTANEOUS | Status: AC
  Administered 2017-04-11 – 2017-04-15 (×10): 1 via NASAL
  Filled 2017-04-11: qty 22

## 2017-04-11 MED ORDER — CHLORHEXIDINE GLUCONATE CLOTH 2 % EX PADS
6.0000 | MEDICATED_PAD | Freq: Every day | CUTANEOUS | Status: DC
  Administered 2017-04-11 – 2017-04-18 (×7): 6 via TOPICAL

## 2017-04-11 MED ORDER — ACETAZOLAMIDE 250 MG PO TABS
500.0000 mg | ORAL_TABLET | Freq: Two times a day (BID) | ORAL | Status: DC
  Administered 2017-04-11 – 2017-04-19 (×15): 500 mg via ORAL
  Filled 2017-04-11 (×16): qty 2

## 2017-04-11 MED ORDER — PERFLUTREN LIPID MICROSPHERE
1.0000 mL | INTRAVENOUS | Status: AC | PRN
  Administered 2017-04-11: 6 mL via INTRAVENOUS
  Filled 2017-04-11 (×2): qty 10

## 2017-04-11 NOTE — Progress Notes (Signed)
Advanced Heart Failure Rounding Note  PCP:  Primary Cardiologist: Dr Wynonia Hazard  Subjective:    Admitted with acute on chronic combined CHF with worsening SOB and edema. HF team asked to consult with initial mixed venous saturation of 52% on PICC line.   Yesterday moved to ICU for marked volume overload and low output HF. Started on milrinone 0.25 mcg started and lasix 120 mg three times a day started (only received 120 mg x1). Negative 1.3 liters. Weight up?    Todays CO-OX is 73%.   SOB with exertion. Denies SOB at rest. Complaining of back pain.    Objective:   Weight Range: 252 lb 10.4 oz (114.6 kg) Body mass index is 58.72 kg/m.   Vital Signs:   Temp:  [97.3 F (36.3 C)-97.9 F (36.6 C)] 97.3 F (36.3 C) (10/16 0000) Pulse Rate:  [26-79] 76 (10/16 0700) Resp:  [16-32] 18 (10/16 0700) BP: (88-125)/(41-92) 103/61 (10/16 0700) SpO2:  [89 %-100 %] 91 % (10/16 0700) Weight:  [252 lb 10.4 oz (114.6 kg)] 252 lb 10.4 oz (114.6 kg) (10/16 0500) Last BM Date: 04/10/17  Weight change: Filed Weights   04/09/17 0654 04/10/17 0300 04/11/17 0500  Weight: 246 lb 4.8 oz (111.7 kg) 243 lb 8 oz (110.5 kg) 252 lb 10.4 oz (114.6 kg)    Intake/Output:   Intake/Output Summary (Last 24 hours) at 04/11/17 0748 Last data filed at 04/11/17 0700  Gross per 24 hour  Intake            289.9 ml  Output             1655 ml  Net          -1365.1 ml      Physical Exam    General:  Chronically ill. No resp difficulty HEENT: Normal Neck: Supple. JVP to jaw  . Carotids 2+ bilat; no bruits. No lymphadenopathy or thyromegaly appreciated. Cor: PMI nondisplaced. Regular rate & rhythm. No rubs, gallops or murmurs. Lungs: Decreased in the bases. 5 liters oxygen.  Abdomen: Soft, nontender, + distended. No hepatosplenomegaly. No bruits or masses. Good bowel sounds. Extremities: No cyanosis, clubbing, rash, edema. bilateral BKA. RUE PICC with old blood  Around insertion and under dressing.     Neuro: Alert & orientedx3, cranial nerves grossly intact. moves all 4 extremities w/o difficulty. Affect pleasant GU: foley.    Telemetry  NSR 70s with PVCs . Personally reviewed.     EKG   NSR 68 this am (04/10/17) QTc 504.   Labs    CBC  Recent Labs  04/09/17 0426 04/11/17 0457  WBC 5.2 6.0  NEUTROABS 3.9 4.8  HGB 12.2 11.0*  HCT 39.0 35.5*  MCV 101.0* 101.4*  PLT 161 83*   Basic Metabolic Panel  Recent Labs  04/10/17 0355 04/11/17 0457  NA 137 138  K 4.5 3.5  CL 89* 91*  CO2 36* 35*  GLUCOSE 129* 187*  BUN 58* 58*  CREATININE 2.52* 2.49*  CALCIUM 9.1 9.1  MG 2.4 2.3   Liver Function Tests  Recent Labs  04/09/17 0426  AST 19  ALT 10*  ALKPHOS 149*  BILITOT 4.1*  PROT 7.2  ALBUMIN 3.3*   No results for input(s): LIPASE, AMYLASE in the last 72 hours. Cardiac Enzymes No results for input(s): CKTOTAL, CKMB, CKMBINDEX, TROPONINI in the last 72 hours.  BNP: BNP (last 3 results)  Recent Labs  01/22/17 1723 04/06/17 1810 04/09/17 1129  BNP 474.8* 455.6* 459.2*  ProBNP (last 3 results) No results for input(s): PROBNP in the last 8760 hours.   D-Dimer No results for input(s): DDIMER in the last 72 hours. Hemoglobin A1C No results for input(s): HGBA1C in the last 72 hours. Fasting Lipid Panel No results for input(s): CHOL, HDL, LDLCALC, TRIG, CHOLHDL, LDLDIRECT in the last 72 hours. Thyroid Function Tests No results for input(s): TSH, T4TOTAL, T3FREE, THYROIDAB in the last 72 hours.  Invalid input(s): FREET3  Other results:   Imaging     No results found.   Medications:     Scheduled Medications: . aspirin  81 mg Oral Daily  . atorvastatin  20 mg Oral QPM  . insulin aspart  0-20 Units Subcutaneous Q4H  . insulin degludec  30 Units Subcutaneous Q2200  . loperamide  2 mg Oral QAC breakfast  . mouth rinse  15 mL Mouth Rinse BID  . metoCLOPramide  5 mg Oral TID AC  . sodium chloride flush  3 mL Intravenous Q12H      Infusions: . sodium chloride    . furosemide Stopped (04/10/17 2315)  . milrinone 0.25 mcg/kg/min (04/11/17 0700)     PRN Medications:  sodium chloride, acetaminophen, ondansetron (ZOFRAN) IV, sodium chloride flush, sodium chloride flush    Patient Profile   Sue Martin is a 51 y.o. 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  Admitted with acute on chronic combined CHF with worsening SOB and edema. HF team asked to consult with initial mixed venous saturation of 52% on PICC line.   Assessment/Plan   1. A/C Combined Systolic/Diastolic Heart Failure - ECHO 02/6044 EF 35%. Grade III DD. -Repeat ECHO today.  -Todays CO-OX is 73% on milrinone 0.25 mcg.  -Volume status elevated. Continue 120 mg IV lasix three times a day. Only received one dose of 120 mg on 10/15 so will need to see how she responds today. May need to add metolazone.  No BB with low output.  - No arb/spiro/dig with AKI 2. AKI on CKD Stage III/IV- Creatinine 2.0>2.6>2.5>2.4. Urine output picking up.  3. A/C Hypoxic Respiratory Failure  On chronic oxygen. On 5 liters today.  4. CAD- PCI LAD and RCA.  No S/S  Ischemia.  Not on bb with low output hf. Continue asa and statin.  5. DMII- on sliding scale.  6. Cirrhosis- GI following. No plan for paracentesis 7. 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    Length of Stay: 5   Amy Clegg, NP  04/11/2017, 7:48 AM  Advanced Heart Failure Team Pager [phone] (M-F; 7a - 4p)  Please contact [facility] for night-coverage after hours (4p -7a ) and weekends on [email or web address]  Patient seen and examined with Tonye Becket, NP. We discussed all aspects of the encounter. I agree with the assessment and plan as stated above.   Volume status markedly elevated with R>L HF and cirrhosis. She has had very mild response to high-dose IV  lasix and metolazone. Co-ox ok on milrinone. Will add acetazolamide. Echo reviewed personally EF 30-35% RV moderately HK.   Arvilla Meres, MD  4:41 PM

## 2017-04-11 NOTE — Progress Notes (Signed)
Patient ID: Sue Martin, female   DOB: 1966-02-01, 51 y.o.   MRN: [id number] Patient has been transferred to ICU under Heart failure/Cardiology team and is currently on a Milrinone drip. Further management is as per primary team. Once patient is stable to come out of ICU, please contact the Flow manager of the hospitalist team [phone]) and a hospitalist will then be reassigned to the patient care.

## 2017-04-11 NOTE — Care Management Note (Signed)
Case Management Note Original Note Created Sherrilyn Rist [phone] 04/07/2017, 12:31 PM   Patient Details  Name: Sue Martin MRN: [id number] Date of Birth: July 17, 1965  Subjective/Objective:     CHF              Action/Plan: Patient lives at home with her son; PCP: Lucretia Kern, DO; has private insurance with [facility] Medicare with prescription drug coverage; bilateral amputee, wheel chair and home oxygen at home; CM following for DCP; [name]  Action/Plan: 01/30/17 Met with pt to discuss d/c needs. [name] with oxygen. Pt has no other DME needs. This CM explored a possible wider tub bench however this is not available locally. Pt will search online for this. [name] via [facility] with oxygen. [name] RN CM   Additional CM follow up notes:  04/11/17- 1030- [name] RN, CM- Pt  admitted with acute on chronic combined CHF with worsening SOB and edema. Yesterday moved to ICU for increased volume overload. Started on milrinone 0.25 mcg and lasix 120 mg three times a day IV - CM to continue to follow for d/c needs   Expected Discharge Date:                  Expected Discharge Plan:  Home/Self Care  In-House Referral:     Discharge planning Services  CM Consult  Post Acute Care Choice:    Choice offered to:     DME Arranged:    DME Agency:     HH Arranged:    HH Agency:     Status of Service:  In process, will continue to follow  If discussed at Long Length of Stay Meetings, dates discussed:    Discharge Disposition:   Additional Comments:  [name], RN 04/11/2017, 10:34 AM [phone]-- 2H coverage

## 2017-04-11 NOTE — Progress Notes (Signed)
[facility] Progress Note    Since last GI note: Diarrhea has resolved.  Stool testing was not sent because she starting having solid stool.   Heart failure team visited with her yesterday.  Objective: Vital signs in last 24 hours: Temp:  [97.3 F (36.3 C)-97.9 F (36.6 C)] 97.3 F (36.3 C) (10/16 0000) Pulse Rate:  [26-79] 73 (10/16 0600) Resp:  [16-32] 32 (10/16 0600) BP: (88-125)/(41-92) 102/61 (10/16 0600) SpO2:  [89 %-100 %] 91 % (10/16 0600) Weight:  [252 lb 10.4 oz (114.6 kg)] 252 lb 10.4 oz (114.6 kg) (10/16 0500) Last BM Date: 04/10/17 General: alert and oriented times 3 Heart: regular rate and rythm Abdomen: thickened, edematous skin thighs, abdomen.  Intake/Output Summary (Last 24 hours) at 04/11/17 0734 Last data filed at 04/11/17 0600  Gross per 24 hour  Intake            281.6 ml  Output             1655 ml  Net          -1373.4 ml     Lab Results:  Recent Labs  04/09/17 0426 04/11/17 0457  WBC 5.2 6.0  HGB 12.2 11.0*  PLT 161 83*  MCV 101.0* 101.4*    Recent Labs  04/09/17 0426 04/10/17 0355 04/11/17 0457  NA 139 137 138  K 4.9 4.5 3.5  CL 91* 89* 91*  CO2 34* 36* 35*  GLUCOSE 163* 129* 187*  BUN 51* 58* 58*  CREATININE 2.64* 2.52* 2.49*  CALCIUM 9.3 9.1 9.1    Recent Labs  04/09/17 0426  PROT 7.2  ALBUMIN 3.3*  AST 19  ALT 10*  ALKPHOS 149*  BILITOT 4.1*   Studies/Results: Dg Chest 2 View  Result Date: 04/09/2017 CLINICAL DATA:  Follow-up of congestive heart failure. Improving shortness of breath. EXAM: CHEST  2 VIEW COMPARISON:  04/06/2017 FINDINGS: Right-sided PICC line terminates at the expected location of the cavoatrial junction. Enlarged cardiac silhouette.  Mediastinal contours appear intact. There is no evidence of focal airspace consolidation, pleural effusion or pneumothorax. Mild improvement in pulmonary edema. Osseous structures are without acute abnormality. Soft tissues are grossly normal. IMPRESSION:  Mild improvement in interstitial pulmonary edema. Enlarged cardiac silhouette. Electronically Signed   By: Ted Mcalpine M.D.   On: 04/09/2017 11:45     Medications: Scheduled Meds: . aspirin  81 mg Oral Daily  . atorvastatin  20 mg Oral QPM  . insulin aspart  0-20 Units Subcutaneous Q4H  . insulin degludec  30 Units Subcutaneous Q2200  . loperamide  2 mg Oral QAC breakfast  . mouth rinse  15 mL Mouth Rinse BID  . metoCLOPramide  5 mg Oral TID AC  . sodium chloride flush  3 mL Intravenous Q12H   Continuous Infusions: . sodium chloride    . furosemide Stopped (04/10/17 2315)  . milrinone 0.25 mcg/kg/min (04/11/17 0600)   PRN Meds:.sodium chloride, acetaminophen, ondansetron (ZOFRAN) IV, sodium chloride flush, sodium chloride flush    Assessment/Plan: 51 y.o. female with cirrhosis, CHF, transient loose stools  Should continue once daily imodium on a scheduled basis and only hold if she becomes constipated.  Ascites is minimal on imaging. Diuresis per cardiology.   Rachael Fee, MD  04/11/2017, 7:32 AM [facility] Pager [phone]

## 2017-04-11 NOTE — Progress Notes (Signed)
  Echocardiogram 2D Echocardiogram has been performed.  Snyder Colavito G Lamoine Magallon 04/11/2017, 10:50 AM

## 2017-04-12 DIAGNOSIS — I471 Supraventricular tachycardia: Secondary | ICD-10-CM

## 2017-04-12 LAB — CBC
HCT: 35.2 % — ABNORMAL LOW (ref 36.0–46.0)
HEMOGLOBIN: 10.8 g/dL — AB (ref 12.0–15.0)
MCH: 31.5 pg (ref 26.0–34.0)
MCHC: 30.7 g/dL (ref 30.0–36.0)
MCV: 102.6 fL — AB (ref 78.0–100.0)
Platelets: 87 10*3/uL — ABNORMAL LOW (ref 150–400)
RBC: 3.43 MIL/uL — AB (ref 3.87–5.11)
RDW: 16.1 % — ABNORMAL HIGH (ref 11.5–15.5)
WBC: 6.5 10*3/uL (ref 4.0–10.5)

## 2017-04-12 LAB — GLUCOSE, CAPILLARY
GLUCOSE-CAPILLARY: 152 mg/dL — AB (ref 65–99)
GLUCOSE-CAPILLARY: 161 mg/dL — AB (ref 65–99)
GLUCOSE-CAPILLARY: 141 mg/dL — AB (ref 65–99)
GLUCOSE-CAPILLARY: 152 mg/dL — AB (ref 65–99)
Glucose-Capillary: 161 mg/dL — ABNORMAL HIGH (ref 65–99)

## 2017-04-12 LAB — BASIC METABOLIC PANEL
Anion gap: 11 (ref 5–15)
BUN: 54 mg/dL — AB (ref 6–20)
CHLORIDE: 92 mmol/L — AB (ref 101–111)
CO2: 34 mmol/L — ABNORMAL HIGH (ref 22–32)
CREATININE: 2.22 mg/dL — AB (ref 0.44–1.00)
Calcium: 8.6 mg/dL — ABNORMAL LOW (ref 8.9–10.3)
GFR calc non Af Amer: 25 mL/min — ABNORMAL LOW (ref >60)
GFR, EST AFRICAN AMERICAN: 29 mL/min — AB (ref >60)
Glucose, Bld: 244 mg/dL — ABNORMAL HIGH (ref 65–99)
POTASSIUM: 3.3 mmol/L — AB (ref 3.5–5.1)
SODIUM: 137 mmol/L (ref 135–145)

## 2017-04-12 LAB — URINALYSIS, MICROSCOPIC (REFLEX): Squamous Epithelial / LPF: NONE SEEN

## 2017-04-12 LAB — COOXEMETRY PANEL
Carboxyhemoglobin: 2.4 % — ABNORMAL HIGH (ref 0.5–1.5)
METHEMOGLOBIN: 0.8 % (ref 0.0–1.5)
O2 SAT: 74.6 %
TOTAL HEMOGLOBIN: 10.7 g/dL — AB (ref 12.0–16.0)

## 2017-04-12 LAB — POCT I-STAT 3, ART BLOOD GAS (G3+)
Acid-Base Excess: 12 mmol/L — ABNORMAL HIGH (ref 0.0–2.0)
BICARBONATE: 39.1 mmol/L — AB (ref 20.0–28.0)
O2 SAT: 85 %
PCO2 ART: 62.4 mmHg — AB (ref 32.0–48.0)
PH ART: 7.404 (ref 7.350–7.450)
PO2 ART: 51 mmHg — AB (ref 83.0–108.0)
Patient temperature: 97.8
TCO2: 41 mmol/L — ABNORMAL HIGH (ref 22–32)

## 2017-04-12 LAB — URINALYSIS, ROUTINE W REFLEX MICROSCOPIC
Bilirubin Urine: NEGATIVE
GLUCOSE, UA: NEGATIVE mg/dL
Ketones, ur: NEGATIVE mg/dL
Leukocytes, UA: NEGATIVE
Nitrite: NEGATIVE
Protein, ur: NEGATIVE mg/dL
SPECIFIC GRAVITY, URINE: 1.008 (ref 1.005–1.030)
pH: 7 (ref 5.0–8.0)

## 2017-04-12 LAB — MAGNESIUM: MAGNESIUM: 2.1 mg/dL (ref 1.7–2.4)

## 2017-04-12 LAB — AMMONIA: Ammonia: 80 umol/L — ABNORMAL HIGH (ref 9–35)

## 2017-04-12 MED ORDER — POTASSIUM CHLORIDE CRYS ER 20 MEQ PO TBCR: 60.0000 meq | EXTENDED_RELEASE_TABLET | Freq: Once | ORAL | Status: DC

## 2017-04-12 MED ORDER — METOPROLOL TARTRATE 5 MG/5ML IV SOLN
INTRAVENOUS | Status: AC
  Filled 2017-04-12: qty 10

## 2017-04-12 MED ORDER — ADENOSINE 6 MG/2ML IV SOLN
INTRAVENOUS | Status: AC
  Administered 2017-04-12: 6 mg via INTRAVENOUS
  Filled 2017-04-12: qty 2

## 2017-04-12 MED ORDER — POTASSIUM CHLORIDE CRYS ER 20 MEQ PO TBCR
60.0000 meq | EXTENDED_RELEASE_TABLET | Freq: Two times a day (BID) | ORAL | Status: DC
  Administered 2017-04-12 – 2017-04-15 (×8): 60 meq via ORAL
  Filled 2017-04-12 (×8): qty 3

## 2017-04-12 MED ORDER — METOPROLOL TARTRATE 5 MG/5ML IV SOLN
2.5000 mg | Freq: Once | INTRAVENOUS | Status: AC
  Administered 2017-04-12: 2.5 mg via INTRAVENOUS

## 2017-04-12 MED ORDER — INSULIN GLARGINE 100 UNIT/ML ~~LOC~~ SOLN
10.0000 [IU] | Freq: Every day | SUBCUTANEOUS | Status: DC
  Administered 2017-04-12 – 2017-04-13 (×2): 10 [IU] via SUBCUTANEOUS
  Filled 2017-04-12 (×3): qty 0.1

## 2017-04-12 MED ORDER — POTASSIUM CHLORIDE CRYS ER 20 MEQ PO TBCR: 40.0000 meq | EXTENDED_RELEASE_TABLET | Freq: Once | ORAL | Status: DC

## 2017-04-12 MED ORDER — ADENOSINE 6 MG/2ML IV SOLN
6.0000 mg | Freq: Once | INTRAVENOUS | Status: AC
  Administered 2017-04-12: 6 mg via INTRAVENOUS

## 2017-04-12 MED ORDER — ADENOSINE 6 MG/2ML IV SOLN
INTRAVENOUS | Status: AC
  Administered 2017-04-12: 3 mg via INTRAVENOUS
  Filled 2017-04-12: qty 2

## 2017-04-12 MED ORDER — ADENOSINE 6 MG/2ML IV SOLN
3.0000 mg | Freq: Once | INTRAVENOUS | Status: AC
  Administered 2017-04-12: 3 mg via INTRAVENOUS

## 2017-04-12 MED ORDER — POTASSIUM CHLORIDE CRYS ER 20 MEQ PO TBCR: 60.0000 meq | EXTENDED_RELEASE_TABLET | Freq: Every day | ORAL | Status: DC

## 2017-04-12 MED ORDER — POTASSIUM CHLORIDE 20 MEQ PO PACK
40.0000 meq | PACK | Freq: Once | ORAL | Status: AC
  Administered 2017-04-12: 40 meq via ORAL
  Filled 2017-04-12: qty 2

## 2017-04-12 MED ORDER — METOLAZONE 2.5 MG PO TABS
2.5000 mg | ORAL_TABLET | Freq: Once | ORAL | Status: AC
  Administered 2017-04-12: 2.5 mg via ORAL
  Filled 2017-04-12: qty 1

## 2017-04-12 NOTE — Progress Notes (Signed)
Cardiology fellow [facility](Chakravartti) in the unit. Spoke to him regarding patient and provided update.   Patient remains in NSR, but with frequent ectopy. Currently having frequent runs of bigeminy.   No orders received. Will continue to monitor and will check labs in the AM.

## 2017-04-12 NOTE — Progress Notes (Signed)
Advanced Heart Failure Rounding Note  PCP:  Primary Cardiologist: Dr Wynonia Hazard  Subjective:    Admitted with acute on chronic combined CHF with worsening SOB and edema. HF team asked to consult with initial mixed venous saturation of 52% on PICC line.   Yesterday diuresed with IV lasix, milrinone, and diamox. Negative 1.6 liters. Overnight developed SVT. Milrinone cut back to 0125 mcg. Received adenosine x2 and metoprolol x2. NSR restored.   SOB with exertion. Wants foley out.   Objective:   Weight Range: 255 lb 8.2 oz (115.9 kg) Body mass index is 59.39 kg/m.   Vital Signs:   Temp:  [97.6 F (36.4 C)-97.9 F (36.6 C)] 97.8 F (36.6 C) (10/17 0300) Pulse Rate:  [64-153] 78 (10/17 0700) Resp:  [8-28] 16 (10/17 0700) BP: (81-125)/(24-80) 83/50 (10/17 0700) SpO2:  [91 %-100 %] 100 % (10/17 0700) Weight:  [255 lb 8.2 oz (115.9 kg)] 255 lb 8.2 oz (115.9 kg) (10/17 0500) Last BM Date: 04/10/17  Weight change: Filed Weights   04/10/17 0300 04/11/17 0500 04/12/17 0500  Weight: 243 lb 8 oz (110.5 kg) 252 lb 10.4 oz (114.6 kg) 255 lb 8.2 oz (115.9 kg)    Intake/Output:   Intake/Output Summary (Last 24 hours) at 04/12/17 0757 Last data filed at 04/12/17 0600  Gross per 24 hour  Intake            719.6 ml  Output             2350 ml  Net          -1630.4 ml      Physical Exam   CVP 21 General:  Appears chronically ill. No resp difficulty HEENT: normal Neck: supple. JVP to jaw. Carotids 2+ bilat; no bruits. No lymphadenopathy or thryomegaly appreciated. Cor: PMI nondisplaced. Regular rate & rhythm. No rubs, gallops or murmurs. Lungs: Decreased in the bases on 5 liters [place]isher Abdomen: obese, soft, nontender, nondistended. No hepatosplenomegaly. No bruits or masses. Good bowel sounds. Extremities: no cyanosis, clubbing, rash, R and L BKA. RUE PICC  Neuro: alert & orientedx3, cranial nerves grossly intact. moves all 4 extremities w/o difficulty. Affect pleasant GU: Foley-  red Skin: no skin break down noted.     Telemetry  SVT over night. NSR 90s today. Personally reviewed.     EKG   NSR 68 this am (04/10/17) QTc 504.   Labs    CBC  Recent Labs  04/11/17 0457  WBC 6.0  NEUTROABS 4.8  HGB 11.0*  HCT 35.5*  MCV 101.4*  PLT 83*   Basic Metabolic Panel  Recent Labs  04/11/17 0457 04/12/17 0150  NA 138 137  K 3.5 3.3*  CL 91* 92*  CO2 35* 34*  GLUCOSE 187* 244*  BUN 58* 54*  CREATININE 2.49* 2.22*  CALCIUM 9.1 8.6*  MG 2.3 2.1   Liver Function Tests No results for input(s): AST, ALT, ALKPHOS, BILITOT, PROT, ALBUMIN in the last 72 hours. No results for input(s): LIPASE, AMYLASE in the last 72 hours. Cardiac Enzymes No results for input(s): CKTOTAL, CKMB, CKMBINDEX, TROPONINI in the last 72 hours.  BNP: BNP (last 3 results)  Recent Labs  01/22/17 1723 04/06/17 1810 04/09/17 1129  BNP 474.8* 455.6* 459.2*    ProBNP (last 3 results) No results for input(s): PROBNP in the last 8760 hours.   D-Dimer No results for input(s): DDIMER in the last 72 hours. Hemoglobin A1C No results for input(s): HGBA1C in the last 72 hours. Fasting  Lipid Panel No results for input(s): CHOL, HDL, LDLCALC, TRIG, CHOLHDL, LDLDIRECT in the last 72 hours. Thyroid Function Tests No results for input(s): TSH, T4TOTAL, T3FREE, THYROIDAB in the last 72 hours.  Invalid input(s): FREET3  Other results:   Imaging    No results found.   Medications:     Scheduled Medications: . acetaZOLAMIDE  500 mg Oral BID  . aspirin  81 mg Oral Daily  . atorvastatin  20 mg Oral QPM  . Chlorhexidine Gluconate Cloth  6 each Topical Daily  . insulin aspart  0-20 Units Subcutaneous Q4H  . insulin degludec  30 Units Subcutaneous Q2200  . loperamide  2 mg Oral QAC breakfast  . mouth rinse  15 mL Mouth Rinse BID  . metoCLOPramide  5 mg Oral TID AC  . metoprolol tartrate      . mupirocin ointment  1 application Nasal BID  . sodium chloride flush  3 mL  Intravenous Q12H    Infusions: . sodium chloride    . furosemide Stopped (04/11/17 2240)  . milrinone 0.125 mcg/kg/min (04/12/17 0322)    PRN Medications: sodium chloride, acetaminophen, cyclobenzaprine, ondansetron (ZOFRAN) IV, sodium chloride flush, sodium chloride flush    Patient Profile   Sue Martin is a 51 y.o. 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  Admitted with acute on chronic combined CHF with worsening SOB and edema. HF team asked to consult with initial mixed venous saturation of 52% on PICC line.   Assessment/Plan   1. A/C Combined Systolic/Diastolic Heart Failure - ECHO 1/61097/2018 EF 35%. Grade III DD. -Repeat ECHO today.  -Today CO-OX is 75%. Continue milrinone 0.125 mcg.  Improved urine output. CVP 21. Continue current diuretic regimen.  - Lasix 120 mg three times a day. Diamox 500 mg twice a day.  Add 2.5 mg metolazone today. -No BB with low output.  - No arb/spiro/dig with AKI 2. AKI on CKD Stage III/IV- Creatinine 2.0>2.6>2.5>2.4> 2.2.  Urine output improved.   3. A/C Hypoxic Respiratory Failure  On chronic oxygen. On 5 liters today.  4. CAD- PCI LAD and RCA.  No S/S  Ischemia.  Not on bb with low output hf. Continue asa and statin.  5. DMII- on sliding scale.  6. Cirrhosis- GI following. No plan for paracentesis 7. 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 Hematuria- check cbc. Check UA and urine culture. ? Foley trauma.  10. SVT  - resolved this am. Milrinone cut back. Keep Mg > 2.0 K > 4.0  She is requesting foley out. Discussed benefit to help with moisture and aggressive diuresis.   Length of Stay: 6   Amy Clegg, NP  04/12/2017, 7:57 AM  Advanced Heart Failure Team Pager [phone] (M-F; 7a - 4p)  Please contact [facility] for night-coverage after hours (4p -7a ) and weekends on [email or web address]  Patient seen and examined with Tonye BecketAmy Clegg, NP.  We discussed all aspects of the encounter. I agree with the assessment and plan as stated above.   Remains massively volume overloaded. Sluggish response to high dose diuretics. CKD stable.  Will add metolazone. If no significant improvement can switch to lasix gtt. Had SVT overnight. Milrinone cut back. Watch electrolytes.

## 2017-04-12 NOTE — Progress Notes (Signed)
Mrs Legrand ComoMcKague went into SVT this night. Sustained, rates of 140-150. She is symptomatic. IN fast rhythm she has ST changes suggesting ischemia. No ischmia in NSR.  Triggered by a high burden of PAC. PAC burden went up after milrinone initiation.   Adenosine broke SVT with underlying NSR. So not Aflutter.   She remains volume up. HypoK noted on labs.  Plan: - gentle repletion of K - drop milrinone to 0.125 - repeat adenosine was attempted but will give very low dose metop iv to maintain long term SVT control. Metop 2.5mg  iv oce. Ca repeat I future but obviously need to be very careful given advanced HF.

## 2017-04-12 NOTE — Progress Notes (Signed)
Dr. Clarnce FlockFudim called regarding patient going into sustained SVT (heart rate 150). Order received for 6mg  Adenosine IVP.   0220-- EKG obtained and Zoll pads on. 6mg  adenosine administered with brief return of NSR and then back into SVT.     150250-- Dr. Clarnce FlockFudim at bedside assessing patient. Order to decrease milrinone infusion to 0.125 mcg/kg/min.  [id number]-- Order for another dose of adenosine. 3mg  adenosine IVP administered with brief return of NSR and then back into SVT.   0302-- 2.5 mg IVP metoprolol ordered and administered.  0328--K 3.3- replaced with 40 mEq PO.  Patient is now back into her previous rhythm---NSR with PVCs/PACs.  Will continue to monitor patient/rhythm.

## 2017-04-12 NOTE — Progress Notes (Signed)
Inpatient Diabetes Program Recommendations  AACE/ADA: New Consensus Statement on Inpatient Glycemic Control (2015)  Target Ranges:  Prepandial:   less than 140 mg/dL      Peak postprandial:   less than 180 mg/dL (1-2 hours)      Critically ill patients:  140 - 180 mg/dL   Results for Sue Martin, Sue Martin (MRN [id number]) as of 04/12/2017 13:03  Ref. Range 04/11/2017 08:07 04/11/2017 11:35 04/11/2017 15:57 04/11/2017 19:45 04/11/2017 23:46 04/12/2017 03:53 04/12/2017 08:20 04/12/2017 12:03  Glucose-Capillary Latest Ref Range: 65 - 99 mg/dL 782165 (H) 956202 (H) 213268 (H) 249 (H) 251 (H) 161 (H) 152 (H) 161 (H)   Review of Glycemic Control  Diabetes history: DM2 Outpatient Diabetes medications: Tresiba 30 units daily, Novolog 35 units TID with meals Current orders for Inpatient glycemic control: Lantus 10 units daily, Novolog 0-20 units Q4H  Inpatient Diabetes Program Recommendations: Insulin - Basal: Noted Lantus 10 units daily ordered this morning. Please consider increasing to Lantus 15 units daily. Correction (SSI): Since patient is eating, please consider changing frequency of CBGs and Novolog correction to 0-20 units TID with meals and Novolog 0-5 units QHS. Insulin - Meal Coverage: Please consider ordering Novolog 4 units TID with meals for meal coverage if patient is eating at least 50% of meals.  Thanks, Orlando PennerMarie Swayze Kozuch, RN, MSN, CDE Diabetes Coordinator Inpatient Diabetes Program [phone] (Team Pager from 8am to 5pm)

## 2017-04-13 DIAGNOSIS — K7290 Hepatic failure, unspecified without coma: Secondary | ICD-10-CM

## 2017-04-13 LAB — GLUCOSE, CAPILLARY
GLUCOSE-CAPILLARY: 119 mg/dL — AB (ref 65–99)
GLUCOSE-CAPILLARY: 124 mg/dL — AB (ref 65–99)
GLUCOSE-CAPILLARY: 149 mg/dL — AB (ref 65–99)
GLUCOSE-CAPILLARY: 175 mg/dL — AB (ref 65–99)
GLUCOSE-CAPILLARY: 201 mg/dL — AB (ref 65–99)
Glucose-Capillary: 221 mg/dL — ABNORMAL HIGH (ref 65–99)

## 2017-04-13 LAB — BASIC METABOLIC PANEL
ANION GAP: 13 (ref 5–15)
BUN: 53 mg/dL — ABNORMAL HIGH (ref 6–20)
CALCIUM: 8.8 mg/dL — AB (ref 8.9–10.3)
CO2: 35 mmol/L — ABNORMAL HIGH (ref 22–32)
Chloride: 91 mmol/L — ABNORMAL LOW (ref 101–111)
Creatinine, Ser: 2.13 mg/dL — ABNORMAL HIGH (ref 0.44–1.00)
GFR calc Af Amer: 30 mL/min — ABNORMAL LOW (ref >60)
GFR, EST NON AFRICAN AMERICAN: 26 mL/min — AB (ref >60)
GLUCOSE: 128 mg/dL — AB (ref 65–99)
Potassium: 3.3 mmol/L — ABNORMAL LOW (ref 3.5–5.1)
SODIUM: 139 mmol/L (ref 135–145)

## 2017-04-13 LAB — TROPONIN I: Troponin I: 0.1 ng/mL (ref <0.03)

## 2017-04-13 LAB — COOXEMETRY PANEL
CARBOXYHEMOGLOBIN: 2.2 % — AB (ref 0.5–1.5)
CARBOXYHEMOGLOBIN: 2 % — AB (ref 0.5–1.5)
METHEMOGLOBIN: 1 % (ref 0.0–1.5)
METHEMOGLOBIN: 1.2 % (ref 0.0–1.5)
O2 Saturation: 84.9 %
O2 Saturation: 74.4 %
Total hemoglobin: 10.8 g/dL — ABNORMAL LOW (ref 12.0–16.0)
Total hemoglobin: 11 g/dL — ABNORMAL LOW (ref 12.0–16.0)

## 2017-04-13 LAB — URINE CULTURE: Culture: NO GROWTH

## 2017-04-13 LAB — MAGNESIUM: MAGNESIUM: 2.1 mg/dL (ref 1.7–2.4)

## 2017-04-13 MED ORDER — METOLAZONE 2.5 MG PO TABS
2.5000 mg | ORAL_TABLET | Freq: Once | ORAL | Status: AC
  Administered 2017-04-13: 2.5 mg via ORAL
  Filled 2017-04-13: qty 1

## 2017-04-13 MED ORDER — AMIODARONE HCL IN DEXTROSE 360-4.14 MG/200ML-% IV SOLN
INTRAVENOUS | Status: AC
  Filled 2017-04-13: qty 200

## 2017-04-13 MED ORDER — POTASSIUM CHLORIDE CRYS ER 20 MEQ PO TBCR
60.0000 meq | EXTENDED_RELEASE_TABLET | Freq: Once | ORAL | Status: AC
  Administered 2017-04-13: 60 meq via ORAL
  Filled 2017-04-13: qty 3

## 2017-04-13 MED ORDER — AMIODARONE HCL IN DEXTROSE 360-4.14 MG/200ML-% IV SOLN
60.0000 mg/h | INTRAVENOUS | Status: AC
  Administered 2017-04-13 (×2): 60 mg/h via INTRAVENOUS
  Filled 2017-04-13: qty 200

## 2017-04-13 MED ORDER — LACTULOSE 10 GM/15ML PO SOLN
20.0000 g | Freq: Three times a day (TID) | ORAL | Status: DC
  Administered 2017-04-13 (×3): 20 g via ORAL
  Filled 2017-04-13 (×3): qty 30

## 2017-04-13 MED ORDER — MAGNESIUM SULFATE 2 GM/50ML IV SOLN
INTRAVENOUS | Status: AC
  Filled 2017-04-13: qty 50

## 2017-04-13 MED ORDER — MAGNESIUM SULFATE 2 GM/50ML IV SOLN
2.0000 g | Freq: Once | INTRAVENOUS | Status: AC
  Administered 2017-04-13: 2 g via INTRAVENOUS

## 2017-04-13 MED ORDER — AMIODARONE HCL IN DEXTROSE 360-4.14 MG/200ML-% IV SOLN
30.0000 mg/h | INTRAVENOUS | Status: DC
  Administered 2017-04-13 – 2017-04-18 (×11): 30 mg/h via INTRAVENOUS
  Filled 2017-04-13 (×11): qty 200

## 2017-04-13 MED ORDER — POTASSIUM CHLORIDE 10 MEQ/50ML IV SOLN
10.0000 meq | INTRAVENOUS | Status: AC
  Administered 2017-04-13 (×2): 10 meq via INTRAVENOUS
  Filled 2017-04-13: qty 50

## 2017-04-13 MED ORDER — AMIODARONE LOAD VIA INFUSION
150.0000 mg | Freq: Once | INTRAVENOUS | Status: AC
  Administered 2017-04-13: 150 mg via INTRAVENOUS
  Filled 2017-04-13: qty 83.34

## 2017-04-13 NOTE — Progress Notes (Signed)
Pt went into SVT, MD called and immediatly came to bedside.  Amio bolus and gtt initiated. Extra mag and K given.  Co-ox and troponin sent down. Pt fully A&O throughout, but c/o mid back pain and chest tightness, which has since resolved.  MD ordered to hold milrinone gtt at this time.  Will continue to monitor closely and update as needed. Emotional support given to pt.

## 2017-04-13 NOTE — Progress Notes (Signed)
Advanced Heart Failure Rounding Note  PCP:  Primary Cardiologist: Dr Wynonia HazardKhawaja  Subjective:    Admitted with acute on chronic combined CHF with worsening SOB and edema. HF team asked to consult with initial mixed venous saturation of 52% on PICC line.   Yesterday diuresed with IV lasix, diamox, and metolazone. CVP down to 16. Earlier this morning she developed A fib RVR.  Dyspneic with exertion.   Objective:   Weight Range: 251 lb 15.8 oz (114.3 kg) Body mass index is 58.57 kg/m.   Vital Signs:   Temp:  [97.6 F (36.4 C)-98 F (36.7 C)] 98 F (36.7 C) (10/18 0800) Pulse Rate:  [63-152] 76 (10/18 0800) Resp:  [14-28] 17 (10/18 0800) BP: (83-116)/(40-84) 93/59 (10/18 0800) SpO2:  [89 %-99 %] 96 % (10/18 0800) Weight:  [251 lb 15.8 oz (114.3 kg)] 251 lb 15.8 oz (114.3 kg) (10/18 0440) Last BM Date: 04/10/17  Weight change: Filed Weights   04/11/17 0500 04/12/17 0500 04/13/17 0440  Weight: 252 lb 10.4 oz (114.6 kg) 255 lb 8.2 oz (115.9 kg) 251 lb 15.8 oz (114.3 kg)    Intake/Output:   Intake/Output Summary (Last 24 hours) at 04/13/17 0907 Last data filed at 04/13/17 0842  Gross per 24 hour  Intake           890.51 ml  Output             1600 ml  Net          -709.49 ml      Physical Exam  CVP 16.  General:  Appears chronically ill. No resp difficulty HEENT: normal Neck: supple.JVD to jaw . Carotids 2+ bilat; no bruits. No lymphadenopathy or thryomegaly appreciated. Cor: PMI nondisplaced. Irregular rate & rhythm. No rubs, gallops or murmurs. Lungs: Decreased on 5 liters oxygen.  Abdomen: soft, nontender, distended. No hepatosplenomegaly. No bruits or masses. Good bowel sounds. Extremities: no cyanosis, clubbing, rash, R and LLE BKE  2+edema Neuro: alert & orientedx3, cranial nerves grossly intact. moves all 4 extremities w/o difficulty. Affect pleasant    Telemetry  A fib RVR 140s with PVCs. Around 0600 am. Back in NSR 70-80s with amio load. Personally  reviewed.     EKG   NSR 68 this am (04/10/17) QTc 504.   Labs    CBC  Recent Labs  04/11/17 0457 04/12/17 1033  WBC 6.0 6.5  NEUTROABS 4.8  --   HGB 11.0* 10.8*  HCT 35.5* 35.2*  MCV 101.4* 102.6*  PLT 83* 87*   Basic Metabolic Panel  Recent Labs  04/12/17 0150 04/13/17 0421  NA 137 139  K 3.3* 3.3*  CL 92* 91*  CO2 34* 35*  GLUCOSE 244* 128*  BUN 54* 53*  CREATININE 2.22* 2.13*  CALCIUM 8.6* 8.8*  MG 2.1 2.1   Liver Function Tests No results for input(s): AST, ALT, ALKPHOS, BILITOT, PROT, ALBUMIN in the last 72 hours. No results for input(s): LIPASE, AMYLASE in the last 72 hours. Cardiac Enzymes  Recent Labs  04/13/17 0730  TROPONINI 0.10*    BNP: BNP (last 3 results)  Recent Labs  01/22/17 1723 04/06/17 1810 04/09/17 1129  BNP 474.8* 455.6* 459.2*    ProBNP (last 3 results) No results for input(s): PROBNP in the last 8760 hours.   D-Dimer No results for input(s): DDIMER in the last 72 hours. Hemoglobin A1C No results for input(s): HGBA1C in the last 72 hours. Fasting Lipid Panel No results for input(s): CHOL, HDL, LDLCALC,  TRIG, CHOLHDL, LDLDIRECT in the last 72 hours. Thyroid Function Tests No results for input(s): TSH, T4TOTAL, T3FREE, THYROIDAB in the last 72 hours.  Invalid input(s): FREET3  Other results:   Imaging    No results found.   Medications:     Scheduled Medications: . acetaZOLAMIDE  500 mg Oral BID  . aspirin  81 mg Oral Daily  . atorvastatin  20 mg Oral QPM  . Chlorhexidine Gluconate Cloth  6 each Topical Daily  . insulin aspart  0-20 Units Subcutaneous Q4H  . insulin glargine  10 Units Subcutaneous Daily  . lactulose  20 g Oral TID  . loperamide  2 mg Oral QAC breakfast  . mouth rinse  15 mL Mouth Rinse BID  . metoCLOPramide  5 mg Oral TID AC  . mupirocin ointment  1 application Nasal BID  . potassium chloride  60 mEq Oral BID  . sodium chloride flush  3 mL Intravenous Q12H    Infusions: .  sodium chloride    . amiodarone 60 mg/hr (04/13/17 0739)   Followed by  . amiodarone    . furosemide 120 mg (04/13/17 0800)  . milrinone Stopped (04/13/17 0842)  . potassium chloride Stopped (04/13/17 0842)    PRN Medications: sodium chloride, acetaminophen, cyclobenzaprine, ondansetron (ZOFRAN) IV, sodium chloride flush, sodium chloride flush    Patient Profile   Sue Martin is a 51 y.o. 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  Admitted with acute on chronic combined CHF with worsening SOB and edema. HF team asked to consult with initial mixed venous saturation of 52% on PICC line.   Assessment/Plan   1. A/C Combined Systolic/Diastolic Heart Failure - ECHO 06/6107 EF 35%. Grade III DD. -Repeat ECHO today.  -Stopping milrinone with A fib and hypotension.  - CVP 16.  I/O not accurate due to urinary incontinence.  -Continue lasix 120 mg three times a day  Improved urine output.Continue current diuretic regimen.  - Lasix 120 mg three times a day. Diamox 500 mg twice a day.  Continue 2.5 mg metolazone.  -No BB with low output.  - No arb/spiro/dig with AKI 2. 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   3. A/C Hypoxic Respiratory Failure  O2 sats 90% on 5 liters oxygen daily.   4. CAD- PCI LAD and RCA.  No S/S  Ischemia.  Not on bb with low output hf. Continue asa and statin.  5. DMII- on sliding scale.  6. Cirrhosis- GI following. No plan for paracentesis. Ammonia 80. Lactulose 20 grams three times a day.  7. 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 Hematuria- Hgb stable. ?  Foley trauma.  10. Afib RVR- Load amio. Keep K >4 and Mag > 2. Discuss anticoagulation with Dr Gala Romney. Platelets 87. Consider adding heparin.  Given 2 grams mag and supplement K.    Called by nursing staff for tachycardia. A Fib/A Flutter RVR. Given 150 mg amio bolus, potassium  supplemented, and 2 grams mag. Converted to NSR.   Length of Stay: 7  Sue Clegg, NP  04/13/2017, 9:07 AM  Advanced Heart Failure Team Pager [phone] (M-F; 7a - 4p)  Please contact [facility] for night-coverage after hours (4p -7a ) and weekends on [email or web address]  Patient seen and examined with Sue Becket, NP. We discussed all aspects of the encounter. I agree with the assessment  and plan as stated above.   Yesterday was increasingly lethargic. ABG with elevated pCO2 but compensated pH. Ammonia 80. Have started lactulose. This am with recurrent SVT/AFL. Milrinone stopped. Amio started. Now back in NSR. CVP finally starting to come down but remains 16. Co-ox ok. Weight minimally changed. Will continue aggressive IV diuresis. Increase lasix to 160.   Arvilla Meres, MD  11:38 AM

## 2017-04-14 LAB — BASIC METABOLIC PANEL
Anion gap: 14 (ref 5–15)
BUN: 53 mg/dL — ABNORMAL HIGH (ref 6–20)
CALCIUM: 8.9 mg/dL (ref 8.9–10.3)
CO2: 33 mmol/L — AB (ref 22–32)
CREATININE: 2.26 mg/dL — AB (ref 0.44–1.00)
Chloride: 90 mmol/L — ABNORMAL LOW (ref 101–111)
GFR, EST AFRICAN AMERICAN: 28 mL/min — AB (ref >60)
GFR, EST NON AFRICAN AMERICAN: 24 mL/min — AB (ref >60)
GLUCOSE: 165 mg/dL — AB (ref 65–99)
Potassium: 3.9 mmol/L (ref 3.5–5.1)
Sodium: 137 mmol/L (ref 135–145)

## 2017-04-14 LAB — CBC
HCT: 39 % (ref 36.0–46.0)
Hemoglobin: 11.7 g/dL — ABNORMAL LOW (ref 12.0–15.0)
MCH: 31.3 pg (ref 26.0–34.0)
MCHC: 30 g/dL (ref 30.0–36.0)
MCV: 104.3 fL — ABNORMAL HIGH (ref 78.0–100.0)
PLATELETS: 87 10*3/uL — AB (ref 150–400)
RBC: 3.74 MIL/uL — ABNORMAL LOW (ref 3.87–5.11)
RDW: 16.2 % — AB (ref 11.5–15.5)
WBC: 6.6 10*3/uL (ref 4.0–10.5)

## 2017-04-14 LAB — GLUCOSE, CAPILLARY
GLUCOSE-CAPILLARY: 193 mg/dL — AB (ref 65–99)
GLUCOSE-CAPILLARY: 136 mg/dL — AB (ref 65–99)
Glucose-Capillary: 130 mg/dL — ABNORMAL HIGH (ref 65–99)
Glucose-Capillary: 132 mg/dL — ABNORMAL HIGH (ref 65–99)
Glucose-Capillary: 142 mg/dL — ABNORMAL HIGH (ref 65–99)
Glucose-Capillary: 172 mg/dL — ABNORMAL HIGH (ref 65–99)
Glucose-Capillary: 194 mg/dL — ABNORMAL HIGH (ref 65–99)

## 2017-04-14 LAB — COOXEMETRY PANEL
CARBOXYHEMOGLOBIN: 1.9 % — AB (ref 0.5–1.5)
METHEMOGLOBIN: 1.2 % (ref 0.0–1.5)
O2 SAT: 66.7 %
Total hemoglobin: 12 g/dL (ref 12.0–16.0)

## 2017-04-14 LAB — AMMONIA: AMMONIA: 31 umol/L (ref 9–35)

## 2017-04-14 LAB — MAGNESIUM: MAGNESIUM: 2.6 mg/dL — AB (ref 1.7–2.4)

## 2017-04-14 MED ORDER — GERHARDT'S BUTT CREAM
TOPICAL_CREAM | CUTANEOUS | Status: DC | PRN
  Filled 2017-04-14: qty 1

## 2017-04-14 MED ORDER — DEXTROSE 5 % IV SOLN
160.0000 mg | Freq: Three times a day (TID) | INTRAVENOUS | Status: DC
  Administered 2017-04-14 – 2017-04-19 (×15): 160 mg via INTRAVENOUS
  Filled 2017-04-14: qty 4
  Filled 2017-04-14: qty 16
  Filled 2017-04-14: qty 4
  Filled 2017-04-14 (×2): qty 10
  Filled 2017-04-14: qty 16
  Filled 2017-04-14: qty 10
  Filled 2017-04-14: qty 16
  Filled 2017-04-14 (×2): qty 10
  Filled 2017-04-14: qty 16
  Filled 2017-04-14: qty 2
  Filled 2017-04-14 (×4): qty 16
  Filled 2017-04-14: qty 10

## 2017-04-14 MED ORDER — METOLAZONE 5 MG PO TABS
5.0000 mg | ORAL_TABLET | Freq: Every day | ORAL | Status: DC
  Administered 2017-04-14 – 2017-04-19 (×6): 5 mg via ORAL
  Filled 2017-04-14 (×6): qty 1

## 2017-04-14 MED ORDER — LACTULOSE 10 GM/15ML PO SOLN
20.0000 g | Freq: Two times a day (BID) | ORAL | Status: DC
  Administered 2017-04-14 – 2017-04-15 (×3): 20 g via ORAL
  Filled 2017-04-14 (×3): qty 30

## 2017-04-14 MED ORDER — INSULIN GLARGINE 100 UNIT/ML ~~LOC~~ SOLN
15.0000 [IU] | Freq: Every day | SUBCUTANEOUS | Status: DC
  Administered 2017-04-14 – 2017-04-18 (×5): 15 [IU] via SUBCUTANEOUS
  Filled 2017-04-14 (×6): qty 0.15

## 2017-04-14 NOTE — Plan of Care (Signed)
Problem: Activity: Goal: Risk for activity intolerance will decrease Outcome: Not Progressing Pt gets anxious and O2 drops during activity in bed; poor appetite  Problem: Fluid Volume: Goal: Ability to maintain a balanced intake and output will improve Outcome: Not Progressing Pt ate leass than 10% of morning meal  Problem: Bowel/Gastric: Goal: Will not experience complications related to bowel motility Outcome: Progressing Pt having multiple stools throughout the day.  Problem: Activity: Goal: Capacity to carry out activities will improve Outcome: Not Progressing Pt gets anxious and O2 drops during activity in bed; poor appetite  Problem: Education: Goal: Ability to demonstrate managment of disease process will improve Outcome: Not Progressing Pt not at all interested in participating in ADLs

## 2017-04-14 NOTE — Progress Notes (Signed)
Advanced Heart Failure Rounding Note  PCP:  Primary Cardiologist: Dr Wynonia Hazard  Subjective:    Admitted with acute on chronic combined CHF with worsening SOB and edema. HF team asked to consult with initial mixed venous saturation of 52% on PICC line.   10/18 A Flutter/ SVT- started on amio. Diuresing with 120 mg  IV lasix three times a day.  Started on lactulose. Ammonia down  31.   Denies  SOB.   Objective:   Weight Range: 251 lb 8.7 oz (114.1 kg) Body mass index is 58.46 kg/m.   Vital Signs:   Temp:  [97.5 F (36.4 C)-98.3 F (36.8 C)] 97.8 F (36.6 C) (10/19 0742) Pulse Rate:  [64-81] 65 (10/19 0700) Resp:  [14-24] 23 (10/19 0700) BP: (90-123)/(51-90) 99/75 (10/19 0700) SpO2:  [90 %-100 %] 90 % (10/19 0700) Weight:  [251 lb 8.7 oz (114.1 kg)] 251 lb 8.7 oz (114.1 kg) (10/19 0400) Last BM Date: 04/10/17  Weight change: Filed Weights   04/12/17 0500 04/13/17 0440 04/14/17 0400  Weight: 255 lb 8.2 oz (115.9 kg) 251 lb 15.8 oz (114.3 kg) 251 lb 8.7 oz (114.1 kg)    Intake/Output:   Intake/Output Summary (Last 24 hours) at 04/14/17 0747 Last data filed at 04/14/17 0700  Gross per 24 hour  Intake          1380.87 ml  Output              425 ml  Net           955.87 ml      Physical Exam  CVP 26-27 personally reviewed General:  Chronically ill  appearing. No resp difficulty HEENT: normal Neck: supple. JVP to jaw . Carotids 2+ bilat; no bruits. No lymphadenopathy or thryomegaly appreciated. Cor: PMI nondisplaced. Regular rate & rhythm. 2/6 TR Lungs: Decreased in the bases on 53 liters.  Abdomen: soft, nontender, markedly tight and distended. No hepatosplenomegaly. No bruits or masses. Good bowel sounds. Extremities: no cyanosis, clubbing, rash, R and L BKA edema RUE PICC Neuro: alert & orientedx3, cranial nerves grossly intact. moves all 4 extremities w/o difficulty. Affect pleasant   Telemetry  NSR 60-70s Personally reviewed   EKG   NSR 68 this am  (04/10/17) QTc 504.   Labs    CBC  Recent Labs  04/12/17 1033 04/14/17 0438  WBC 6.5 6.6  HGB 10.8* 11.7*  HCT 35.2* 39.0  MCV 102.6* 104.3*  PLT 87* 87*   Basic Metabolic Panel  Recent Labs  04/13/17 0421 04/14/17 0438  NA 139 137  K 3.3* 3.9  CL 91* 90*  CO2 35* 33*  GLUCOSE 128* 165*  BUN 53* 53*  CREATININE 2.13* 2.26*  CALCIUM 8.8* 8.9  MG 2.1 2.6*   Liver Function Tests No results for input(s): AST, ALT, ALKPHOS, BILITOT, PROT, ALBUMIN in the last 72 hours. No results for input(s): LIPASE, AMYLASE in the last 72 hours. Cardiac Enzymes  Recent Labs  04/13/17 0730  TROPONINI 0.10*    BNP: BNP (last 3 results)  Recent Labs  01/22/17 1723 04/06/17 1810 04/09/17 1129  BNP 474.8* 455.6* 459.2*    ProBNP (last 3 results) No results for input(s): PROBNP in the last 8760 hours.   D-Dimer No results for input(s): DDIMER in the last 72 hours. Hemoglobin A1C No results for input(s): HGBA1C in the last 72 hours. Fasting Lipid Panel No results for input(s): CHOL, HDL, LDLCALC, TRIG, CHOLHDL, LDLDIRECT in the last 72 hours. Thyroid  Function Tests No results for input(s): TSH, T4TOTAL, T3FREE, THYROIDAB in the last 72 hours.  Invalid input(s): FREET3  Other results:   Imaging    No results found.   Medications:     Scheduled Medications: . acetaZOLAMIDE  500 mg Oral BID  . aspirin  81 mg Oral Daily  . atorvastatin  20 mg Oral QPM  . Chlorhexidine Gluconate Cloth  6 each Topical Daily  . insulin aspart  0-20 Units Subcutaneous Q4H  . insulin glargine  10 Units Subcutaneous Daily  . lactulose  20 g Oral TID  . loperamide  2 mg Oral QAC breakfast  . mouth rinse  15 mL Mouth Rinse BID  . metoCLOPramide  5 mg Oral TID AC  . mupirocin ointment  1 application Nasal BID  . potassium chloride  60 mEq Oral BID  . sodium chloride flush  3 mL Intravenous Q12H    Infusions: . sodium chloride    . amiodarone 30 mg/hr (04/14/17 0700)  .  furosemide Stopped (04/14/17 0015)    PRN Medications: sodium chloride, acetaminophen, cyclobenzaprine, ondansetron (ZOFRAN) IV, sodium chloride flush, sodium chloride flush    Patient Profile   Selita Malta is a 51 y.o. 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  Admitted with acute on chronic combined CHF with worsening SOB and edema. HF team asked to consult with initial mixed venous saturation of 52% on PICC line.   Assessment/Plan   1. A/C Combined Systolic/Diastolic Heart Failure - ECHO 06/6107 EF 35%. Echo 04/11/17 EF 35% Grade III DD.  -Off milrinone with hypotension.  - CO-OX 67%. I/O not accurate with urinary and fecal incontinence.  -Increased lasix to 160 mg three times a day.  -Continue diamox 500 mg twice a day.   - Give 5 mg metolazone today.  -No BB with low output.  - No arb/spiro/dig with AKI - Renal function ok.  2. 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   3. A/C Hypoxic Respiratory Failure  O2 sats 90% on 5 liters oxygen daily.   - ABG with chronic CO2 retention with pCO2 in 60s 4. CAD- PCI LAD and RCA.  No S/S  Ischemia.  Not on bb with low output hf. Continue asa and statin.  5. DMII- on sliding scale.  6. Cirrhosis- GI following. No plan for paracentesis.  Ammonia trending down from 80>31. Cut back lactulose to 20 grams twice a day.  7. 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 Hematuria- Hgb stable. ?  Foley trauma. UA ok.  10. SVT - Load amio. Maintaining  NSR.  Keep K >4 and Mag > 2.Given 2 grams mag and supplement K.   11. Hepatic encephalopathy - Improved with lactulose. NH3 now 31.  - Decrease lactulose to 20 bid  Length of Stay: 8  Amy Clegg, NP  04/14/2017, 7:47 AM  Advanced Heart Failure Team Pager [phone] (M-F; 7a - 4p)  Please contact [facility] for night-coverage after hours (4p -7a ) and  weekends on [email or web address]  Patient seen and examined with Tonye Becket, NP. We discussed all aspects of the encounter. I agree with the assessment and plan as stated above.   Remains very tenuous. Hepatic encephalopathy improved with lactulose. Will decrease to 20 bid. Volume status markedly elevated; now 27.  Weight really not changing despite high-dose lasix. Will increase  lasix. Add metolazone. No further SVT.  Will continue attempts at aggressive diuresis over the weekend.   Once out of ICU will ask [facility] to resume care.   Arvilla MeresBensimhon, Niam Nepomuceno, MD  8:25 AM

## 2017-04-15 ENCOUNTER — Inpatient Hospital Stay (HOSPITAL_COMMUNITY): Payer: Medicare HMO

## 2017-04-15 LAB — GLUCOSE, CAPILLARY
GLUCOSE-CAPILLARY: 98 mg/dL (ref 65–99)
GLUCOSE-CAPILLARY: 98 mg/dL (ref 65–99)
GLUCOSE-CAPILLARY: 112 mg/dL — AB (ref 65–99)
Glucose-Capillary: 133 mg/dL — ABNORMAL HIGH (ref 65–99)
Glucose-Capillary: 139 mg/dL — ABNORMAL HIGH (ref 65–99)

## 2017-04-15 LAB — AMMONIA: Ammonia: 104 umol/L — ABNORMAL HIGH (ref 9–35)

## 2017-04-15 LAB — CBC
HCT: 38.8 % (ref 36.0–46.0)
HEMOGLOBIN: 11.8 g/dL — AB (ref 12.0–15.0)
MCH: 31.7 pg (ref 26.0–34.0)
MCHC: 30.4 g/dL (ref 30.0–36.0)
MCV: 104.3 fL — AB (ref 78.0–100.0)
Platelets: 84 10*3/uL — ABNORMAL LOW (ref 150–400)
RBC: 3.72 MIL/uL — AB (ref 3.87–5.11)
RDW: 16.7 % — ABNORMAL HIGH (ref 11.5–15.5)
WBC: 5.9 10*3/uL (ref 4.0–10.5)

## 2017-04-15 LAB — BASIC METABOLIC PANEL
ANION GAP: 13 (ref 5–15)
BUN: 54 mg/dL — ABNORMAL HIGH (ref 6–20)
CHLORIDE: 90 mmol/L — AB (ref 101–111)
CO2: 34 mmol/L — AB (ref 22–32)
Calcium: 9 mg/dL (ref 8.9–10.3)
Creatinine, Ser: 2.32 mg/dL — ABNORMAL HIGH (ref 0.44–1.00)
GFR calc non Af Amer: 23 mL/min — ABNORMAL LOW (ref >60)
GFR, EST AFRICAN AMERICAN: 27 mL/min — AB (ref >60)
GLUCOSE: 102 mg/dL — AB (ref 65–99)
Potassium: 3.4 mmol/L — ABNORMAL LOW (ref 3.5–5.1)
Sodium: 137 mmol/L (ref 135–145)

## 2017-04-15 LAB — COOXEMETRY PANEL
Carboxyhemoglobin: 1.5 % (ref 0.5–1.5)
Methemoglobin: 1.2 % (ref 0.0–1.5)
O2 SAT: 56.7 %
Total hemoglobin: 12.9 g/dL (ref 12.0–16.0)

## 2017-04-15 LAB — MAGNESIUM: Magnesium: 2.6 mg/dL — ABNORMAL HIGH (ref 1.7–2.4)

## 2017-04-15 MED ORDER — LACTULOSE 10 GM/15ML PO SOLN
60.0000 g | Freq: Three times a day (TID) | ORAL | Status: DC
  Administered 2017-04-15 – 2017-04-17 (×7): 60 g via ORAL
  Filled 2017-04-15 (×10): qty 90

## 2017-04-15 MED ORDER — RIFAXIMIN 550 MG PO TABS
550.0000 mg | ORAL_TABLET | Freq: Two times a day (BID) | ORAL | Status: DC
  Administered 2017-04-15 – 2017-04-19 (×8): 550 mg via ORAL
  Filled 2017-04-15 (×9): qty 1

## 2017-04-15 MED ORDER — RIFAXIMIN 200 MG PO TABS: 200.0000 mg | ORAL_TABLET | Freq: Two times a day (BID) | ORAL | Status: DC

## 2017-04-15 NOTE — Progress Notes (Signed)
Advanced Heart Failure Rounding Note  PCP:  Primary Cardiologist: Dr Wynonia Hazard  Subjective:    Admitted with acute on chronic combined CHF with worsening SOB and edema. HF team asked to consult with initial mixed venous saturation of 52% on PICC line.   10/18 A Flutter/ SVT- started on amio.   Lasix increase to 160q 8 yesterday. Weight down 3 pounds but still CVP 29. Very lethargic    Co-ox 57% off milrinone. Creatinine climbing slightly 2.1->2.2->2.3. On lactulose for hepatic encephalopathy   Objective:   Weight Range: 112.5 kg (248 lb 0.3 oz) Body mass index is 57.64 kg/m.   Vital Signs:   Temp:  [97.4 F (36.3 C)-97.6 F (36.4 C)] 97.4 F (36.3 C) (10/20 0740) Pulse Rate:  [45-66] 64 (10/20 0700) Resp:  [11-24] 19 (10/20 0700) BP: (82-113)/(42-82) 108/62 (10/20 0700) SpO2:  [87 %-100 %] 100 % (10/20 0700) Weight:  [112.5 kg (248 lb 0.3 oz)] 112.5 kg (248 lb 0.3 oz) (10/20 0417) Last BM Date: 04/14/17  Weight change: Filed Weights   04/13/17 0440 04/14/17 0400 04/15/17 0417  Weight: 114.3 kg (251 lb 15.8 oz) 114.1 kg (251 lb 8.7 oz) 112.5 kg (248 lb 0.3 oz)    Intake/Output:   Intake/Output Summary (Last 24 hours) at 04/15/17 1014 Last data filed at 04/15/17 0600  Gross per 24 hour  Intake              450 ml  Output                6 ml  Net              444 ml      Physical Exam  CVP 29-30 personally reviewed General:  Very lethargic. Arouses but then falls back to sleep  No resp difficulty HEENT: normal Neck: supple thick  Unable to see JVD. Carotids 2+ bilat; no bruits. No lymphadenopathy or thryomegaly appreciated. Cor: PMI nondisplaced. Regular rate & rhythm. 2/6 TR Lungs: clear anteriorly  Abdomen: soft, nontender, +distended very tight. No hepatosplenomegaly. No bruits or masses. Good bowel sounds. Extremities: s/p bilateral BKAs  Neuro: arousable and will converse but then falls back to sleep  cranial nerves grossly intact. +  asterixis  Telemetry  NSR 60-70s Personally reviewed   EKG   NSR 68 this am (04/10/17) QTc 504.   Labs    CBC  Recent Labs  04/14/17 0438 04/15/17 0403  WBC 6.6 5.9  HGB 11.7* 11.8*  HCT 39.0 38.8  MCV 104.3* 104.3*  PLT 87* 84*   Basic Metabolic Panel  Recent Labs  04/14/17 0438 04/15/17 0403  NA 137 137  K 3.9 3.4*  CL 90* 90*  CO2 33* 34*  GLUCOSE 165* 102*  BUN 53* 54*  CREATININE 2.26* 2.32*  CALCIUM 8.9 9.0  MG 2.6* 2.6*   Liver Function Tests No results for input(s): AST, ALT, ALKPHOS, BILITOT, PROT, ALBUMIN in the last 72 hours. No results for input(s): LIPASE, AMYLASE in the last 72 hours. Cardiac Enzymes  Recent Labs  04/13/17 0730  TROPONINI 0.10*    BNP: BNP (last 3 results)  Recent Labs  01/22/17 1723 04/06/17 1810 04/09/17 1129  BNP 474.8* 455.6* 459.2*    ProBNP (last 3 results) No results for input(s): PROBNP in the last 8760 hours.   D-Dimer No results for input(s): DDIMER in the last 72 hours. Hemoglobin A1C No results for input(s): HGBA1C in the last 72 hours. Fasting Lipid Panel No  results for input(s): CHOL, HDL, LDLCALC, TRIG, CHOLHDL, LDLDIRECT in the last 72 hours. Thyroid Function Tests No results for input(s): TSH, T4TOTAL, T3FREE, THYROIDAB in the last 72 hours.  Invalid input(s): FREET3  Other results:   Imaging    No results found.   Medications:     Scheduled Medications: . acetaZOLAMIDE  500 mg Oral BID  . aspirin  81 mg Oral Daily  . atorvastatin  20 mg Oral QPM  . Chlorhexidine Gluconate Cloth  6 each Topical Daily  . insulin aspart  0-20 Units Subcutaneous Q4H  . insulin glargine  15 Units Subcutaneous Daily  . lactulose  20 g Oral BID  . loperamide  2 mg Oral QAC breakfast  . mouth rinse  15 mL Mouth Rinse BID  . metoCLOPramide  5 mg Oral TID AC  . metolazone  5 mg Oral Daily  . mupirocin ointment  1 application Nasal BID  . potassium chloride  60 mEq Oral BID  . sodium chloride  flush  3 mL Intravenous Q12H    Infusions: . sodium chloride    . amiodarone 30 mg/hr (04/15/17 0929)  . furosemide Stopped (04/15/17 0924)    PRN Medications: sodium chloride, acetaminophen, cyclobenzaprine, Gerhardt's butt cream, ondansetron (ZOFRAN) IV, sodium chloride flush, sodium chloride flush    Patient Profile   Sue Martin is a 51 y.o. 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  Admitted with acute on chronic combined CHF with worsening SOB and edema. HF team asked to consult with initial mixed venous saturation of 52% on PICC line.   Assessment/Plan   1. A/C Combined Systolic/Diastolic Heart Failure - ECHO 1/61097/2018 EF 35%. Echo 04/11/17 EF 35% Grade III DD.  -Off milrinone with SVT -she has severe biventricular failure with R>>L HF. Despite high-dose diuretics we have been unable to diurese her and CVP ~ 30. Will continue - BP too low for other agents. - Suspect we may be nearing Palliative siutation  2. 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 A/C Hypoxic Respiratory Failure  O2 sats 90% on 5 liters oxygen daily.   - ABG with chronic CO2 retention with pCO2 in 60s 4. CAD- PCI LAD and RCA.  --No S/S  Ischemia.  Not on bb with low output hf. Continue asa and statin.  5. DMII- on sliding scale.  6. Cirrhosis with hepatic encephalopathy - Initial ammonia 81. Lactulose started but cut back yesterday - Will repeat ammonia stat. Increase lactulose (may need NGT). Add rifaxin 7. 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 SVT - Load amio. Maintaining  NSR.  Keep K >4 and Mag > 2.Given 2 grams mag and supplement K.    Overall poor response to therapy. With [facility] will consult Palliative Care  Length of Stay: 9  Sue Martin, Sue Lantier, MD  04/15/2017, 10:14 AM  Advanced Heart Failure Team Pager [phone] (M-F; 7a - 4p)  Please  contact [facility] for night-coverage after hours (4p -7a ) and weekends on [email or web address]

## 2017-04-16 DIAGNOSIS — K7682 Hepatic encephalopathy: Secondary | ICD-10-CM

## 2017-04-16 DIAGNOSIS — Z7189 Other specified counseling: Secondary | ICD-10-CM

## 2017-04-16 DIAGNOSIS — L8990 Pressure ulcer of unspecified site, unspecified stage: Secondary | ICD-10-CM | POA: Insufficient documentation

## 2017-04-16 DIAGNOSIS — Z515 Encounter for palliative care: Secondary | ICD-10-CM

## 2017-04-16 DIAGNOSIS — K7290 Hepatic failure, unspecified without coma: Secondary | ICD-10-CM

## 2017-04-16 LAB — BASIC METABOLIC PANEL
ANION GAP: 13 (ref 5–15)
BUN: 55 mg/dL — AB (ref 6–20)
CO2: 36 mmol/L — ABNORMAL HIGH (ref 22–32)
CREATININE: 2.31 mg/dL — AB (ref 0.44–1.00)
Calcium: 9.3 mg/dL (ref 8.9–10.3)
Chloride: 93 mmol/L — ABNORMAL LOW (ref 101–111)
GFR, EST AFRICAN AMERICAN: 27 mL/min — AB (ref >60)
GFR, EST NON AFRICAN AMERICAN: 23 mL/min — AB (ref >60)
GLUCOSE: 129 mg/dL — AB (ref 65–99)
Potassium: 2.5 mmol/L — CL (ref 3.5–5.1)
Sodium: 142 mmol/L (ref 135–145)

## 2017-04-16 LAB — CBC
HEMATOCRIT: 40 % (ref 36.0–46.0)
HEMOGLOBIN: 11.9 g/dL — AB (ref 12.0–15.0)
MCH: 31.2 pg (ref 26.0–34.0)
MCHC: 29.8 g/dL — ABNORMAL LOW (ref 30.0–36.0)
MCV: 104.7 fL — AB (ref 78.0–100.0)
Platelets: 74 10*3/uL — ABNORMAL LOW (ref 150–400)
RBC: 3.82 MIL/uL — AB (ref 3.87–5.11)
RDW: 16.7 % — ABNORMAL HIGH (ref 11.5–15.5)
WBC: 5.9 10*3/uL (ref 4.0–10.5)

## 2017-04-16 LAB — GLUCOSE, CAPILLARY
GLUCOSE-CAPILLARY: 110 mg/dL — AB (ref 65–99)
GLUCOSE-CAPILLARY: 126 mg/dL — AB (ref 65–99)
GLUCOSE-CAPILLARY: 155 mg/dL — AB (ref 65–99)
Glucose-Capillary: 110 mg/dL — ABNORMAL HIGH (ref 65–99)
Glucose-Capillary: 144 mg/dL — ABNORMAL HIGH (ref 65–99)
Glucose-Capillary: 178 mg/dL — ABNORMAL HIGH (ref 65–99)
Glucose-Capillary: 187 mg/dL — ABNORMAL HIGH (ref 65–99)

## 2017-04-16 LAB — COOXEMETRY PANEL
Carboxyhemoglobin: 1.5 % (ref 0.5–1.5)
Methemoglobin: 1 % (ref 0.0–1.5)
O2 Saturation: 75.3 %
Total hemoglobin: 12.4 g/dL (ref 12.0–16.0)

## 2017-04-16 LAB — POTASSIUM: POTASSIUM: 3.5 mmol/L (ref 3.5–5.1)

## 2017-04-16 LAB — MAGNESIUM: MAGNESIUM: 2.6 mg/dL — AB (ref 1.7–2.4)

## 2017-04-16 LAB — AMMONIA: Ammonia: 52 umol/L — ABNORMAL HIGH (ref 9–35)

## 2017-04-16 MED ORDER — MORPHINE SULFATE (CONCENTRATE) 10 MG/0.5ML PO SOLN
2.5000 mg | ORAL | Status: DC | PRN
  Administered 2017-04-18: 5 mg via ORAL
  Filled 2017-04-16: qty 0.5

## 2017-04-16 MED ORDER — POTASSIUM CHLORIDE 20 MEQ/15ML (10%) PO SOLN
20.0000 meq | Freq: Three times a day (TID) | ORAL | Status: DC
  Administered 2017-04-16 (×2): 20 meq via ORAL
  Filled 2017-04-16 (×2): qty 15

## 2017-04-16 MED ORDER — POTASSIUM CHLORIDE 10 MEQ/50ML IV SOLN
10.0000 meq | INTRAVENOUS | Status: AC
  Administered 2017-04-16 (×4): 10 meq via INTRAVENOUS
  Filled 2017-04-16 (×4): qty 50

## 2017-04-16 MED ORDER — POTASSIUM CHLORIDE 20 MEQ/15ML (10%) PO SOLN
40.0000 meq | Freq: Once | ORAL | Status: AC
  Administered 2017-04-16: 40 meq via ORAL
  Filled 2017-04-16: qty 30

## 2017-04-16 MED ORDER — POTASSIUM CHLORIDE 20 MEQ PO PACK
60.0000 meq | PACK | Freq: Two times a day (BID) | ORAL | Status: DC
  Administered 2017-04-16: 60 meq via ORAL
  Filled 2017-04-16: qty 3

## 2017-04-16 NOTE — Progress Notes (Signed)
Dr. Cristal Deerhristopher notified of critical potassium level of 2.5. Orders entered by MD for potassium replacement.

## 2017-04-16 NOTE — Progress Notes (Addendum)
No charge note.  Spoke with husband Nedra HaiLee on the phone.  He is supportive of DNR code status for Sue Martin.  He understands that is what she would want.  Nedra HaiLee will come to the hospital tomorrow evening when he returns from his long haul truck driver.  He says he understands that she is dying.   We discussed that it is unlikely she will leave the hospital.  He understands.  We will have a conversation about transitioning to comfort measures.  Norvel RichardsMarianne Alma Muegge, PA-C Palliative Medicine Pager: [phone]

## 2017-04-16 NOTE — Evaluation (Signed)
Clinical/Bedside Swallow Evaluation Patient Details  Name: Sue Martin MRN: [id number] Date of Birth: [birth year]  Today's Date: 04/16/2017 Time: SLP Start Time (ACUTE ONLY): 1218 SLP Stop Time (ACUTE ONLY): 1240 SLP Time Calculation (min) (ACUTE ONLY): 22 min  Past Medical History:  Past Medical History:  Diagnosis Date  . Anxiety   . Arthritis    "throughout my body" (04/06/2017)  . Asthma   . Bipolar disorder (HCC)   . CAD (coronary artery disease) 12/12/2014   -s/p 2V PCI of the LAD/RCA with taxus stents -cardiologist is Dr. Carmon SailsKwawaja, Celedonio SavageUsman   . CHF (congestive heart failure) (HCC)    sees Dr. Wynonia HazardKhawaja  . Chronic back pain    "all over"  . Cirrhosis of liver (HCC)    Hattie Perch/notes 04/06/2017  . CKD (chronic kidney disease), stage III (HCC)    Hattie Perch/notes 04/06/2017  . Depression   . Diabetes mellitus with renal complications (HCC)    PVD and retinopathy, S/p bilat ambutations  . Fibromyalgia   . Glaucoma, bilateral   . Headache    "severely, 2/wk; maybe 2 not as severe" (04/06/2017)  . Heart murmur   . High cholesterol   . History of blood transfusion 2013   "when I had my legs done"  . Liver cirrhosis (HCC)   . Lyme disease   . On home oxygen therapy    "2L; 24/7" (04/06/2017)  . Retinal detachment    sees Dr. Johna SheriffPeter Rogaski per her report   Past Surgical History:  Past Surgical History:  Procedure Laterality Date  . CORONARY ANGIOPLASTY WITH STENT PLACEMENT     "I have 2-3 stents" (04/06/2017)  . ESOPHAGOGASTRODUODENOSCOPY (EGD) WITH PROPOFOL N/A 07/07/2016   Procedure: ESOPHAGOGASTRODUODENOSCOPY (EGD) WITH PROPOFOL;  Surgeon: Rachael Feeaniel P Jacobs, MD;  Location: [facility];  Service: Endoscopy;  Laterality: N/A;  . LASIK Bilateral   . LEG AMPUTATION BELOW KNEE Bilateral 09/09/2011- 09/11/2011  . RETINAL DETACHMENT SURGERY Right    "tried to fix it; it didn't take"  . TONSILLECTOMY    . TUBAL LIGATION     HPI:  51 y.o.femalewith past medical history of advanced systolic  and diastolic heart failure, cirrhosis, CKD stg 3, DM and bipolarwho was admitted on 10/11/2018with volume overload from CHF exacerbation. She has received maximal hospital treatment and unfortunately is not improving. She experiences recurrent hepatic encephalopathy. Her kidney function is beginning to decline significantly. Palliative following; per notes, "appears to be aspirating." Pt remains full code at this time, palliative to discuss DNR/ comfort feeding with husband. No history of swallowing difficulties in chart.    Assessment / Plan / Recommendation Clinical Impression  Patient presents with oropharyngeal swallow which appears grossly within functional limits. Pt swallowed thin water in excess of 3 oz in subsequent swallows on multiple occasions with no overt signs of aspiration. Pt is notably impulsive and drains cups without pausing. Despite this, her airway protection appeared adequate. As assessment continued, pt displayed belching followed by coughing after multiple consecutive swallows. I suspect this is less likely related to aspiration during the swallow and more likely suggestive of primary esophageal dysphagia. GI currently following. When SLP limited to single sips, coughing decreased, though belching did continue. Requires extended time for mastication of solids, though functional given that she is endentuolous. Oral clearance adequate. Pt does have small bore NG in place which was placed yesterday by RN due to somnolence and pt's inability to take medications orally. Today her cognition appears sufficient for PO intake. Spoke with RN  and advised that pt would benefit from cues to reduce rate of intake, particularly with liquids. If coughing with pills, attempt whole in puree. Would continue with regular diet, thin liquids; cue or assist pt with reducing rate of intake. Hold POs if pt is not fully alert. Pt educated re: recommendations as well, no further skilled ST needs identified,  will s/o.  SLP Visit Diagnosis: Dysphagia, unspecified (R13.10)    Aspiration Risk  Mild aspiration risk    Diet Recommendation Regular;Thin liquid   Liquid Administration via: Straw Medication Administration: Whole meds with puree Supervision: Patient able to self feed;Intermittent supervision to cue for compensatory strategies Compensations: Slow rate;Small sips/bites Postural Changes: Seated upright at 90 degrees;Remain upright for at least 30 minutes after po intake    Other  Recommendations Oral Care Recommendations: Oral care BID   Follow up Recommendations None      Frequency and Duration            Prognosis Prognosis for Safe Diet Advancement: Good Barriers to Reach Goals: Other (Comment) (comorbidities)      Swallow Study   General Date of Onset: 04/06/17 HPI: 51 y.o.femalewith past medical history of advanced systolic and diastolic heart failure, cirrhosis, CKD stg 3, DM and bipolarwho was admitted on 10/11/2018with volume overload from CHF exacerbation. She has received maximal hospital treatment and unfortunately is not improving. She experiences recurrent hepatic encephalopathy. Her kidney function is beginning to decline significantly. Palliative following; per notes, "appears to be aspirating." Pt remains full code at this time, palliative to discuss DNR/ comfort feeding with husband. No history of swallowing difficulties in chart.  Type of Study: Bedside Swallow Evaluation Previous Swallow Assessment: none in chart Diet Prior to this Study: Regular;Thin liquids Temperature Spikes Noted: No Respiratory Status: Nasal cannula History of Recent Intubation: No Behavior/Cognition: Alert;Cooperative;Confused Oral Cavity Assessment: Within Functional Limits Oral Care Completed by SLP: No Oral Cavity - Dentition: Edentulous Vision: Functional for self-feeding Self-Feeding Abilities: Able to feed self Patient Positioning: Upright in bed Baseline Vocal  Quality: Normal Volitional Cough: Strong Volitional Swallow: Able to elicit    Oral/Motor/Sensory Function Overall Oral Motor/Sensory Function: Within functional limits   Ice Chips Ice chips: Within functional limits   Thin Liquid Thin Liquid: Within functional limits    Nectar Thick Nectar Thick Liquid: Not tested   Honey Thick Honey Thick Liquid: Not tested   Puree Puree: Within functional limits Presentation: Self Fed;Spoon   Solid   GO   Rondel Baton, [place], CCC-SLP Speech-Language Pathologist [phone] Solid: Within functional limits Presentation: Self Fed        Arlana Lindau 04/16/2017,1:25 PM

## 2017-04-16 NOTE — Progress Notes (Signed)
Advanced Heart Failure Rounding Note  PCP:  Primary Cardiologist: Dr Novella Rob  Subjective:    Admitted with acute on chronic combined CHF with worsening SOB and edema. HF team asked to consult with initial mixed venous saturation of 52% on PICC line.   10/18 A Flutter/ SVT- started on amio.   Remains on lasix increase 60q + acetazolamide and metolazone. Urine output finally increasing. Down 3 pounds overnight. CVP still 29. Yesterday very lethargic and NGT tube placed for lactulose. Now much more alert. Ammonia 104->52. Denies SOB. Very weak. Co-ox 75%  Has met with Palliative Care and reports that she is DNR. They are discussing with family.   Objective:   Weight Range: 111.5 kg (245 lb 13 oz) Body mass index is 57.13 kg/m.   Vital Signs:   Temp:  [97.6 F (36.4 C)-98.4 F (36.9 C)] 97.6 F (36.4 C) (10/21 0739) Pulse Rate:  [57-82] 65 (10/21 0700) Resp:  [15-21] 21 (10/21 0700) BP: (87-111)/(52-73) 109/64 (10/21 0700) SpO2:  [92 %-100 %] 99 % (10/21 0700) Weight:  [111.5 kg (245 lb 13 oz)] 111.5 kg (245 lb 13 oz) (10/21 0434) Last BM Date: 04/16/17  Weight change: Filed Weights   04/14/17 0400 04/15/17 0417 04/16/17 0434  Weight: 114.1 kg (251 lb 8.7 oz) 112.5 kg (248 lb 0.3 oz) 111.5 kg (245 lb 13 oz)    Intake/Output:   Intake/Output Summary (Last 24 hours) at 04/16/17 1053 Last data filed at 04/16/17 0700  Gross per 24 hour  Intake            696.7 ml  Output                0 ml  Net            696.7 ml      Physical Exam  CVP 29 personally reviewed General:  Sitting up in bed HEENT: normal Neck: supple thick  Unable to see JVD. Carotids 2+ bilat; no bruits. No lymphadenopathy or thryomegaly appreciated. Cor: PMI nonpalpable RRR. 2/6 TR Lungs: clear anteriorly  Abdomen: soft, nontender, +markedly distended. tight No hepatosplenomegaly. No bruits or masses. Good bowel sounds. Extremities: s/p bilateral BKAs Neuro: more alert today cranial nerves  grossly intact. . Affect pleasant   Telemetry  NSR 70s Personally reviewed   EKG   NSR 68 this am (04/10/17) QTc 504.   Labs    CBC  Recent Labs  04/15/17 0403 04/16/17 0352  WBC 5.9 5.9  HGB 11.8* 11.9*  HCT 38.8 40.0  MCV 104.3* 104.7*  PLT 84* 74*   Basic Metabolic Panel  Recent Labs  04/15/17 0403 04/16/17 0352  NA 137 142  K 3.4* 2.5*  CL 90* 93*  CO2 34* 36*  GLUCOSE 102* 129*  BUN 54* 55*  CREATININE 2.32* 2.31*  CALCIUM 9.0 9.3  MG 2.6* 2.6*   Liver Function Tests No results for input(s): AST, ALT, ALKPHOS, BILITOT, PROT, ALBUMIN in the last 72 hours. No results for input(s): LIPASE, AMYLASE in the last 72 hours. Cardiac Enzymes No results for input(s): CKTOTAL, CKMB, CKMBINDEX, TROPONINI in the last 72 hours.  BNP: BNP (last 3 results)  Recent Labs  01/22/17 1723 04/06/17 1810 04/09/17 1129  BNP 474.8* 455.6* 459.2*    ProBNP (last 3 results) No results for input(s): PROBNP in the last 8760 hours.   D-Dimer No results for input(s): DDIMER in the last 72 hours. Hemoglobin A1C No results for input(s): HGBA1C in the last 72  hours. Fasting Lipid Panel No results for input(s): CHOL, HDL, LDLCALC, TRIG, CHOLHDL, LDLDIRECT in the last 72 hours. Thyroid Function Tests No results for input(s): TSH, T4TOTAL, T3FREE, THYROIDAB in the last 72 hours.  Invalid input(s): FREET3  Other results:   Imaging    Dg Abd Portable 1v  Result Date: 04/15/2017 CLINICAL DATA:  Nasogastric tube placement. Cirrhosis. Congestive heart failure. EXAM: PORTABLE ABDOMEN - 1 VIEW COMPARISON:  None. FINDINGS: A nasogastric tube is seen with tip in the body the stomach. Interstitial infiltrates seen in the lung bases, most likely due to pulmonary edema. No evidence of dilated bowel loops seen in visualized portion of the upper abdomen. IMPRESSION: Nasogastric tube tip overlies the body of the stomach. Electronically Signed   By: Earle Gell M.D.   On: 04/15/2017  17:35     Medications:     Scheduled Medications: . acetaZOLAMIDE  500 mg Oral BID  . aspirin  81 mg Oral Daily  . atorvastatin  20 mg Oral QPM  . Chlorhexidine Gluconate Cloth  6 each Topical Daily  . insulin aspart  0-20 Units Subcutaneous Q4H  . insulin glargine  15 Units Subcutaneous Daily  . lactulose  60 g Oral TID  . mouth rinse  15 mL Mouth Rinse BID  . metolazone  5 mg Oral Daily  . potassium chloride  60 mEq Oral BID  . rifaximin  550 mg Oral BID  . sodium chloride flush  3 mL Intravenous Q12H    Infusions: . sodium chloride    . amiodarone 30 mg/hr (04/16/17 0923)  . furosemide Stopped (04/16/17 0940)    PRN Medications: sodium chloride, acetaminophen, cyclobenzaprine, Gerhardt's butt cream, morphine CONCENTRATE, ondansetron (ZOFRAN) IV, sodium chloride flush, sodium chloride flush    Patient Profile   Sue Martin is a 51 y.o. 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  Admitted with acute on chronic combined CHF with worsening SOB and edema. HF team asked to consult with initial mixed venous saturation of 52% on PICC line.   Assessment/Plan   1. A/C Combined Systolic/Diastolic Heart Failure - ECHO 12/2016 EF 35%. Echo 04/11/17 EF 35% Grade III DD.  -Off milrinone with SVT -she has severe biventricular failure with R>>L HF. Despite high-dose diuretics diuresis has been slow. Seems to be picking up today. Will continue current regimen. Supp K.  - BP too low for other agents. - Suspect we may be nearing Palliative siutation  2. AKI on CKD Stage III/IV - Creatinine baseline ~ 2.2 - Remains steady with diuresis. Hopefully will improve as renal vein pressures come down  3. A/C Hypoxic Respiratory Failure  O2 sats 90% on 5 liters oxygen daily.   - ABG with chronic CO2 retention with pCO2 in 60s 4. CAD- PCI LAD and RCA.  --No S/S  Ischemia.  Not on bb with low  output hf. Continue asa and statin.  5. DMII- on sliding scale.  6. Cirrhosis with hepatic encephalopathy - Initial ammonia 81. Yesterday with severe encephalopathy. NGT placed for lactulose and rifaxin. Now improved - PLTs low due to splenic sequestration  7. 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 SVT - Load amio. Maintaining  NSR.  Keep K >4 and Mag > 2. Supplement K today  Overall poor response to therapy. With [facility] will continue Palliative Care discussions.   Length of Stay: 10  Rustin Erhart, Quillian Quince,  MD  04/16/2017, 10:53 AM  Advanced Heart Failure Team Pager [phone] (M-F; 7a - 4p)  Please contact [facility] for night-coverage after hours (4p -7a ) and weekends on [email or web address]

## 2017-04-16 NOTE — Consult Note (Signed)
Consultation Note Date: 04/16/2017   Patient Name: Sue Martin  DOB: December 20, 1965  MRN: [id number]  Age / Sex: 51 y.o., female  PCP: Terressa Koyanagi, DO Referring Physician: Dolores Patty, MD  Reason for Consultation: Establishing goals of care  HPI/Patient Profile: 51 y.o. female with past medical history of advanced systolic and diastolic heart failure, cirrhosis, CKD stg 3, DM and bipolar who was admitted on 04/06/2017 with volume overload from CHF exacerbation.  She has received maximal hospital treatment and unfortunately is not improving.  She experiences recurrent hepatic encephalopathy.  Her kidney function is beginning to decline significantly.  Clinical Assessment and Goals of Care:  I have reviewed medical records including EPIC notes, labs and imaging, received report from the bedside RN, assessed the patient and then left a voice mail for her husband to discuss diagnosis prognosis, GOC, EOL wishes, disposition and options.  I introduced Palliative Medicine as specialized medical care for people living with serious illness. It focuses on providing relief from the symptoms and stress of a serious illness. The goal is to improve quality of life for both the patient and the family.  In speaking with the patient at bedside - she was orientated and able to tell me Trump was president, that it is October and Halloween is this month.  She is from [place] originally.  Her husband Nedra Hai is her medical Runner, broadcasting/film/video.  He is an overnight truck driver.  She stated she is in the hospital because she went to several different MDs who were not able to help her so she felt that she needed to come here.  We discussed wounds on her body.    I asked if she was in pain.  She replied "I hurt all over just from being here".  Finally we discussed Code Status - If she were so sick that she suffered respiratory  or cardiac arrest she would want to be made comfortable "let me go".  She would not want to be resuscitated.   I feel I would need to confirm this with Nedra Hai prior to changing her orders to DNR.  Primary Decision Maker:  PATIENT.  Surrogate decision maker is her husband Nedra Hai    SUMMARY OF RECOMMENDATIONS    Family meeting with patient and family to discuss health status and goals and to confirm DNR.   I have left a voice mail for her husband.  Code Status/Advance Care Planning:  Full code.   Symptom Management:   Will add oral morphine prn dyspnea or pain.  Will check swallow eval.  Appears to be aspirating.  Will likely need to discuss comfort feeds with husband.   Psycho-social/Spiritual:   Desire for further Chaplaincy support: No  Prognosis: days pending GOC decisions.  Days in the setting of advanced heart failure - not responding to therapy, cirrhosis, and declining kidney function.    Discharge Planning: To Be Determined      Primary Diagnoses: Present on Admission: . CAD (coronary artery disease) . Cirrhosis (HCC) .  Cardiomyopathy, ischemic . CKD (chronic kidney disease) stage 3, GFR 30-59 ml/min (HCC) . Type II diabetes mellitus with ophthalmic manifestations, uncontrolled (HCC) . Acute on chronic systolic CHF (congestive heart failure) (HCC) . Hyperlipidemia   I have reviewed the medical record, interviewed the patient and family, and examined the patient. The following aspects are pertinent.  Past Medical History:  Diagnosis Date  . Anxiety   . Arthritis    "throughout my body" (04/06/2017)  . Asthma   . Bipolar disorder (HCC)   . CAD (coronary artery disease) 12/12/2014   -s/p 2V PCI of the LAD/RCA with taxus stents -cardiologist is Dr. Carmon SailsKwawaja, Celedonio SavageUsman   . CHF (congestive heart failure) (HCC)    sees Dr. Wynonia HazardKhawaja  . Chronic back pain    "all over"  . Cirrhosis of liver (HCC)    Hattie Perch/notes 04/06/2017  . CKD (chronic kidney disease), stage III (HCC)     Hattie Perch/notes 04/06/2017  . Depression   . Diabetes mellitus with renal complications (HCC)    PVD and retinopathy, S/p bilat ambutations  . Fibromyalgia   . Glaucoma, bilateral   . Headache    "severely, 2/wk; maybe 2 not as severe" (04/06/2017)  . Heart murmur   . High cholesterol   . History of blood transfusion 2013   "when I had my legs done"  . Liver cirrhosis (HCC)   . Lyme disease   . On home oxygen therapy    "2L; 24/7" (04/06/2017)  . Retinal detachment    sees Dr. Johna SheriffPeter Rogaski per her report   Social History   Social History  . Marital status: Married    Spouse name: N/A  . Number of children: N/A  . Years of education: N/A   Occupational History  . Disabled    Social History Main Topics  . Smoking status: Never Smoker  . Smokeless tobacco: Never Used  . Alcohol use 0.0 oz/week     Comment: 04/06/2017 "maybe 4 times/year; holidays; 1 drink at each occasion"  . Drug use: No  . Sexual activity: Not Currently   Other Topics Concern  . None   Social History Narrative   Married   Husband is a Naval architecttruck driver, he is home 3 days/month   Son and BFF live there and help her.      Family History  Problem Relation Age of Onset  . Arthritis Mother   . Heart disease Mother   . Mental illness Mother   . Diabetes Mother   . Arthritis Maternal Grandmother   . Diabetes Maternal Grandmother   . Arthritis Father   . CVA Father   . Hypertension Father   . Sudden death Paternal Grandfather    Scheduled Meds: . acetaZOLAMIDE  500 mg Oral BID  . aspirin  81 mg Oral Daily  . atorvastatin  20 mg Oral QPM  . Chlorhexidine Gluconate Cloth  6 each Topical Daily  . insulin aspart  0-20 Units Subcutaneous Q4H  . insulin glargine  15 Units Subcutaneous Daily  . lactulose  60 g Oral TID  . mouth rinse  15 mL Mouth Rinse BID  . metolazone  5 mg Oral Daily  . potassium chloride  60 mEq Oral BID  . rifaximin  550 mg Oral BID  . sodium chloride flush  3 mL Intravenous Q12H    Continuous Infusions: . sodium chloride    . amiodarone 30 mg/hr (04/15/17 2134)  . furosemide 160 mg (04/16/17 0840)  . potassium chloride 10  mEq (04/16/17 0805)   PRN Meds:.sodium chloride, acetaminophen, cyclobenzaprine, Gerhardt's butt cream, ondansetron (ZOFRAN) IV, sodium chloride flush, sodium chloride flush Allergies  Allergen Reactions  . Broccoli [Brassica Oleracea Italica] Anaphylaxis  . Influenza Vaccines Anaphylaxis    Per patient  . Pneumococcal Vaccines Anaphylaxis    Per patient  . Trulicity [Dulaglutide] Hives and Nausea And Vomiting  . Amoxicillin Nausea And Vomiting  . Doxycycline Hives and Nausea And Vomiting  . Iron Other (See Comments)    GI intolerance  . Latex Other (See Comments)    blisters  . Lisinopril Nausea Only  . Penicillins Other (See Comments)    GI intolerance, UTI, can tolerate in IV Tolerates cephalosporins Has patient had a PCN reaction causing immediate rash, facial/tongue/throat swelling, SOB or lightheadedness with hypotension: No Has patient had a PCN reaction causing severe rash involving mucus membranes or skin necrosis: No Has patient had a PCN reaction that required hospitalization: No Has patient had a PCN reaction occurring within the last 10 years: No If all of the above answers are "NO", then may proceed with Cephalosporin   . Tape Other (See Comments)    Tears skin.  Please use "paper" tape  . Codeine Nausea And Vomiting and Rash  . Sulfa Antibiotics Nausea And Vomiting and Rash  . Sulfur Nausea And Vomiting and Rash   Review of Systems pain all over  Physical Exam  Well developed obese female, poor color, Alert, orientated, cooperative CV decreased heart sounds Resp no distress, on N/C Abdomen obese, soft, nd Ext bilateral BKA  Vital Signs: BP 109/64   Pulse 65   Temp 97.6 F (36.4 C) (Oral)   Resp (!) 21   Ht 4\' 7"  (1.397 m)   Wt 111.5 kg (245 lb 13 oz)   LMP 12/22/2016   SpO2 99%   BMI 57.13 kg/m  Pain  Assessment: No/denies pain   Pain Score: Asleep   SpO2: SpO2: 99 % O2 Device:SpO2: 99 % O2 Flow Rate: .O2 Flow Rate (L/min): 4 L/min  IO: Intake/output summary:  Intake/Output Summary (Last 24 hours) at 04/16/17 0910 Last data filed at 04/16/17 0700  Gross per 24 hour  Intake            713.4 ml  Output                0 ml  Net            713.4 ml    LBM: Last BM Date: 04/16/17 Baseline Weight: Weight: 117.9 kg (260 lb) Most recent weight: Weight: 111.5 kg (245 lb 13 oz)     Palliative Assessment/Data:     Time In: 9:00 Time Out: 10:00 Time Total: 60 min. Greater than 50%  of this time was spent counseling and coordinating care related to the above assessment and plan.  Signed by: Norvel Richards, PA-C Palliative Medicine Pager: [phone]  Please contact Palliative Medicine Team phone at [phone] for questions and concerns.  For individual provider: See Loretha Stapler

## 2017-04-17 DIAGNOSIS — Z7189 Other specified counseling: Secondary | ICD-10-CM

## 2017-04-17 LAB — GLUCOSE, CAPILLARY
Glucose-Capillary: 131 mg/dL — ABNORMAL HIGH (ref 65–99)
Glucose-Capillary: 100 mg/dL — ABNORMAL HIGH (ref 65–99)
Glucose-Capillary: 138 mg/dL — ABNORMAL HIGH (ref 65–99)
Glucose-Capillary: 126 mg/dL — ABNORMAL HIGH (ref 65–99)
Glucose-Capillary: 148 mg/dL — ABNORMAL HIGH (ref 65–99)

## 2017-04-17 LAB — COOXEMETRY PANEL
Carboxyhemoglobin: 1.6 % — ABNORMAL HIGH (ref 0.5–1.5)
Methemoglobin: 1 % (ref 0.0–1.5)
O2 Saturation: 81.5 %
TOTAL HEMOGLOBIN: 12.1 g/dL (ref 12.0–16.0)

## 2017-04-17 LAB — BASIC METABOLIC PANEL WITH GFR
Anion gap: 12 (ref 5–15)
BUN: 51 mg/dL — ABNORMAL HIGH (ref 6–20)
CO2: 34 mmol/L — ABNORMAL HIGH (ref 22–32)
Calcium: 9.3 mg/dL (ref 8.9–10.3)
Chloride: 96 mmol/L — ABNORMAL LOW (ref 101–111)
Creatinine, Ser: 2.3 mg/dL — ABNORMAL HIGH (ref 0.44–1.00)
GFR calc Af Amer: 27 mL/min — ABNORMAL LOW
GFR calc non Af Amer: 24 mL/min — ABNORMAL LOW
Glucose, Bld: 192 mg/dL — ABNORMAL HIGH (ref 65–99)
Potassium: 3.1 mmol/L — ABNORMAL LOW (ref 3.5–5.1)
Sodium: 142 mmol/L (ref 135–145)

## 2017-04-17 LAB — BASIC METABOLIC PANEL
Anion gap: 9 (ref 5–15)
BUN: 52 mg/dL — AB (ref 6–20)
CHLORIDE: 97 mmol/L — AB (ref 101–111)
CO2: 35 mmol/L — AB (ref 22–32)
CREATININE: 2.24 mg/dL — AB (ref 0.44–1.00)
Calcium: 9.1 mg/dL (ref 8.9–10.3)
GFR calc Af Amer: 28 mL/min — ABNORMAL LOW (ref >60)
GFR calc non Af Amer: 24 mL/min — ABNORMAL LOW (ref >60)
GLUCOSE: 135 mg/dL — AB (ref 65–99)
POTASSIUM: 2.4 mmol/L — AB (ref 3.5–5.1)
SODIUM: 141 mmol/L (ref 135–145)

## 2017-04-17 LAB — CBC
HEMATOCRIT: 39.8 % (ref 36.0–46.0)
HEMOGLOBIN: 11.8 g/dL — AB (ref 12.0–15.0)
MCH: 31.2 pg (ref 26.0–34.0)
MCHC: 29.6 g/dL — AB (ref 30.0–36.0)
MCV: 105.3 fL — AB (ref 78.0–100.0)
Platelets: 74 10*3/uL — ABNORMAL LOW (ref 150–400)
RBC: 3.78 MIL/uL — ABNORMAL LOW (ref 3.87–5.11)
RDW: 16.6 % — AB (ref 11.5–15.5)
WBC: 5.5 10*3/uL (ref 4.0–10.5)

## 2017-04-17 LAB — POTASSIUM: POTASSIUM: 2.6 mmol/L — AB (ref 3.5–5.1)

## 2017-04-17 LAB — MAGNESIUM: MAGNESIUM: 2.6 mg/dL — AB (ref 1.7–2.4)

## 2017-04-17 LAB — AMMONIA: Ammonia: 49 umol/L — ABNORMAL HIGH (ref 9–35)

## 2017-04-17 MED ORDER — POTASSIUM CHLORIDE 10 MEQ/50ML IV SOLN
10.0000 meq | INTRAVENOUS | Status: AC
  Administered 2017-04-18 (×6): 10 meq via INTRAVENOUS
  Filled 2017-04-17 (×6): qty 50

## 2017-04-17 MED ORDER — POTASSIUM CHLORIDE 20 MEQ/15ML (10%) PO SOLN
40.0000 meq | Freq: Three times a day (TID) | ORAL | Status: DC
  Administered 2017-04-17: 40 meq via ORAL
  Filled 2017-04-17 (×2): qty 30

## 2017-04-17 MED ORDER — POTASSIUM CHLORIDE 10 MEQ/50ML IV SOLN
10.0000 meq | INTRAVENOUS | Status: AC
  Administered 2017-04-17 (×4): 10 meq via INTRAVENOUS
  Filled 2017-04-17 (×4): qty 50

## 2017-04-17 MED ORDER — LORAZEPAM 2 MG/ML PO CONC: 0.5000 mg | Freq: Four times a day (QID) | ORAL | Status: DC | PRN

## 2017-04-17 MED ORDER — POTASSIUM CHLORIDE 20 MEQ/15ML (10%) PO SOLN
40.0000 meq | Freq: Once | ORAL | Status: AC
  Administered 2017-04-17: 40 meq
  Filled 2017-04-17: qty 30

## 2017-04-17 MED ORDER — LORAZEPAM 0.5 MG PO TABS: 0.5000 mg | ORAL_TABLET | Freq: Four times a day (QID) | ORAL | Status: DC | PRN

## 2017-04-17 NOTE — Progress Notes (Signed)
Daily Progress Note   Patient Name: Sue Martin       Date: 04/17/2017 DOB: 12-11-1965  Age: 51 y.o. MRN#: [id number] Attending Physician: Jolaine Artist, MD Primary Care Physician: Lucretia Kern, DO Admit Date: 04/06/2017  Reason for Consultation/Follow-up: Establishing goals of care  Subjective: Met with patient and family at bedside.  When I attempted to speak with family privately the patient said NO and insisted we have the conversation at bedside.  After some discussion/jokes and laughter.  The patient made the decision to go to [facility] on discharge.  I clearly explained that people go there to have a comfortable passing.  She understood and said with a tear in her eye that she was making this decision herself.    Patient requested her diet be liberalized.  I spoke with husband to clearly explain that once she is at [facility] and the focus is full comfort her time frame will be very short (days).  He understands.  Discussed with bedside RN and Dr. Haroldine Laws.    Assessment: Patient with end stage heart failure, cirrhosis, and CKD.  [place].   Patient Profile/HPI:  51 y.o. female with past medical history of advanced systolic and diastolic heart failure, cirrhosis, CKD stg 3, DM and bipolar who was admitted on 04/06/2017 with volume overload from CHF exacerbation.  She has received maximal hospital treatment and unfortunately is not improving.  She experiences recurrent hepatic encephalopathy.  Her kidney function is beginning to decline significantly.    Length of Stay: 11  Current Medications: Scheduled Meds:  . acetaZOLAMIDE  500 mg Oral BID  . Chlorhexidine Gluconate Cloth  6 each Topical Daily  . insulin aspart  0-20 Units Subcutaneous Q4H  . insulin  glargine  15 Units Subcutaneous Daily  . lactulose  60 g Oral TID  . mouth rinse  15 mL Mouth Rinse BID  . metolazone  5 mg Oral Daily  . potassium chloride  40 mEq Oral TID  . rifaximin  550 mg Oral BID  . sodium chloride flush  3 mL Intravenous Q12H    Continuous Infusions: . sodium chloride    . amiodarone 30 mg/hr (04/17/17 0915)  . furosemide Stopped (04/17/17 1512)    PRN Meds: sodium chloride, acetaminophen, cyclobenzaprine, Gerhardt's butt cream, LORazepam, morphine CONCENTRATE, ondansetron (ZOFRAN)  IV, sodium chloride flush, sodium chloride flush  Physical Exam        Awake, alert, coherent, more energetic today.  Family at bedside. N/G in place.  Vital Signs: BP 117/73   Pulse 65   Temp 97.6 F (36.4 C) (Oral)   Resp (!) 27   Ht _0  (1.397 m)   Wt 108.2 kg (238 lb 8.6 oz)   LMP 12/22/2016   SpO2 100%   BMI 55.44 kg/m  SpO2: SpO2: 100 % O2 Device: O2 Device: Nasal Cannula O2 Flow Rate: O2 Flow Rate (L/min): 5 L/min  Intake/output summary:  Intake/Output Summary (Last 24 hours) at 04/17/17 1525 Last data filed at 04/17/17 1500  Gross per 24 hour  Intake           1096.8 ml  Output             2000 ml  Net           -903.2 ml   LBM: Last BM Date: 04/17/17 Baseline Weight: Weight: 117.9 kg (260 lb) Most recent weight: Weight: 108.2 kg (238 lb 8.6 oz)       Palliative Assessment/Data: 30%      Patient Active Problem List   Diagnosis Date Noted  . Pressure injury of skin 04/16/2017  . Palliative care encounter   . DNR (do not resuscitate) discussion   . Hepatic encephalopathy ([place])   . Acute renal failure with acute tubular necrosis superimposed on stage 4 chronic kidney disease ([place])   . Jaundice   . Elevated bilirubin   . CHF (congestive heart failure) ([place]) 04/06/2017  . Acute pulmonary edema (HCC)   . Chest pain 01/22/2017  . Acute on chronic respiratory failure with hypoxia ([place]) 01/22/2017  . Thrombocytopenia ([place]) 01/22/2017  .  Cardiomyopathy, ischemic 06/13/2016  . Type II diabetes mellitus with ophthalmic manifestations, uncontrolled ([place]) 12/22/2014  . Hyperlipidemia 12/22/2014  . CAD (coronary artery disease) 12/12/2014  . Diabetic retinopathy ([place]) 12/12/2014  . PVD (peripheral vascular disease) ([place]) 12/12/2014  . CKD (chronic kidney disease) stage 3, GFR 30-59 ml/min (HCC) 12/12/2014  . Type 2 diabetes mellitus with diabetic nephropathy ([place]) 11/28/2014  . Acute on chronic systolic CHF (congestive heart failure) ([place]) 11/28/2014  . Cirrhosis ([place]) 11/28/2014  . Generalized OA 11/28/2014  . S/P bilateral BKA (below knee amputation) ([place]) 11/28/2014    Palliative Care Plan    Recommendations/Plan:  Discharge to hospice house when medically optimized and as comfortable as possible.  N/G discontinued - not needed for lactulose as patient can take it orally.  Liberalized diet at patient's request (recognizing that this may limit her diureses somewhat)  Social work consult for hospice house.  Beds are full at hospice house at this time.  Goals of Care and Additional Recommendations:  Limitations on Scope of Treatment: Continue with diuresis and care per Heart Failure team with a leaning towards comfort.  Code Status:  DNR  Prognosis:   < 2 weeks - secondary to end stage liver disease with recurrent encephalopathy, end stage HF with chronic respiratory failure, CKD.   Discharge Planning:  Hospice facility  Care plan was discussed with Family, bedside RN, attending physician  Thank you for allowing the Palliative Medicine Team to assist in the care of this patient.  Total time spent:  35 min.     Greater than 50%  of this time was spent counseling and coordinating care related to the above assessment and plan.  Florentina Jenny, PA-C Palliative  Medicine  Please contact Palliative MedicineTeam phone at [phone] for questions and concerns between 7 am - 7 pm.   Please see AMION for  individual provider pager numbers.

## 2017-04-17 NOTE — Progress Notes (Addendum)
Advanced Heart Failure Rounding Note  PCP:  Primary Cardiologist: Dr Wynonia Hazard  Subjective:    Admitted with acute on chronic combined CHF with worsening SOB and edema. HF team asked to consult with initial mixed venous saturation of 52% on PICC line.   10/18 A Flutter/ SVT- started on amio.   Coox 81.5% this am. I/O + 250 cc ( No UO recorded as foley pulled and marked incontinence) on  lasix 160 q 8 + acetazolamide and metolazone. (Though no UO recorded). Weight shows down 7 lbs.  CVP 28  NGT placed 04/15/17 for lactulose. Encephalopathy improved.   Feeling a little better this am. UO picking up, but refuses foley or Purewick. Says she would rather "pee on herself". Intermittently SOB. Husband coming tonight to discuss likely transition to comfort care. She is DNR  K 2.4 this am. Supp ordered.   Objective:   Weight Range: 238 lb 8.6 oz (108.2 kg) Body mass index is 55.44 kg/m.   Vital Signs:   Temp:  [97.4 F (36.3 C)-98.1 F (36.7 C)] 98.1 F (36.7 C) (10/22 0325) Pulse Rate:  [59-92] 62 (10/22 0700) Resp:  [15-28] 15 (10/22 0600) BP: (94-127)/(55-81) 100/71 (10/22 0700) SpO2:  [62 %-100 %] 100 % (10/22 0700) FiO2 (%):  [100 %] 100 % (10/21 1409) Weight:  [238 lb 8.6 oz (108.2 kg)] 238 lb 8.6 oz (108.2 kg) (10/22 0400) Last BM Date: 04/16/17  Weight change: Filed Weights   04/15/17 0417 04/16/17 0434 04/17/17 0400  Weight: 248 lb 0.3 oz (112.5 kg) 245 lb 13 oz (111.5 kg) 238 lb 8.6 oz (108.2 kg)    Intake/Output:   Intake/Output Summary (Last 24 hours) at 04/17/17 0736 Last data filed at 04/17/17 0700  Gross per 24 hour  Intake           1654.8 ml  Output             1400 ml  Net            254.8 ml      Physical Exam  CVP 28 personally reviewed.   General: Sitting up in bed. NAD.  HEENT: Normal Neck: Supple. JVP to jaw. Carotids 2+ bilat; no bruits. No thyromegaly or nodule noted. Cor: PMI nondisplaced. RRR, No 2/6 TR Lungs: Diminished basilar  sounds. Abdomen: Non-tender, + markedly distended. No HSM. No bruits or masses. +BS  Extremities: s/p bilateral BKAs.  Neuro: Alert & orientedx3, cranial nerves grossly intact. moves all 4 extremities w/o difficulty. Affect flat but appropriate  Telemetry   NSR 70s, Personally reviewed.   EKG   NSR 68 this am (04/10/17) QTc 504.  Labs    CBC  Recent Labs  04/16/17 0352 04/17/17 0409  WBC 5.9 5.5  HGB 11.9* 11.8*  HCT 40.0 39.8  MCV 104.7* 105.3*  PLT 74* 74*   Basic Metabolic Panel  Recent Labs  04/16/17 0352 04/16/17 1250 04/17/17 0409  NA 142  --  141  K 2.5* 3.5 2.4*  CL 93*  --  97*  CO2 36*  --  35*  GLUCOSE 129*  --  135*  BUN 55*  --  52*  CREATININE 2.31*  --  2.24*  CALCIUM 9.3  --  9.1  MG 2.6*  --  2.6*   Liver Function Tests No results for input(s): AST, ALT, ALKPHOS, BILITOT, PROT, ALBUMIN in the last 72 hours. No results for input(s): LIPASE, AMYLASE in the last 72 hours. Cardiac Enzymes No results  for input(s): CKTOTAL, CKMB, CKMBINDEX, TROPONINI in the last 72 hours.  BNP: BNP (last 3 results)  Recent Labs  01/22/17 1723 04/06/17 1810 04/09/17 1129  BNP 474.8* 455.6* 459.2*    ProBNP (last 3 results) No results for input(s): PROBNP in the last 8760 hours.   D-Dimer No results for input(s): DDIMER in the last 72 hours. Hemoglobin A1C No results for input(s): HGBA1C in the last 72 hours. Fasting Lipid Panel No results for input(s): CHOL, HDL, LDLCALC, TRIG, CHOLHDL, LDLDIRECT in the last 72 hours. Thyroid Function Tests No results for input(s): TSH, T4TOTAL, T3FREE, THYROIDAB in the last 72 hours.  Invalid input(s): FREET3  Other results:  Imaging   No results found.  Medications:    Scheduled Medications: . acetaZOLAMIDE  500 mg Oral BID  . aspirin  81 mg Oral Daily  . atorvastatin  20 mg Oral QPM  . Chlorhexidine Gluconate Cloth  6 each Topical Daily  . insulin aspart  0-20 Units Subcutaneous Q4H  . insulin  glargine  15 Units Subcutaneous Daily  . lactulose  60 g Oral TID  . mouth rinse  15 mL Mouth Rinse BID  . metolazone  5 mg Oral Daily  . potassium chloride  20 mEq Oral TID  . rifaximin  550 mg Oral BID  . sodium chloride flush  3 mL Intravenous Q12H    Infusions: . sodium chloride    . amiodarone 30 mg/hr (04/16/17 2139)  . furosemide Stopped (04/16/17 2233)  . potassium chloride 10 mEq (04/17/17 0644)   PRN Medications: sodium chloride, acetaminophen, cyclobenzaprine, Gerhardt's butt cream, morphine CONCENTRATE, ondansetron (ZOFRAN) IV, sodium chloride flush, sodium chloride flush  Patient Profile   Sue Martin is a 51 y.o. 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  Admitted with acute on chronic combined CHF with worsening SOB and edema. HF team asked to consult with initial mixed venous saturation of 52% on PICC line.   Assessment/Plan   1. A/C Combined Systolic/Diastolic Heart Failure - ECHO 06/6107 EF 35%. Echo 04/11/17 EF 35% Grade III DD.  -Off milrinone with SVT -she has severe biventricular failure with R>>L HF.  - Diuresis seems to have picked up over past two days, though no UO recorded yesterday due to marked incontinence. Bed being changed every 2 hours due to UO.  - Continue lasix 160 q 8 hrs - Continue metolazone 5 mg daily - Continue diamox 500 mg BID - BP too low for other agents. - Suspect we may be nearing Palliative siutation. Husband to arrive tonight to discuss further Comfort Care consideration.  2. AKI on CKD Stage III/IV - Creatinine baseline ~ 2.2 - Stable with diuresis. Continue to follow closely.  3. A/C Hypoxic Respiratory Failure  O2 sats 90% on 5 liters oxygen daily.  No change.  - ABG with chronic CO2 retention with pCO2 in 60s 4. CAD- PCI LAD and RCA.  - No s/s of ischemia.    - Not on bb with low output hf. Continue asa and statin.  5.  DMII - On sliding scale.  6. Cirrhosis with hepatic encephalopathy - Initial ammonia 81. 03/2017 developed severe encephalopathy. NGT placed for lactulose and rifaxin. Now improved.  - PLTs low due to splenic sequestration  7. 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 SVT - Load amio. Maintaining  NSR.  - Keep K >4 and Mag >  2. S 10. Hypokalemia - Will aggressively supplement today with improved UO.   Overall poor response to therapy. With MSOF will continue Palliative Care discussions. Husband to arrive this evening to discuss possibility of comfort care.   Length of Stay: [address]  Sue SchoolMichael Andrew Tillery, PA-C  04/17/2017, 7:36 AM  Advanced Heart Failure Team Pager [phone] (M-F; 7a - 4p)  Please contact [facility] for night-coverage after hours (4p -7a ) and weekends on [email or web address]  Patient seen and examined with the above-signed Advanced Practice Provider and/or Housestaff. I personally reviewed laboratory data, imaging studies and relevant notes. I independently examined the patient and formulated the important aspects of the plan. I have edited the note to reflect any of my changes or salient points. I have personally discussed the plan with the patient and/or family.  She remains very tenuous. But volume now coming off and she is more clear with lactulose. Renal function improving. Potassium very low. Will continue current regimen. Will discuss GOC with her and her family. She is finally making a bit of progress and perhaps we can stabilize enough to give her a bit more time.   Arvilla MeresBensimhon, Deondrae Mcgrail, MD  8:42 AM

## 2017-04-17 NOTE — Progress Notes (Signed)
Critical potassium level of 2.4.  Dr. Cristal Deerhristopher notified. Orders received for potassium replacement.

## 2017-04-18 LAB — COOXEMETRY PANEL
CARBOXYHEMOGLOBIN: 1.6 % — AB (ref 0.5–1.5)
Methemoglobin: 1.1 % (ref 0.0–1.5)
O2 Saturation: 62.6 %
Total hemoglobin: 11.8 g/dL — ABNORMAL LOW (ref 12.0–16.0)

## 2017-04-18 LAB — BASIC METABOLIC PANEL
Anion gap: 13 (ref 5–15)
Anion gap: 12 (ref 5–15)
BUN: 52 mg/dL — AB (ref 6–20)
BUN: 53 mg/dL — AB (ref 6–20)
CALCIUM: 9.1 mg/dL (ref 8.9–10.3)
CHLORIDE: 96 mmol/L — AB (ref 101–111)
CHLORIDE: 97 mmol/L — AB (ref 101–111)
CO2: 33 mmol/L — ABNORMAL HIGH (ref 22–32)
CO2: 33 mmol/L — AB (ref 22–32)
CREATININE: 2.44 mg/dL — AB (ref 0.44–1.00)
Calcium: 9.3 mg/dL (ref 8.9–10.3)
Creatinine, Ser: 2.3 mg/dL — ABNORMAL HIGH (ref 0.44–1.00)
GFR calc Af Amer: 27 mL/min — ABNORMAL LOW (ref >60)
GFR calc Af Amer: 25 mL/min — ABNORMAL LOW (ref >60)
GFR calc non Af Amer: 24 mL/min — ABNORMAL LOW (ref >60)
GFR calc non Af Amer: 22 mL/min — ABNORMAL LOW (ref >60)
GLUCOSE: 106 mg/dL — AB (ref 65–99)
GLUCOSE: 115 mg/dL — AB (ref 65–99)
POTASSIUM: 3.1 mmol/L — AB (ref 3.5–5.1)
Potassium: 3.7 mmol/L (ref 3.5–5.1)
Sodium: 142 mmol/L (ref 135–145)
Sodium: 142 mmol/L (ref 135–145)

## 2017-04-18 LAB — GLUCOSE, CAPILLARY
GLUCOSE-CAPILLARY: 88 mg/dL (ref 65–99)
GLUCOSE-CAPILLARY: 108 mg/dL — AB (ref 65–99)
GLUCOSE-CAPILLARY: 99 mg/dL (ref 65–99)
Glucose-Capillary: 100 mg/dL — ABNORMAL HIGH (ref 65–99)
Glucose-Capillary: 102 mg/dL — ABNORMAL HIGH (ref 65–99)
Glucose-Capillary: 145 mg/dL — ABNORMAL HIGH (ref 65–99)
Glucose-Capillary: 97 mg/dL (ref 65–99)

## 2017-04-18 LAB — CBC
HEMATOCRIT: 40 % (ref 36.0–46.0)
HEMOGLOBIN: 11.6 g/dL — AB (ref 12.0–15.0)
MCH: 30.7 pg (ref 26.0–34.0)
MCHC: 29 g/dL — AB (ref 30.0–36.0)
MCV: 105.8 fL — ABNORMAL HIGH (ref 78.0–100.0)
Platelets: 75 10*3/uL — ABNORMAL LOW (ref 150–400)
RBC: 3.78 MIL/uL — ABNORMAL LOW (ref 3.87–5.11)
RDW: 16.8 % — ABNORMAL HIGH (ref 11.5–15.5)
WBC: 7.1 10*3/uL (ref 4.0–10.5)

## 2017-04-18 LAB — MAGNESIUM: MAGNESIUM: 2.5 mg/dL — AB (ref 1.7–2.4)

## 2017-04-18 MED ORDER — AMIODARONE HCL 200 MG PO TABS
200.0000 mg | ORAL_TABLET | Freq: Two times a day (BID) | ORAL | Status: DC
  Administered 2017-04-18: 200 mg via ORAL
  Filled 2017-04-18 (×2): qty 1

## 2017-04-18 MED ORDER — LACTULOSE 10 GM/15ML PO SOLN
45.0000 g | Freq: Three times a day (TID) | ORAL | Status: DC
  Administered 2017-04-18: 45 g via ORAL
  Filled 2017-04-18 (×2): qty 90

## 2017-04-18 MED ORDER — LACTULOSE 10 GM/15ML PO SOLN
40.0000 g | Freq: Three times a day (TID) | ORAL | Status: DC
  Administered 2017-04-18: 40 g via ORAL
  Administered 2017-04-19: 10 g via ORAL
  Filled 2017-04-18 (×2): qty 60

## 2017-04-18 MED ORDER — POTASSIUM CHLORIDE 10 MEQ/50ML IV SOLN
10.0000 meq | INTRAVENOUS | Status: AC
  Administered 2017-04-18 (×6): 10 meq via INTRAVENOUS
  Filled 2017-04-18 (×6): qty 50

## 2017-04-18 NOTE — Plan of Care (Signed)
Problem: Education: Goal: Ability to demonstrate managment of disease process will improve Outcome: Not Progressing Patient is intermittently confused and does not seem to understand the gravity of her illness. We have discussed at length the things she will need ot do in order to have some quality time left and she seems to understand at the time of the discussion but then later on she can't grasp the information previously discussed.

## 2017-04-18 NOTE — Progress Notes (Addendum)
Dr. Kym Groom. Turner informed of pt vomiting after medication administration.  New order for Potassium lab draw. Pt will get Potassium IV replacement. Pt will receive morning labs as already ordered which will include Potassium and Ammonia levels. No new orders in regards to pt not keeping down lactulose.

## 2017-04-18 NOTE — Progress Notes (Signed)
Notified Osborne Cascoadia, CSW of patient's [facility]. CSW will assist with disposition.

## 2017-04-18 NOTE — Care Management Important Message (Signed)
Important Message  Patient Details  Name: Sue Martin MRN: [id number] Date of Birth: [birth year]   Medicare Important Message Given:  Yes    Lawerance Sabalebbie Geremy Rister, RN 04/18/2017, 4:49 PM

## 2017-04-18 NOTE — Progress Notes (Signed)
[facility] do not currently [facility].   Osborne Cascoadia Dajion Bickford LCSWA [phone]

## 2017-04-18 NOTE — Progress Notes (Signed)
Advanced Heart Failure Rounding Note  Primary Cardiologist: Dr Wynonia HazardKhawaja  Subjective:    Admitted with acute on chronic combined CHF with worsening SOB and edema. HF team asked to consult with initial mixed venous saturation of 52% on PICC line.   10/18 A Flutter/ SVT- started on amio.   Coox 62.6%  Negative 1.0 L (recorded) and down 5 lbs. I/O inaccurate due to incontinence. CVP 27-28  NGT placed 04/15/17 for lactulose. Encephalopathy improved.   Feeling slightly better. Down this morning after hospice talks last night with palliative and family. Poor appetite per RN. UO improved. Tolerating non-latex foley just fine.   K 2.6 last night. Got 60 meq of IV K overnight. Does not tolerate 20 meq tablets.   Objective:   Weight Range: 233 lb 11 oz (106 kg) Body mass index is 54.31 kg/m.   Vital Signs:   Temp:  [97.4 F (36.3 C)-98.2 F (36.8 C)] 97.4 F (36.3 C) (10/22 2000) Pulse Rate:  [25-70] 63 (10/23 0600) Resp:  [13-27] 20 (10/23 0600) BP: (97-130)/(59-88) 108/65 (10/23 0600) SpO2:  [91 %-100 %] 95 % (10/23 0600) Weight:  [233 lb 11 oz (106 kg)] 233 lb 11 oz (106 kg) (10/23 0500) Last BM Date: 04/17/17  Weight change: Filed Weights   04/16/17 0434 04/17/17 0400 04/18/17 0500  Weight: 245 lb 13 oz (111.5 kg) 238 lb 8.6 oz (108.2 kg) 233 lb 11 oz (106 kg)   Intake/Output:   Intake/Output Summary (Last 24 hours) at 04/18/17 0720 Last data filed at 04/18/17 0700  Gross per 24 hour  Intake          2006.81 ml  Output             3050 ml  Net         -1043.19 ml    Physical Exam   CVP remains markedly elevated at 27-28, Personally checked.  General: Sitting up in bed. NAD.  HEENT: Normal Neck: Supple. JVP to jaw. Carotids 2+ bilat; no bruits. No thyromegaly or nodule noted. Cor: PMI nondisplaced. RRR, No 2/6 TR. Lungs: CTAB, normal effort. Abdomen: Obese, non-tender, Markedly distended, tight. No HSM. No bruits or masses. +BS  Extremities: s/p bilateral  BKAs.   Neuro: Alert & orientedx3, cranial nerves grossly intact. moves all 4 extremities w/o difficulty. Affect flat but appropriate.   Telemetry   NSR 70s, Personally reviewed.   EKG   NSR 68 (04/10/17) QTc 504.  Labs    CBC  Recent Labs  04/16/17 0352 04/17/17 0409  WBC 5.9 5.5  HGB 11.9* 11.8*  HCT 40.0 39.8  MCV 104.7* 105.3*  PLT 74* 74*   Basic Metabolic Panel  Recent Labs  04/16/17 0352  04/17/17 0409 04/17/17 1420 04/17/17 2245  NA 142  --  141 142  --   K 2.5*  < > 2.4* 3.1* 2.6*  CL 93*  --  97* 96*  --   CO2 36*  --  35* 34*  --   GLUCOSE 129*  --  135* 192*  --   BUN 55*  --  52* 51*  --   CREATININE 2.31*  --  2.24* 2.30*  --   CALCIUM 9.3  --  9.1 9.3  --   MG 2.6*  --  2.6*  --   --   < > = values in this interval not displayed. Liver Function Tests No results for input(s): AST, ALT, ALKPHOS, BILITOT, PROT, ALBUMIN in the last 72  hours. No results for input(s): LIPASE, AMYLASE in the last 72 hours. Cardiac Enzymes No results for input(s): CKTOTAL, CKMB, CKMBINDEX, TROPONINI in the last 72 hours.  BNP: BNP (last 3 results)  Recent Labs  01/22/17 1723 04/06/17 1810 04/09/17 1129  BNP 474.8* 455.6* 459.2*    ProBNP (last 3 results) No results for input(s): PROBNP in the last 8760 hours.   D-Dimer No results for input(s): DDIMER in the last 72 hours. Hemoglobin A1C No results for input(s): HGBA1C in the last 72 hours. Fasting Lipid Panel No results for input(s): CHOL, HDL, LDLCALC, TRIG, CHOLHDL, LDLDIRECT in the last 72 hours. Thyroid Function Tests No results for input(s): TSH, T4TOTAL, T3FREE, THYROIDAB in the last 72 hours.  Invalid input(s): FREET3  Other results:  Imaging   No results found.  Medications:    Scheduled Medications: . acetaZOLAMIDE  500 mg Oral BID  . Chlorhexidine Gluconate Cloth  6 each Topical Daily  . insulin aspart  0-20 Units Subcutaneous Q4H  . insulin glargine  15 Units Subcutaneous Daily    . lactulose  60 g Oral TID  . mouth rinse  15 mL Mouth Rinse BID  . metolazone  5 mg Oral Daily  . potassium chloride  40 mEq Oral TID  . rifaximin  550 mg Oral BID  . sodium chloride flush  3 mL Intravenous Q12H    Infusions: . sodium chloride    . amiodarone 30 mg/hr (04/18/17 0700)  . furosemide Stopped (04/17/17 2327)   PRN Medications: sodium chloride, acetaminophen, cyclobenzaprine, Gerhardt's butt cream, LORazepam, morphine CONCENTRATE, ondansetron (ZOFRAN) IV, sodium chloride flush, sodium chloride flush  Patient Profile   Sue Martin is a 51 y.o. 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  Admitted with acute on chronic combined CHF with worsening SOB and edema. HF team asked to consult with initial mixed venous saturation of 52% on PICC line.   Assessment/Plan   1. A/C Combined Systolic/Diastolic Heart Failure - ECHO 09/979 EF 35%. Echo 04/11/17 EF 35% Grade III DD.  -Off milrinone with SVT -she has severe biventricular failure with R>>L HF.  - Diuresis has picked up the past two days. I/O complicated by incontinence.  - Continue lasix 160 q 8 hrs. Goal is to optimize as much as possible prior to discharge. - Continue metolazone 5 mg daily - Continue diamox 500 mg BID - BP too low for other agents. - Disposition plan now for [facility] on discharge.  2. 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 BMET pending this am.  - Continue to follow closely with diuresis.  3. A/C Hypoxic Respiratory Failure  O2 sats 90% on 5 liters oxygen daily.  No change.   - ABG with chronic CO2 retention with pCO2 in 60s 4. CAD- PCI LAD and RCA.  - No s/s of ischemia.     - Not on bb with low output hf. Continue asa and statin.  5. DMII - On sliding scale. Stable.  6. Cirrhosis with hepatic encephalopathy - Initial ammonia 81. 03/2017 developed severe encephalopathy. NGT  placed for lactulose and rifaxin. Stable on both.  - PLTs low due to splenic sequestration  7. 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 SVT - Load amio. Maintaining NSR.  - Keep K >4 and Mag > 2. Supp ordered.  10. Hypokalemia - Continue to aggressively supplement.  -  BMET pending this am with supp overnight.  - Will try and avoid oral K supp for now, or at least the large 20 meq tablets.   Plan for optimization, and then Hospice House on discharge.   Length of Stay: [address]  Luane School  04/18/2017, 7:20 AM  Advanced Heart Failure Team Pager [phone] (M-F; 7a - 4p)  Please contact [facility] for night-coverage after hours (4p -7a ) and weekends on [email or web address]  Patient seen and examined with the above-signed Advanced Practice Provider and/or Housestaff. I personally reviewed laboratory data, imaging studies and relevant notes. I independently examined the patient and formulated the important aspects of the plan. I have edited the note to reflect any of my changes or salient points. I have personally discussed the plan with the patient and/or family.  Discussions with Hospice noted. The plan is for her to discharge to a Hospice facility when ready due to MSOF and poor QOL. I discussed with Ms. Lelon Huh in Hospice and they would like to continue diuresis prior to d/c to get her as dry as possible prior to discharge to promote comfort. She is currently diuresing very well but CVP remains quite high. Hepatic encephalopathy improved with lactulose. K is low. Will supp.   Remains severely ill but improving slowly. Will continue current regimen with plans to go to hospice facility on d/c.   SVT is quiescent. Will change IV amio to 200 po bid,   Arvilla Meres, MD  8:24 AM

## 2017-04-18 NOTE — Progress Notes (Signed)
Daily Progress Note   Patient Name: Sue Martin       Date: 04/18/2017 DOB: 1966-04-17  Age: 51 y.o. MRN#: [id number] Attending Physician: Jolaine Artist, MD Primary Care Physician: Lucretia Kern, DO Admit Date: 04/06/2017  Reason for Consultation/Follow-up: Establishing goals of care and Psychosocial/spiritual support  Subjective: Discussed patient with RN.  Patient tearful yesterday after Palliative meeting as husband promised to spend time with the patient in the afternoon but family did not return until after 7 pm.  Met with patient and Dr. Haroldine Laws at bedside.  Patient is diuresing well she is reconsidering her options - which include hospice house vs continued aggressive care at [facility].   Family is unable to care for patient at home any longer.  Returned later in the day to continue discussions but patient was sleeping.   Assessment: 51 yo female with end stage heart failure, advanced cirrhosis with recurrent encephalopathy, chronic respiratory failure secondary to CHF and Cirrhosis,  bilateral amputation, legally blind - kidney function slowly decreasing (GFR 22).  Patient expressed her goals as (1) spending time with family and (2) her independence.  During our meeting 10/22 she was very proud of herself for independently choosing [facility].   Length of Stay: 12  Current Medications: Scheduled Meds:  . acetaZOLAMIDE  500 mg Oral BID  . amiodarone  200 mg Oral BID  . Chlorhexidine Gluconate Cloth  6 each Topical Daily  . insulin aspart  0-20 Units Subcutaneous Q4H  . insulin glargine  15 Units Subcutaneous Daily  . lactulose  40 g Oral TID  . mouth rinse  15 mL Mouth Rinse BID  . metolazone  5 mg Oral Daily  . rifaximin  550 mg Oral BID  . sodium chloride flush   3 mL Intravenous Q12H    Continuous Infusions: . sodium chloride    . furosemide Stopped (04/18/17 1413)    PRN Meds: sodium chloride, acetaminophen, cyclobenzaprine, Gerhardt's butt cream, LORazepam, morphine CONCENTRATE, ondansetron (ZOFRAN) IV, sodium chloride flush, sodium chloride flush  Physical Exam        Obese female, affect is somewhat child like.  NAD CV brady Resp not distress on 5L  Vital Signs: BP 98/63   Pulse (!) 58   Temp 97.7 F (36.5 C) (Oral)   Resp 14  Ht '4\' 7"'  (1.397 m)   Wt 106 kg (233 lb 11 oz)   LMP 12/22/2016   SpO2 97%   BMI 54.31 kg/m  SpO2: SpO2: 97 % O2 Device: O2 Device: Nasal Cannula O2 Flow Rate: O2 Flow Rate (L/min): 5 L/min  Intake/output summary:  Intake/Output Summary (Last 24 hours) at 04/18/17 1923 Last data filed at 04/18/17 1800  Gross per 24 hour  Intake          1566.41 ml  Output             1725 ml  Net          -158.59 ml   LBM: Last BM Date: 04/17/17 Baseline Weight: Weight: 117.9 kg (260 lb) Most recent weight: Weight: 106 kg (233 lb 11 oz)       Palliative Assessment/Data: 20%      Patient Active Problem List   Diagnosis Date Noted  . Encounter for hospice care discussion   . Pressure injury of skin 04/16/2017  . Palliative care encounter   . DNR (do not resuscitate) discussion   . Hepatic encephalopathy ([place])   . Acute renal failure with acute tubular necrosis superimposed on stage 4 chronic kidney disease ([place])   . Jaundice   . Elevated bilirubin   . CHF (congestive heart failure) ([place]) 04/06/2017  . Acute pulmonary edema (HCC)   . Chest pain 01/22/2017  . Acute on chronic respiratory failure with hypoxia ([place]) 01/22/2017  . Thrombocytopenia ([place]) 01/22/2017  . Cardiomyopathy, ischemic 06/13/2016  . Type II diabetes mellitus with ophthalmic manifestations, uncontrolled ([place]) 12/22/2014  . Hyperlipidemia 12/22/2014  . CAD (coronary artery disease) 12/12/2014  . Diabetic retinopathy ([place]) 12/12/2014   . PVD (peripheral vascular disease) ([place]) 12/12/2014  . CKD (chronic kidney disease) stage 3, GFR 30-59 ml/min (HCC) 12/12/2014  . Type 2 diabetes mellitus with diabetic nephropathy ([place]) 11/28/2014  . Acute on chronic systolic CHF (congestive heart failure) ([place]) 11/28/2014  . Cirrhosis ([place]) 11/28/2014  . Generalized OA 11/28/2014  . S/P bilateral BKA (below knee amputation) ([place]) 11/28/2014    Palliative Care Plan    Recommendations/Plan:  I'm very concerned that the patient is too fragile to do well outside of the hospital for very long.  We will work in combination with the HF team - reassessing each day in order to help refine [name].     Family is unable to care for her at home.  Dispo will be Hospice house vs SNF with Palliative care (not Hospice)  Goals of Care and Additional Recommendations:  Limitations on Scope of Treatment:  Code Status:  DNR  Prognosis: in the setting of end stage heart failure, advanced cirrhosis with recurrent encephalopathy, chronic respiratory failure secondary to CHF and Cirrhosis,  bilateral amputation, - kidney function slowly decreasing (GFR 22) - she has a prognosis of Days with full comfort care at hospice house or Weeks to months with continued aggressive care.   Discharge Planning:  To Be Determined  Hospice House vs SNF with Palliative   (Palliative would see her once every 6 weeks or so at [facility]).  Care plan was discussed with bedside RN, HF team, patient, left message for husband.  Thank you for allowing the Palliative Medicine Team to assist in the care of this patient.  Total time spent:  35 min.     Greater than 50%  of this time was spent counseling and coordinating care related to the above assessment and plan.  Florentina Jenny,  PA-C Palliative Medicine  Please contact Palliative MedicineTeam phone at [phone] for questions and concerns between 7 am - 7 pm.   Please see AMION for individual provider pager  numbers.

## 2017-04-18 NOTE — Progress Notes (Addendum)
Paged as well as left message for palliative care team for any further instructions. Patient very sensitive to pain medications and is a RASS of -2. Vitals are stable, monitoring patient closely. Have not received a response at this time.    1850 Norvel RichardsMarianne Dellinger PA for palliative care returned call. No new orders at this time, just continue to monitor closely. Vitals remain stable at this time.

## 2017-04-19 LAB — COOXEMETRY PANEL
CARBOXYHEMOGLOBIN: 1.8 % — AB (ref 0.5–1.5)
METHEMOGLOBIN: 1.3 % (ref 0.0–1.5)
O2 SAT: 75.3 %
TOTAL HEMOGLOBIN: 11.5 g/dL — AB (ref 12.0–16.0)

## 2017-04-19 LAB — GLUCOSE, CAPILLARY
GLUCOSE-CAPILLARY: 71 mg/dL (ref 65–99)
Glucose-Capillary: 81 mg/dL (ref 65–99)
Glucose-Capillary: 83 mg/dL (ref 65–99)

## 2017-04-19 LAB — CBC
HCT: 39.6 % (ref 36.0–46.0)
Hemoglobin: 11.4 g/dL — ABNORMAL LOW (ref 12.0–15.0)
MCH: 31.1 pg (ref 26.0–34.0)
MCHC: 28.8 g/dL — AB (ref 30.0–36.0)
MCV: 107.9 fL — ABNORMAL HIGH (ref 78.0–100.0)
PLATELETS: 62 10*3/uL — AB (ref 150–400)
RBC: 3.67 MIL/uL — ABNORMAL LOW (ref 3.87–5.11)
RDW: 16.8 % — AB (ref 11.5–15.5)
WBC: 6.2 10*3/uL (ref 4.0–10.5)

## 2017-04-19 LAB — AMMONIA: AMMONIA: 70 umol/L — AB (ref 9–35)

## 2017-04-19 LAB — BASIC METABOLIC PANEL
ANION GAP: 12 (ref 5–15)
BUN: 57 mg/dL — AB (ref 6–20)
CALCIUM: 9.1 mg/dL (ref 8.9–10.3)
CO2: 34 mmol/L — AB (ref 22–32)
CREATININE: 2.76 mg/dL — AB (ref 0.44–1.00)
Chloride: 95 mmol/L — ABNORMAL LOW (ref 101–111)
GFR calc Af Amer: 22 mL/min — ABNORMAL LOW (ref >60)
GFR calc non Af Amer: 19 mL/min — ABNORMAL LOW (ref >60)
GLUCOSE: 98 mg/dL (ref 65–99)
Potassium: 3.4 mmol/L — ABNORMAL LOW (ref 3.5–5.1)
Sodium: 141 mmol/L (ref 135–145)

## 2017-04-19 LAB — MAGNESIUM: Magnesium: 2.5 mg/dL — ABNORMAL HIGH (ref 1.7–2.4)

## 2017-04-19 MED ORDER — MORPHINE SULFATE (CONCENTRATE) 10 MG/0.5ML PO SOLN: 2.5000 mg | ORAL | Status: DC | PRN

## 2017-04-19 MED ORDER — TORSEMIDE 20 MG PO TABS: 100.0000 mg | ORAL_TABLET | Freq: Every day | ORAL | 5 refills | Status: AC

## 2017-04-19 NOTE — Progress Notes (Signed)
Pts UOP since 2300 on 10/23 has been around 5-10cc/hour. Dr. Shirlee LatchMcLean made aware. No new orders given.

## 2017-04-19 NOTE — Progress Notes (Signed)
[facility] Anticipated discharge date: 10/24 Family notified: at bedside Transportation by [facility]- called at 1:50pm  CSW signing off.  Burna SisJenna H. Zaraya Delauder, LCSW Clinical Social Worker [phone]

## 2017-04-19 NOTE — Consult Note (Signed)
   [facility] Consult   04/19/2017  Sue Martin 04-22-1966 [id number]   Follow up:  Chart review reveals the [facility] is [facility].  Will sign off for follow up.  For questions, please contact:  Charlesetta ShanksVictoria Marvel Mcphillips, RN BSN CCM [facility] Liaison  [phone] business mobile phone Toll free office [phone]

## 2017-04-19 NOTE — Progress Notes (Signed)
Report [facility] received report.

## 2017-04-19 NOTE — Progress Notes (Signed)
[facility] Liaison:  RN visit  Received request from [place], [place], for family interest in [place] with request for transfer today.  Chart reviewed.  Met with patient and family to confirm interest and explain services.  Family agreeable to transfer today.  Eliezer Lofts, CSW, aware.  Registration paperwork completed today at 1239pm.  Dr. Orpah Melter to assume care per family request.  Please fax discharge summary to [phone].  RN, please call report to [phone].  Please arrange transport for patient to arrive as soon as possible.    Thank you for this referral.  Edyth Gunnels, RN, [facility] Liaison [phone]  All hospital liaisons are now on [place].

## 2017-04-19 NOTE — Progress Notes (Signed)
Patient very groggy initially awoke confused, but reoriented. Only tolerating  A small amount of lactulose, started feeling nauseous. Resting after medications, very little urine output.

## 2017-04-19 NOTE — Progress Notes (Addendum)
Daily Progress Note   Patient Name: Sue Martin       Date: 04/19/2017 DOB: 01-Oct-1965  Age: 51 y.o. MRN#: [id number] Attending Physician: Dolores Patty, MD Primary Care Physician: Terressa Koyanagi, DO Admit Date: 04/06/2017  Reason for Consultation/Follow-up: Establishing goals of care and Psychosocial/spiritual support  Subjective: Patient having trouble speaking her thoughts this morning.  Reports some pain but can not specify where.    Left voice mail for husband to call me.  Assessment:  51 yo female with end stage heart failure, advanced cirrhosis with recurrent encephalopathy, chronic respiratory failure secondary to CHF and Cirrhosis,  bilateral amputation, legally blind - kidney function slowly decreasing (GFR 19).  Ammonia 70 today.   Length of Stay: 13  Current Medications: Scheduled Meds:  . acetaZOLAMIDE  500 mg Oral BID  . amiodarone  200 mg Oral BID  . Chlorhexidine Gluconate Cloth  6 each Topical Daily  . insulin aspart  0-20 Units Subcutaneous Q4H  . insulin glargine  15 Units Subcutaneous Daily  . lactulose  40 g Oral TID  . mouth rinse  15 mL Mouth Rinse BID  . metolazone  5 mg Oral Daily  . rifaximin  550 mg Oral BID  . sodium chloride flush  3 mL Intravenous Q12H    Continuous Infusions: . sodium chloride    . furosemide Stopped (04/18/17 2302)    PRN Meds: sodium chloride, acetaminophen, cyclobenzaprine, Gerhardt's butt cream, LORazepam, morphine CONCENTRATE, ondansetron (ZOFRAN) IV, sodium chloride flush, sodium chloride flush  Physical Exam       Obese chronically ill female.  Child like affect.  Awake, confused.  Unable to lift hands to examine for asterixis CV brady decreased heart sounds Resp no distress on 5L Abdomen soft, nt, nd Ext  with 1-2 swelling, bilateral lower ext amputation.  Vital Signs: BP 106/67   Pulse (!) 58   Temp (!) 97.5 F (36.4 C) (Oral)   Resp 16   Ht 4\' 7"  (1.397 m)   Wt 109.6 kg (241 lb 10 oz)   LMP 12/22/2016   SpO2 100%   BMI 56.16 kg/m  SpO2: SpO2: 100 % O2 Device: O2 Device: Nasal Cannula O2 Flow Rate: O2 Flow Rate (L/min): 5 L/min  Intake/output summary:  Intake/Output Summary (Last 24 hours) at 04/19/17 0754 Last data  filed at 04/19/17 0600  Gross per 24 hour  Intake              666 ml  Output              660 ml  Net                6 ml   LBM: Last BM Date: 04/18/17 Baseline Weight: Weight: 117.9 kg (260 lb) Most recent weight: Weight: 109.6 kg (241 lb 10 oz)       Palliative Assessment/Data: 20%      Patient Active Problem List   Diagnosis Date Noted  . Encounter for hospice care discussion   . Pressure injury of skin 04/16/2017  . Palliative care encounter   . DNR (do not resuscitate) discussion   . Hepatic encephalopathy (HCC)   . Acute renal failure with acute tubular necrosis superimposed on stage 4 chronic kidney disease (HCC)   . Jaundice   . Elevated bilirubin   . CHF (congestive heart failure) (HCC) 04/06/2017  . Acute pulmonary edema (HCC)   . Chest pain 01/22/2017  . Acute on chronic respiratory failure with hypoxia (HCC) 01/22/2017  . Thrombocytopenia (HCC) 01/22/2017  . Cardiomyopathy, ischemic 06/13/2016  . Type II diabetes mellitus with ophthalmic manifestations, uncontrolled (HCC) 12/22/2014  . Hyperlipidemia 12/22/2014  . CAD (coronary artery disease) 12/12/2014  . Diabetic retinopathy (HCC) 12/12/2014  . PVD (peripheral vascular disease) (HCC) 12/12/2014  . CKD (chronic kidney disease) stage 3, GFR 30-59 ml/min (HCC) 12/12/2014  . Type 2 diabetes mellitus with diabetic nephropathy (HCC) 11/28/2014  . Acute on chronic systolic CHF (congestive heart failure) (HCC) 11/28/2014  . Cirrhosis (HCC) 11/28/2014  . Generalized OA 11/28/2014  . S/P  bilateral BKA (below knee amputation) (HCC) 11/28/2014    Palliative Care Plan    Recommendations/Plan:  Continue oral lactulose for encephalopathy.    Would attempt to use alternate methods to relieve mild pain (tylenol, distraction, massage, repositioning).  Morphine only for severe pain or dyspnea.  Will attempt to speak further with HCPOA husband Nedra HaiLee.    PMT will continue to follow.   I anticipate the patient's body will declare itself in the next few days and determine disposition.  Code Status:  DNR  Prognosis:  Days if full comfort.   Potentially days to weeks if aggressive care is continued.  Discharge Planning:  To Be Determined  Anticipate Hospice House.  Will revisit with HF team/patient/family when closer to DC.  Care plan was discussed with bedside RN  Thank you for allowing the Palliative Medicine Team to assist in the care of this patient.  Total time spent:  15 min.     Greater than 50%  of this time was spent counseling and coordinating care related to the above assessment and plan.  Norvel RichardsMarianne Kenedie Dirocco, PA-C Palliative Medicine  Please contact Palliative MedicineTeam phone at [phone] for questions and concerns between 7 am - 7 pm.   Please see AMION for individual provider pager numbers.

## 2017-04-19 NOTE — Progress Notes (Signed)
Discussed plan of care with CSW. [facility]. Family would like to move forward with plan for residential hospice. Discussed this with patient and she is agreeable; however, understanding is limited.   Gerlean RenShae Lee Shaffer, DNP, AGNP-C Palliative Medicine Team Team Phone # [phone]

## 2017-04-19 NOTE — Progress Notes (Signed)
Family at bedside discussed plan of care with hospice. [facility]. [facility].

## 2017-04-19 NOTE — Discharge Summary (Signed)
Advanced Heart Failure Team  Discharge Summary   Patient ID: Sue Martin MRN: [id number], DOB/AGE: 1966/04/19 51 y.o. Admit date: 04/06/2017 D/C date:     04/19/2017   Primary Discharge Diagnoses:  1. A/C Combined Systolic/Diastolic Heart Failure  2. AKI on CKD Stage III/IV 3. A/C Hypoxic Respiratory Failure 4. CAD- H/O PCI LAD and RCA 5. DMII 6. Cirrhosis with hepatic encephalopathy 7. PAD Bilateral BKA 8. SVT/A Flutter RVR 9. Hypokalemia 10. DNR/DNI   Hospital Course:  Sue Martin a 51 y.o.femalehistory of chronic combined CHF due to ICM, CAD s/p RCA and LAD PCI, HTN, DM2, CKD II, morbid obesity, and PAD s/p bilateral BKA.   Hospitalization was complicated and prolonged by multisystem organ failure.   Admitted with acute on chronic systolic/diastolic heart failure and acute/chronic hypoxic respiratory failure in the setting marked volume overload. Despite hight dose diuretics we were unable to diurese much and CVP remained greater than 20. Milrinone was added to agument diuresis however she developed SVT/ A Flutter so this was stopped. Amiodarone was loaded with restoration of NSR.   She later developed developed AMS with an ammonia 104. Started on lactulose with some improvement but continued to have intermittent episodes of AMS. Renal function was followed closely and continued to worsen with creatinine up to 2.76.   Palliative Care consulted for GOC. At the conclusion she was DNR/DNI with plans for Hospice. She will be discharged today [facility] with the PICC line in place. I personally discussed her care with Dr Barbee Shropshire and they will implement hospice medications once she arrives. We will continue oxygen 5 liters nasal cannula and PICC line.   Discharge Weight : 241 pounds.  Discharge Vitals: Blood pressure 93/61, pulse (!) 55, temperature (!) 96.1 F (35.6 C), temperature source Axillary, resp. rate 11, height 4\' 7"  (1.397 m), weight 241 lb 10 oz (109.6 kg), last  menstrual period 12/22/2016, SpO2 100 %.  Labs: Lab Results  Component Value Date   WBC 6.2 04/19/2017   HGB 11.4 (L) 04/19/2017   HCT 39.6 04/19/2017   MCV 107.9 (H) 04/19/2017   PLT 62 (L) 04/19/2017    Recent Labs Lab 04/19/17 0207  NA 141  K 3.4*  CL 95*  CO2 34*  BUN 57*  CREATININE 2.76*  CALCIUM 9.1  GLUCOSE 98   Lab Results  Component Value Date   CHOL 135 01/23/2017   HDL 26 (L) 01/23/2017   LDLCALC 92 01/23/2017   TRIG 87 01/23/2017   BNP (last 3 results)  Recent Labs  01/22/17 1723 04/06/17 1810 04/09/17 1129  BNP 474.8* 455.6* 459.2*    ProBNP (last 3 results) No results for input(s): PROBNP in the last 8760 hours.   Diagnostic Studies/Procedures   No results found.  Discharge Medications   Allergies as of 04/19/2017      Reactions   Broccoli [brassica Oleracea Italica] Anaphylaxis   Influenza Vaccines Anaphylaxis   Per patient   Pneumococcal Vaccines Anaphylaxis   Per patient   Trulicity [dulaglutide] Hives, Nausea And Vomiting   Amoxicillin Nausea And Vomiting   Doxycycline Hives, Nausea And Vomiting   Iron Other (See Comments)   GI intolerance   Latex Other (See Comments)   blisters   Lisinopril Nausea Only   Penicillins Other (See Comments)   GI intolerance, UTI, can tolerate in IV Tolerates cephalosporins Has patient had a PCN reaction causing immediate rash, facial/tongue/throat swelling, SOB or lightheadedness with hypotension: No Has patient had a PCN reaction causing severe  rash involving mucus membranes or skin necrosis: No Has patient had a PCN reaction that required hospitalization: No Has patient had a PCN reaction occurring within the last 10 years: No If all of the above answers are "NO", then may proceed with Cephalosporin    Tape Other (See Comments)   Tears skin.  Please use "paper" tape   Codeine Nausea And Vomiting, Rash   Sulfa Antibiotics Nausea And Vomiting, Rash   Sulfur Nausea And Vomiting, Rash       Medication List    STOP taking these medications   aspirin 325 MG tablet   atorvastatin 20 MG tablet Commonly known as:  LIPITOR   hydrALAZINE 10 MG tablet Commonly known as:  APRESOLINE   insulin aspart 100 UNIT/ML FlexPen Commonly known as:  NOVOLOG   insulin degludec 100 UNIT/ML Sopn FlexTouch Pen Commonly known as:  TRESIBA FLEXTOUCH   isosorbide mononitrate 30 MG 24 hr tablet Commonly known as:  IMDUR   metoCLOPramide 5 MG tablet Commonly known as:  REGLAN   metolazone 5 MG tablet Commonly known as:  ZAROXOLYN   metoprolol succinate 25 MG 24 hr tablet Commonly known as:  TOPROL-XL   metoprolol tartrate 25 MG tablet Commonly known as:  LOPRESSOR     TAKE these medications   cyclobenzaprine 5 MG tablet Commonly known as:  FLEXERIL Take 1 tablet (5 mg total) by mouth 2 (two) times daily as needed for muscle spasms.   OXYGEN 2 L by Continuous infusion (non-IV) route daily.   potassium chloride 20 MEQ/15ML (10%) Soln Take 30 mEq by mouth 2 (two) times daily.   torsemide 20 MG tablet Commonly known as:  DEMADEX Take 5 tablets (100 mg total) by mouth daily. What changed:  how much to take  when to take this       Disposition   The patient will be discharged in stable condition to home. Discharge Instructions    Diet - low sodium heart healthy    Complete by:  As directed    Increase activity slowly    Complete by:  As directed          Duration of Discharge Encounter: Greater than 35 minutes   Signed, Sue Clegg NP-C  04/19/2017, 10:40 AM   Patient seen and examined with Tonye BecketAmy Clegg, NP. We discussed all aspects of the encounter. I agree with the assessment and plan as stated above.   She has deteriorated overnight. Renal function worse. CVP remains markedly elevated. Weight going back up. Now much more lethargic. With refractory MSOF agree with Hospice as really only option. [facility].   Arvilla MeresBensimhon, Sue Switala, MD   4:38 PM

## 2017-04-19 NOTE — Progress Notes (Signed)
Discharged via Doctor, general practicestretcher with [facility]. Sacral dressing off, 3 areas of purplish skin near coccyx. Right PICC line dressing changed using sterile technique. Report given to transport. Family collected all valuables, including cell  phone, chargers, stuffed bear, and other clothes belongings.

## 2017-04-19 NOTE — Progress Notes (Signed)
Advanced Heart Failure Rounding Note  Primary Cardiologist: Dr Wynonia Hazard  Subjective:    Admitted with acute on chronic combined CHF with worsening SOB and edema. HF team asked to consult with initial mixed venous saturation of 52% on PICC line.   10/18 A Flutter/ SVT- started on amio.   Yesterday she received dose of morphine. Over night she was hard to wake up but his morning more awake. Missed dose of lactulose. Ammonia up 70.   Denies SOB/pain.       Objective:   Weight Range: 241 lb 10 oz (109.6 kg) Body mass index is 56.16 kg/m.   Vital Signs:   Temp:  [97.5 F (36.4 C)-98.4 F (36.9 C)] 97.5 F (36.4 C) (10/24 0402) Pulse Rate:  [44-68] 58 (10/24 0700) Resp:  [10-24] 16 (10/24 0700) BP: (93-123)/(54-78) 106/67 (10/24 0700) SpO2:  [84 %-100 %] 100 % (10/24 0700) Weight:  [241 lb 10 oz (109.6 kg)] 241 lb 10 oz (109.6 kg) (10/24 0500) Last BM Date: 04/18/17  Weight change: Filed Weights   04/17/17 0400 04/18/17 0500 04/19/17 0500  Weight: 238 lb 8.6 oz (108.2 kg) 233 lb 11 oz (106 kg) 241 lb 10 oz (109.6 kg)   Intake/Output:   Intake/Output Summary (Last 24 hours) at 04/19/17 0746 Last data filed at 04/19/17 0600  Gross per 24 hour  Intake              666 ml  Output              660 ml  Net                6 ml    Physical Exam   CVP 22 personally checked  General:  Appears chronically ill. No resp difficulty HEENT: normal Neck: supple. JVP to jaw. . Carotids 2+ bilat; no bruits. No lymphadenopathy or thryomegaly appreciated. Cor: PMI nondisplaced. Regular rate & rhythm. No rubs, gallops or murmurs. Lungs: clear Abdomen: soft, nontender, nondistended. No hepatosplenomegaly. No bruits or masses. Good bowel sounds. Extremities: no cyanosis, clubbing, rash, R and LLE bka. RUE PICC  Neuro: alert & orientedx2, cranial nerves grossly intact. moves all 4 extremities w/o difficulty. Affect flat     Telemetry   NSR 60s. Personally reviewed.   EKG    NSR 68 (04/10/17) QTc 504.  Labs    CBC  Recent Labs  04/18/17 0645 04/19/17 0207  WBC 7.1 6.2  HGB 11.6* 11.4*  HCT 40.0 39.6  MCV 105.8* 107.9*  PLT 75* 62*   Basic Metabolic Panel  Recent Labs  04/18/17 0645 04/18/17 1415 04/19/17 0207  NA 142 142 141  K 3.1* 3.7 3.4*  CL 96* 97* 95*  CO2 33* 33* 34*  GLUCOSE 106* 115* 98  BUN 52* 53* 57*  CREATININE 2.30* 2.44* 2.76*  CALCIUM 9.3 9.1 9.1  MG 2.5*  --  2.5*   Liver Function Tests No results for input(s): AST, ALT, ALKPHOS, BILITOT, PROT, ALBUMIN in the last 72 hours. No results for input(s): LIPASE, AMYLASE in the last 72 hours. Cardiac Enzymes No results for input(s): CKTOTAL, CKMB, CKMBINDEX, TROPONINI in the last 72 hours.  BNP: BNP (last 3 results)  Recent Labs  01/22/17 1723 04/06/17 1810 04/09/17 1129  BNP 474.8* 455.6* 459.2*    ProBNP (last 3 results) No results for input(s): PROBNP in the last 8760 hours.   D-Dimer No results for input(s): DDIMER in the last 72 hours. Hemoglobin A1C No results for input(s):  HGBA1C in the last 72 hours. Fasting Lipid Panel No results for input(s): CHOL, HDL, LDLCALC, TRIG, CHOLHDL, LDLDIRECT in the last 72 hours. Thyroid Function Tests No results for input(s): TSH, T4TOTAL, T3FREE, THYROIDAB in the last 72 hours.  Invalid input(s): FREET3  Other results:  Imaging   No results found.  Medications:    Scheduled Medications: . acetaZOLAMIDE  500 mg Oral BID  . amiodarone  200 mg Oral BID  . Chlorhexidine Gluconate Cloth  6 each Topical Daily  . insulin aspart  0-20 Units Subcutaneous Q4H  . insulin glargine  15 Units Subcutaneous Daily  . lactulose  40 g Oral TID  . mouth rinse  15 mL Mouth Rinse BID  . metolazone  5 mg Oral Daily  . rifaximin  550 mg Oral BID  . sodium chloride flush  3 mL Intravenous Q12H    Infusions: . sodium chloride    . furosemide Stopped (04/18/17 2302)   PRN Medications: sodium chloride, acetaminophen,  cyclobenzaprine, Gerhardt's butt cream, LORazepam, morphine CONCENTRATE, ondansetron (ZOFRAN) IV, sodium chloride flush, sodium chloride flush  Patient Profile   Sue Martin is a 51 y.o. 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  Admitted with acute on chronic combined CHF with worsening SOB and edema. HF team asked to consult with initial mixed venous saturation of 52% on PICC line.   Assessment/Plan   1. A/C Combined Systolic/Diastolic Heart Failure - ECHO 1/61097/2018 EF 35%. Echo 04/11/17 EF 35% Grade III DD.  -Off milrinone with SVT -she has severe biventricular failure with R>>L HF.  - Sluggish urine output. CVP > 20.  - Continue lasix 160 q 8 hrs. Goal is to optimize as much as possible prior to discharge. - Continue metolazone 5 mg daily - Continue diamox 500 mg BID  2. 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 Creatinine up to 2.76.   3. A/C Hypoxic Respiratory Failure  O2 sats 90% on 5 liters oxygen daily.  No change.   -Sats stable.  4. CAD- PCI LAD and RCA.  No S/S ischemia.      - Not on bb with low output hf. Continue asa and statin.  5. DMII - On sliding scale. Stable.  6. Cirrhosis with hepatic encephalopathy - Initial ammonia 81. 03/2017 developed severe encephalopathy.  -Missed dose of lactulose. Ammonia up to 70 - PLTs low due to splenic sequestration  7. 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 SVT - Load amio. Maintaining NSR.  - Keep K >4 and Mag > 2. Supp ordered.  10. Hypokalemia 11. DNR/DNI   We not making any progress despite high dose diuresis. CVP has been in the 20s for days. Renal function is getting worse. Ammonia up 70.  Anticipate moving to [facility] today. Plan for hospice when discharged to SNF [facility].    Length of Stay: 13  Tonye BecketAmy Clegg, NP  04/19/2017, 7:46 AM  Advanced Heart Failure Team Pager [phone] (M-F; 7a -  4p)  Please contact [facility] for night-coverage after hours (4p -7a ) and weekends on [email or web address]  Patient seen and examined with Tonye BecketAmy Clegg, NP. We discussed all aspects of the encounter. I agree with the assessment and plan as stated above.  \ She has deteriorated overnight. Renal function worse. CVP remains markedly elevated. Weight going back up. Now much more lethargic. With refractory  MSOF agree wit Hospice is best option. Will plan transfer to [facility] today.   Arvilla Meres, MD  10:28 AM

## 2017-04-27 DEATH — deceased

## 2017-04-28 ENCOUNTER — Ambulatory Visit: Payer: Medicare HMO | Admitting: Gastroenterology

## 2017-05-02 ENCOUNTER — Ambulatory Visit: Payer: Medicare HMO | Admitting: Endocrinology

## 2017-05-02 NOTE — Progress Notes (Deleted)
Patient ID: Sue Martin, female   DOB: 1966/02/22, 51 y.o.   MRN: [id number]           Reason for Appointment: Follow-up for Type 2 Diabetes   History of Present Illness:          Date of diagnosis of type 2 diabetes mellitus: ?  2007        Recent history:   INSULIN regimen is described as:  Lantus 50 units daily in the morning, Novolog 35 units with meals       A1c in 11/17 was 10.1 and it is now staying around 8+ %   Current blood sugar patterns and problems identified:  Although her blood sugars were better with [place] compared to Lantus and she was taking only 30 units she went back to Lantus on her own  She says that she was having vomiting spells and she thought it was related to [place] and did not notify us  She is checking her blood sugars at various times but not clear which readings are after meals  Her fasting blood sugars do not appear to be consistently high except once with eating pizza night before  Blood sugars later in the day are quite variable but occasionally relatively high readings in the early afternoon  She does not adjust her NOVOLOG based on what she is eating and is taking the same dose regardless with variable results  She said that she is having difficulty with eating sometimes and may not finish meals  Also she said that she has difficulty getting a blood sample from her fingers for testing and also this is painful  HYPOGLYCEMIA has been occurring only twice on her monitor but she previously has had a symptomatic low sugars also  Her diet is variable with sometimes not balanced meal  Side effects with medications: vomiting with Trulicity  Compliance with the medical regimen: Fair  Hypoglycemia: None  Glucose monitoring:  done occasionally        Glucometer: Accu-Chek .       Blood Glucose readings by: Download    Mean values apply above for all meters except median for One Touch  PRE-MEAL Fasting Lunch Dinner Bedtime Overall    Glucose range: 59-213   1 54-245  57-108  70- 98    Mean/median:     127+/-66      Self-care:  Meal times: Breakfast: 10 am Lunch: 3-4 pm,    Dinner at 8 pm  Typical meal intake: Breakfast is usually a granola bar or light yogurt, lunch may be sandwich or just light snack and soups, dinner is usually yogurt or other snacks like fruit or sugar-free cookies                Dietician visit, most recent: none, only in the hospital               Exercise:  unable to do any  Weight history: Around the time of diagnosis her weight was 350  Wt Readings from Last 3 Encounters:  04/19/17 241 lb 10 oz (109.6 kg)  01/30/17 242 lb 1.6 oz (109.8 kg)  07/07/16 250 lb (113.4 kg)    Glycemic control:   Lab Results  Component Value Date   HGBA1C 8.4 (H) 01/23/2017   HGBA1C 8.7 (H) 09/12/2016   HGBA1C 10.1 05/02/2016   Lab Results  Component Value Date   MICROALBUR 4.8 (H) 10/28/2016   LDLCALC 92 01/23/2017   CREATININE 2.76 (H) 04/19/2017  Other active problems: See review of systems   Allergies as of 05/02/2017      Reactions   Broccoli [brassica Oleracea Italica] Anaphylaxis   Influenza Vaccines Anaphylaxis   Per patient   Pneumococcal Vaccines Anaphylaxis   Per patient   Trulicity [dulaglutide] Hives, Nausea And Vomiting   Amoxicillin Nausea And Vomiting   Doxycycline Hives, Nausea And Vomiting   Iron Other (See Comments)   GI intolerance   Latex Other (See Comments)   blisters   Lisinopril Nausea Only   Penicillins Other (See Comments)   GI intolerance, UTI, can tolerate in IV Tolerates cephalosporins Has patient had a PCN reaction causing immediate rash, facial/tongue/throat swelling, SOB or lightheadedness with hypotension: No Has patient had a PCN reaction causing severe rash involving mucus membranes or skin necrosis: No Has patient had a PCN reaction that required hospitalization: No Has patient had a PCN reaction occurring within the last 10 years: No If all  of the above answers are "NO", then may proceed with Cephalosporin    Tape Other (See Comments)   Tears skin.  Please use "paper" tape   Codeine Nausea And Vomiting, Rash   Sulfa Antibiotics Nausea And Vomiting, Rash   Sulfur Nausea And Vomiting, Rash      Medication List        Accurate as of 05/02/17  1:18 PM. Always use your most recent med list.          cyclobenzaprine 5 MG tablet Commonly known as:  FLEXERIL Take 1 tablet (5 mg total) by mouth 2 (two) times daily as needed for muscle spasms.   OXYGEN 2 L by Continuous infusion (non-IV) route daily.   potassium chloride 20 MEQ/15ML (10%) Soln Take 30 mEq by mouth 2 (two) times daily.   torsemide 20 MG tablet Commonly known as:  DEMADEX Take 5 tablets (100 mg total) by mouth daily.       Allergies:  Allergies  Allergen Reactions  . Broccoli [Brassica Oleracea Italica] Anaphylaxis  . Influenza Vaccines Anaphylaxis    Per patient  . Pneumococcal Vaccines Anaphylaxis    Per patient  . Trulicity [Dulaglutide] Hives and Nausea And Vomiting  . Amoxicillin Nausea And Vomiting  . Doxycycline Hives and Nausea And Vomiting  . Iron Other (See Comments)    GI intolerance  . Latex Other (See Comments)    blisters  . Lisinopril Nausea Only  . Penicillins Other (See Comments)    GI intolerance, UTI, can tolerate in IV Tolerates cephalosporins Has patient had a PCN reaction causing immediate rash, facial/tongue/throat swelling, SOB or lightheadedness with hypotension: No Has patient had a PCN reaction causing severe rash involving mucus membranes or skin necrosis: No Has patient had a PCN reaction that required hospitalization: No Has patient had a PCN reaction occurring within the last 10 years: No If all of the above answers are "NO", then may proceed with Cephalosporin   . Tape Other (See Comments)    Tears skin.  Please use "paper" tape  . Codeine Nausea And Vomiting and Rash  . Sulfa Antibiotics Nausea And Vomiting  and Rash  . Sulfur Nausea And Vomiting and Rash    Past Medical History:  Diagnosis Date  . Anxiety   . Arthritis    "throughout my body" (04/06/2017)  . Asthma   . Bipolar disorder (HCC)   . CAD (coronary artery disease) 12/12/2014   -s/p 2V PCI of the LAD/RCA with taxus stents -cardiologist is Dr. Carmon Sails, [facility]   .  CHF (congestive heart failure) (HCC)    sees Dr. Wynonia HazardKhawaja  . Chronic back pain    "all over"  . Cirrhosis of liver (HCC)    Hattie Perch/notes 04/06/2017  . CKD (chronic kidney disease), stage III (HCC)    Hattie Perch/notes 04/06/2017  . Depression   . Diabetes mellitus with renal complications (HCC)    PVD and retinopathy, S/p bilat ambutations  . Fibromyalgia   . Glaucoma, bilateral   . Headache    "severely, 2/wk; maybe 2 not as severe" (04/06/2017)  . Heart murmur   . High cholesterol   . History of blood transfusion 2013   "when I had my legs done"  . Liver cirrhosis (HCC)   . Lyme disease   . On home oxygen therapy    "2L; 24/7" (04/06/2017)  . Retinal detachment    sees Dr. Johna SheriffPeter Rogaski per her report    Past Surgical History:  Procedure Laterality Date  . CORONARY ANGIOPLASTY WITH STENT PLACEMENT     "I have 2-3 stents" (04/06/2017)  . LASIK Bilateral   . LEG AMPUTATION BELOW KNEE Bilateral 09/09/2011- 09/11/2011  . RETINAL DETACHMENT SURGERY Right    "tried to fix it; it didn't take"  . TONSILLECTOMY    . TUBAL LIGATION      Family History  Problem Relation Age of Onset  . Arthritis Mother   . Heart disease Mother   . Mental illness Mother   . Diabetes Mother   . Arthritis Maternal Grandmother   . Diabetes Maternal Grandmother   . Arthritis Father   . CVA Father   . Hypertension Father   . Sudden death Paternal Grandfather     Social History:  reports that  has never smoked. she has never used smokeless tobacco. She reports that she drinks alcohol. She reports that she does not use drugs.    Review of Systems    Lipid history: On Lipitor for the  last few years, taking 20 mg, Not clear if this is being followed by PCP or cardiologist   Lab Results  Component Value Date   CHOL 135 01/23/2017   HDL 26 (L) 01/23/2017   LDLCALC 92 01/23/2017   TRIG 87 01/23/2017   CHOLHDL 5.2 01/23/2017           Eyes:  history of significant Visual loss  since about 2005.    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  She has bilateral below-knee amputations   History of cirrhosis: Seen by gastroenterologist  She has history of variable early satiety, postprandial nausea or vomiting and intermittent episodes of vomiting, has not been evaluated for this before or treated   Physical Examination:  There were no vitals taken for this visit.    ASSESSMENT:  Diabetes type 2, uncontrolled with obesity See history of present illness for detailed discussion of current diabetes management, blood sugar patterns and problems identified  She has persistently poor control of her diabetes with A1c usually over 8%  Recently not able to assess her level of control because of frequent glucose monitoring and no fructosamine level being available Her diet is quite variable and discussed above and blood sugar responses depending on her portions, carbohydrate and fat intake She is not adjusting her mealtime doses based on what she is eating and this was discussed Also if she has gastroparesis this may affect her postprandial  blood sugars to variable extent  Discussed that previously she had much more effective control with [place] compared to Lantus and reassured her that this does not cause vomiting She was able to take less [place] with more even control.  VOMITING episodes, postprandial fullness and periodic nausea: Likely to be gastroparesis  PLAN:   She was advised to try checking her blood sugar from the forearm for  convenience and comfort  She needs to check blood sugars at least twice a day at various times including after meals  Discussed blood sugar targets at various times; also discussed that she will only be able to get the freestyle Libre if she is documenting blood sugar testing 4 times a day has required by Medicare  [facility] starting with 40 units but may need to reduce it at least 5 units if fasting readings are getting lower as before  She may take her NovoLog right at meal times and adjust the dose between 25 and 35 based on what she is eating and her appetite  Trial of REGLAN for probable gastroparesis at least at dinnertime and then 3 times a day tolerated and benefiting from this  Follow-up in 1 month again  There are no Patient Instructions on file for this visit.      Rexanna Louthan 05/02/2017, 1:18 PM   Note: This office note was prepared with Dragon voice recognition system technology. Any transcriptional errors that result from this process are unintentional.

## 2017-05-11 ENCOUNTER — Ambulatory Visit: Payer: Medicare HMO | Admitting: Family Medicine

## 2017-05-19 ENCOUNTER — Ambulatory Visit: Payer: Medicare HMO

## 2017-05-19 ENCOUNTER — Telehealth: Payer: Self-pay | Admitting: Pharmacist

## 2017-05-19 NOTE — Patient Outreach (Signed)
[facility] Care Management  05/19/2017  Sue Martin [birth year] [id number]  51 year old [facility] was recently hospitalized 04/06/2017- 04/19/2017 and [facility]  All medications have been reviewed electronically.  Telephone call to review medications [facility].   Noted several medications discontinued (aspirin 325mg , atorvastatin, hydralazine, insulin, isosorbide, metoclopramide, metolazine, metoprolol).    Drugs sorted by system:  Cardiovascular: torsemide  Pain: cyclobenzaprine  Vitamins/Minerals: potassium  Sue Martin, PharmD, [facility] Clinical Pharmacist [facility] [phone]

## 2017-05-27 IMAGING — DX DG CHEST 2V
2 series · 3 of 3 positions shown · non-contrast
Comparison: None.

CLINICAL DATA: Shortness of breath x 2 days. Sates her grown son
tried sitting on her lap when symptoms began.Hx: Diabetes, CHF, CAD,
Bilateral leg amputation.

EXAM:
CHEST  2 VIEW

[Series 1: chest lat · 0.14mm/px · 2 of 2 slices shown]
[im 1/2]
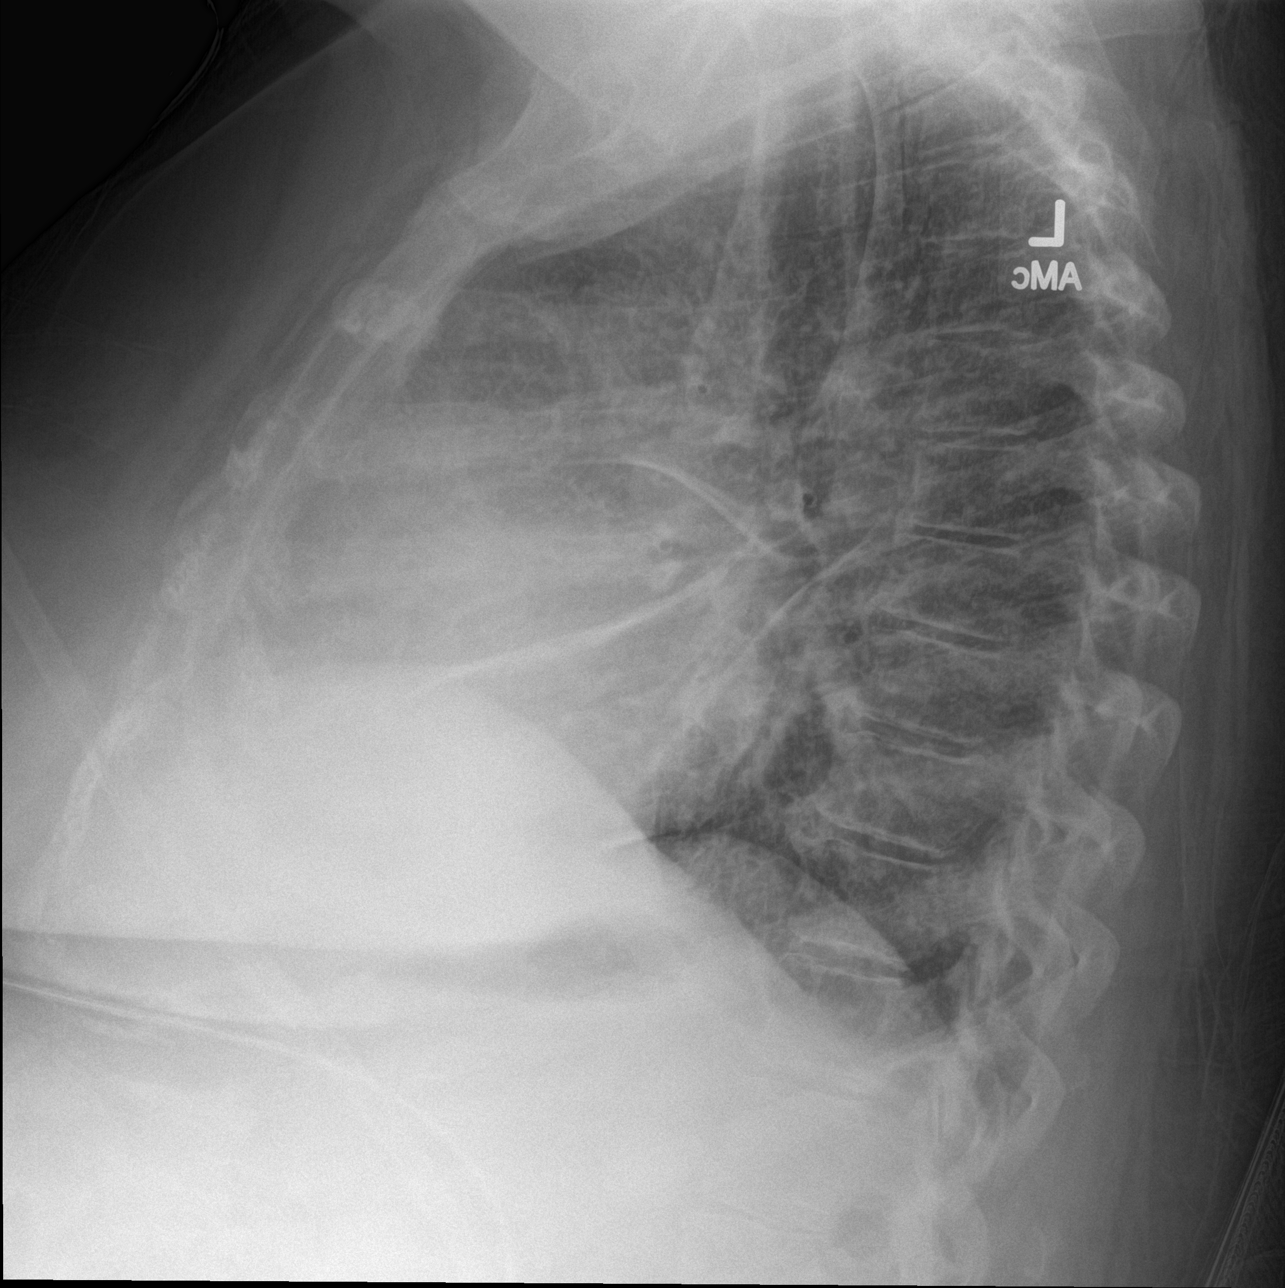
[im 2/2]
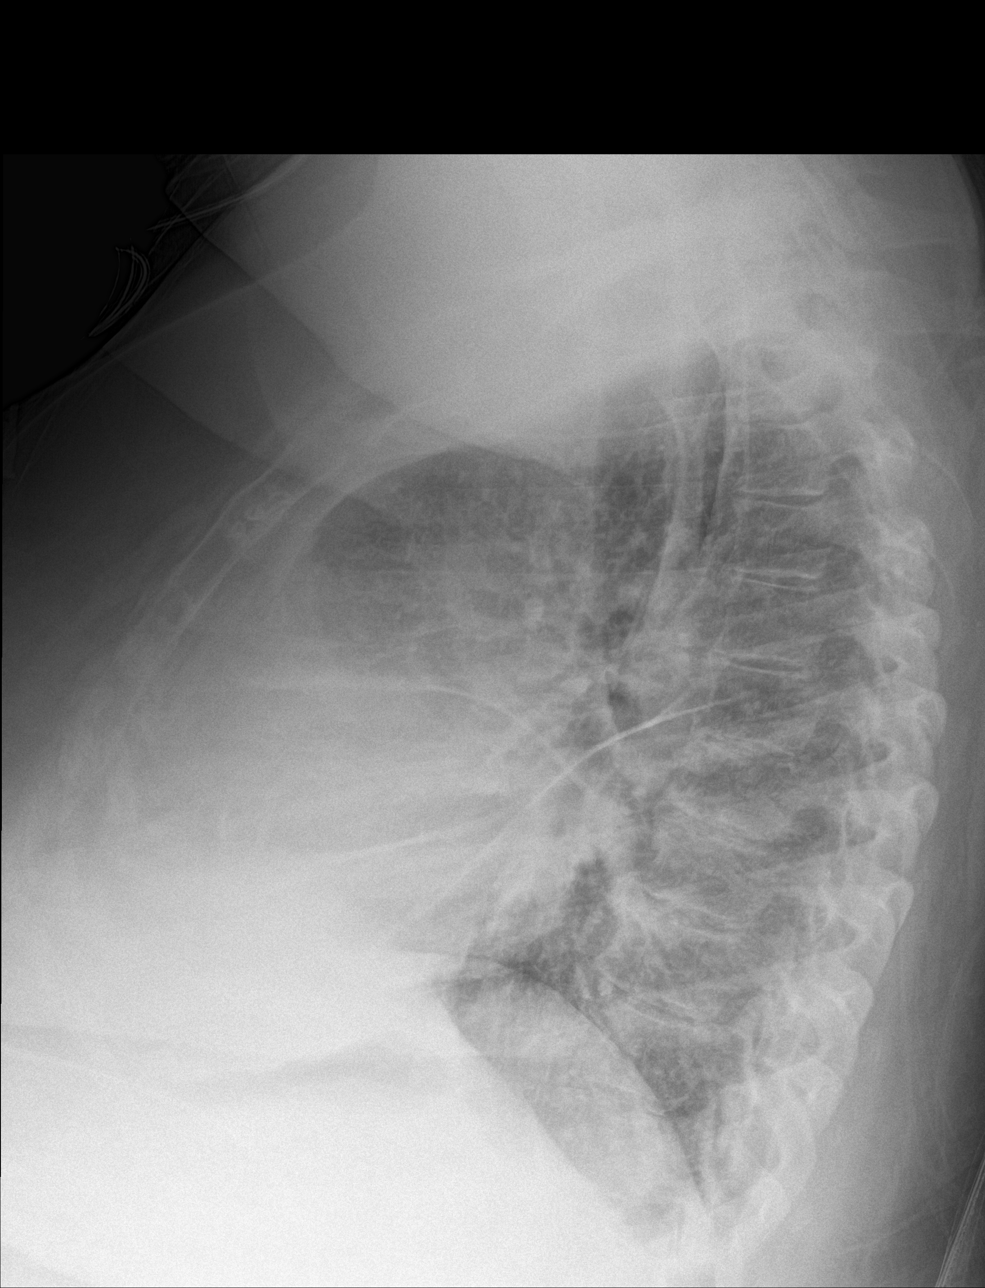

[chest ap]
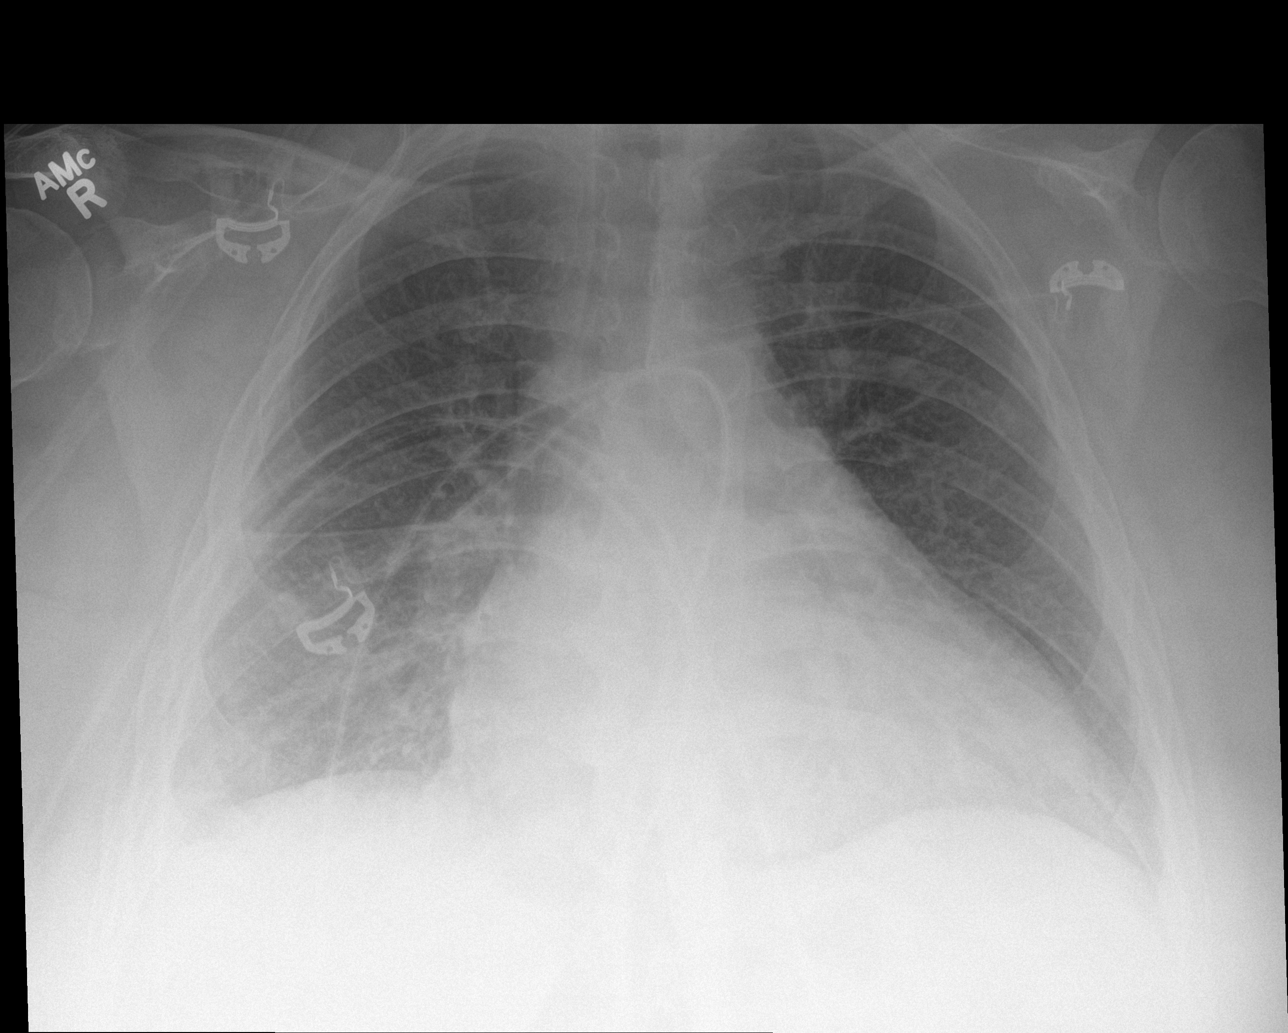

[3 of 3 positions shown; findings below may reference images not displayed]

FINDINGS: Cardiac silhouette is mildly enlarged. No mediastinal or hilar
masses or evidence of adenopathy.

Lungs show interstitial thickening including thickening of the
fissures. No focal lung consolidation. No pleural effusion or
pneumothorax.

Bony thorax is intact.
IMPRESSION: Findings most consistent with mild congestive heart failure. No
evidence of pneumonia.

## 2018-12-19 IMAGING — DX DG CHEST 2V
2 series · 2 of 2 positions shown · non-contrast
Comparison: February 20, 2015

CLINICAL DATA: Cough and congestion with shortness of breath for 3
weeks

EXAM:
CHEST  2 VIEW

[chest lat]
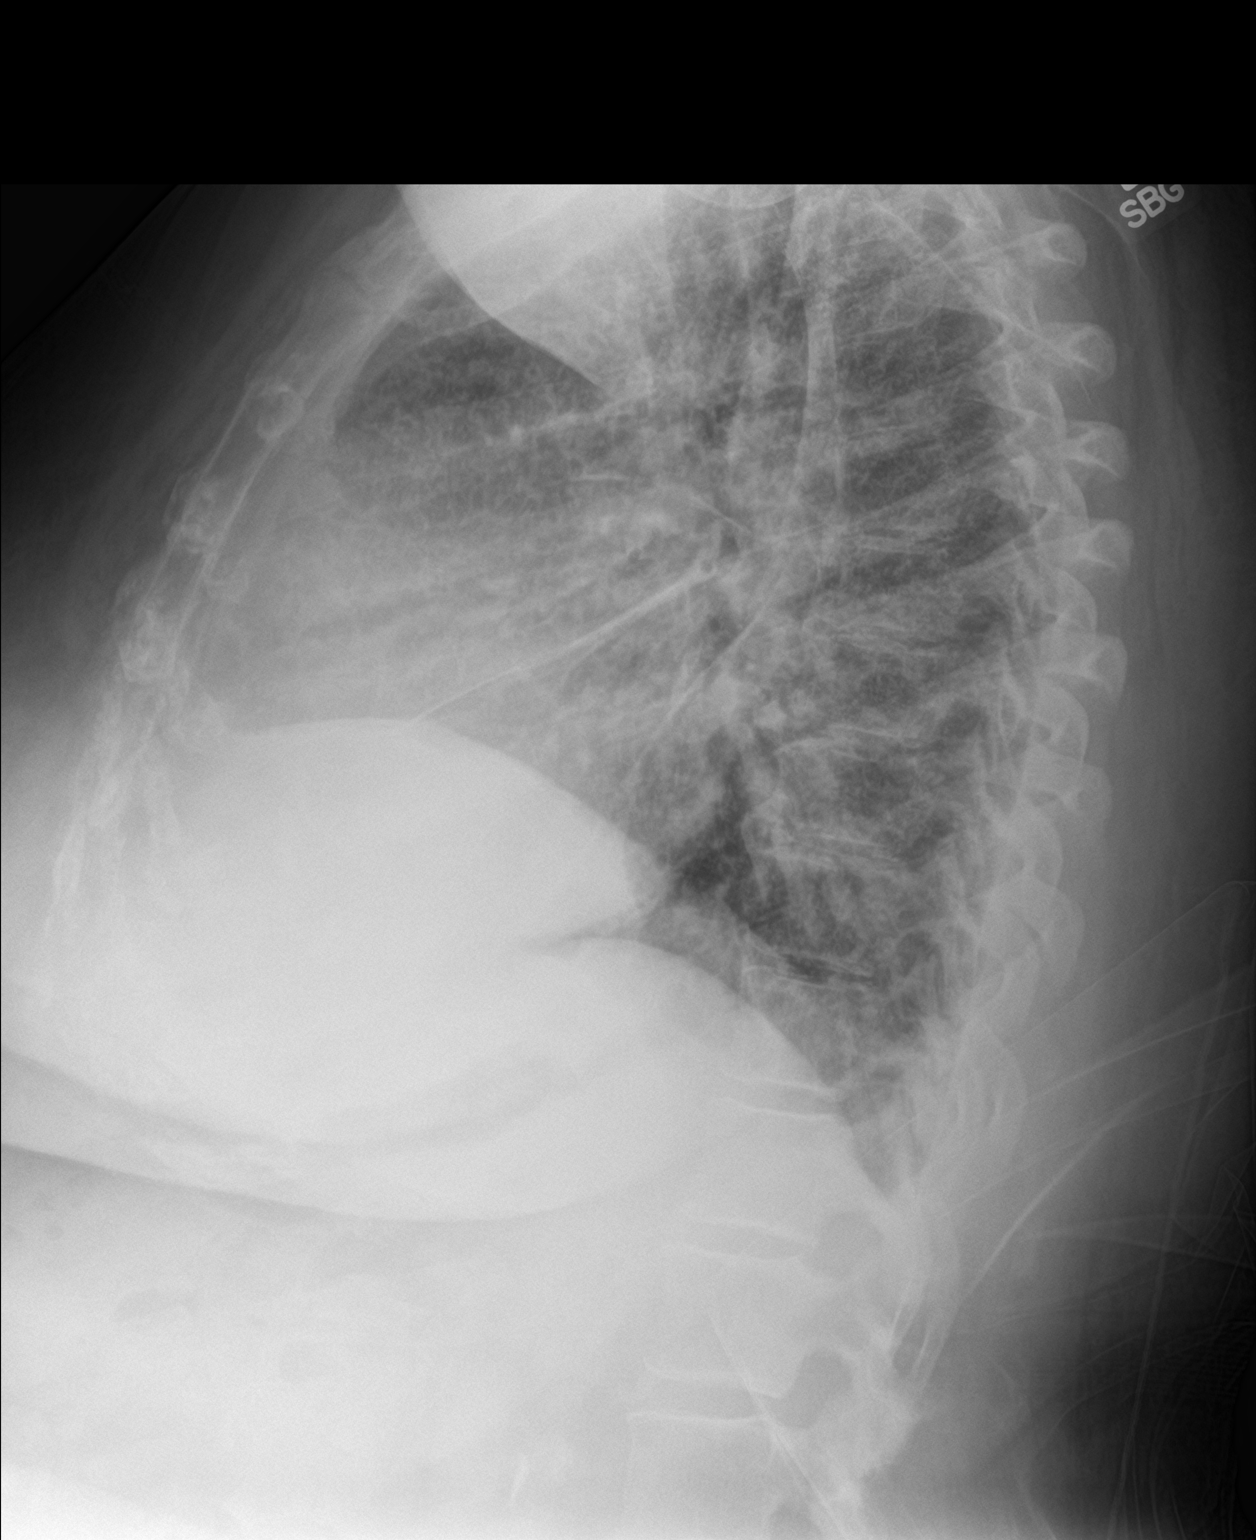

[chest ap]
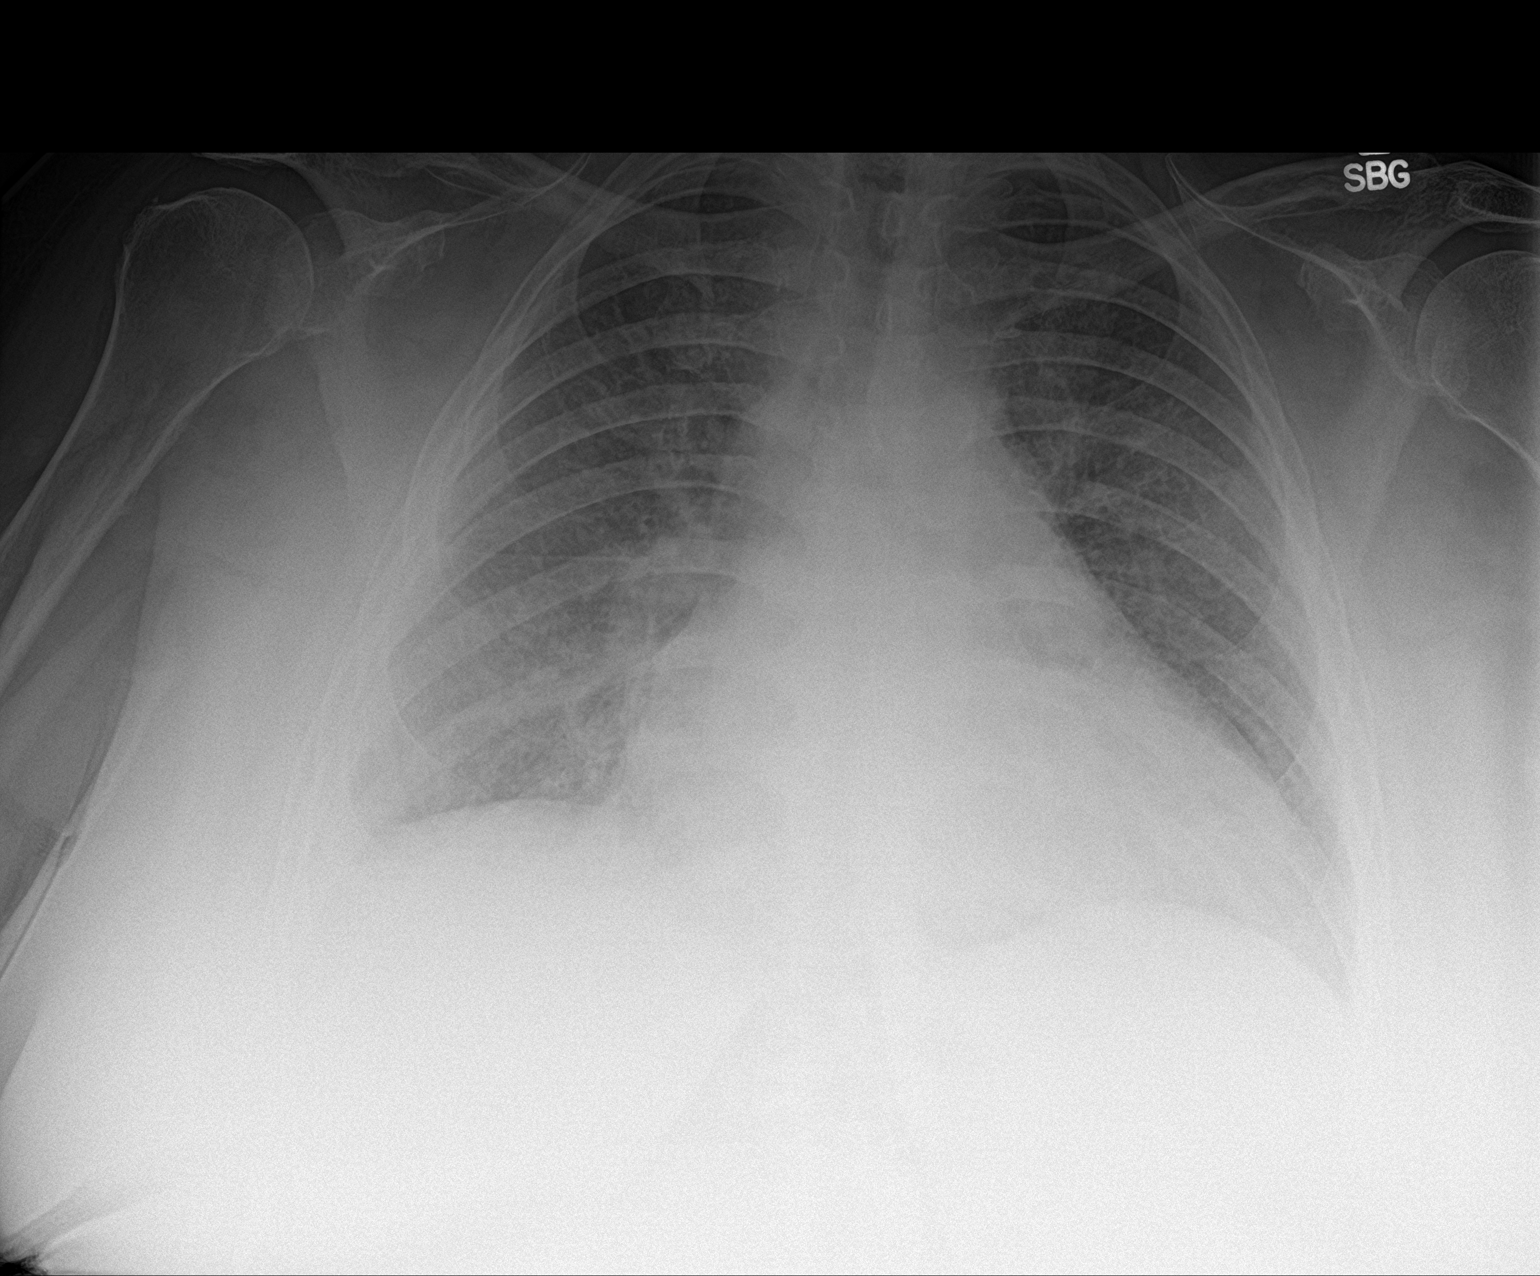

[2 of 2 positions shown; findings below may reference images not displayed]

FINDINGS: There is persistent interstitial pulmonary edema. There is no
airspace consolidation. There is cardiomegaly. Pulmonary vascularity
is overall within normal limits. There is atherosclerotic
calcification in the aorta. No adenopathy. There is degenerative
change in the lower thoracic region.
IMPRESSION: There is a degree of congestive heart failure, which based on prior
study may be chronic. No airspace consolidation. There is aortic
atherosclerosis. No adenopathy.

## 2019-05-04 IMAGING — DX DG CHEST 1V PORT
2 series · 2 of 2 positions shown · non-contrast
Comparison: Chest x-ray dated 01/22/2017.

CLINICAL DATA: CHF.

EXAM:
PORTABLE CHEST 1 VIEW

[chest ap (1 of 2)]
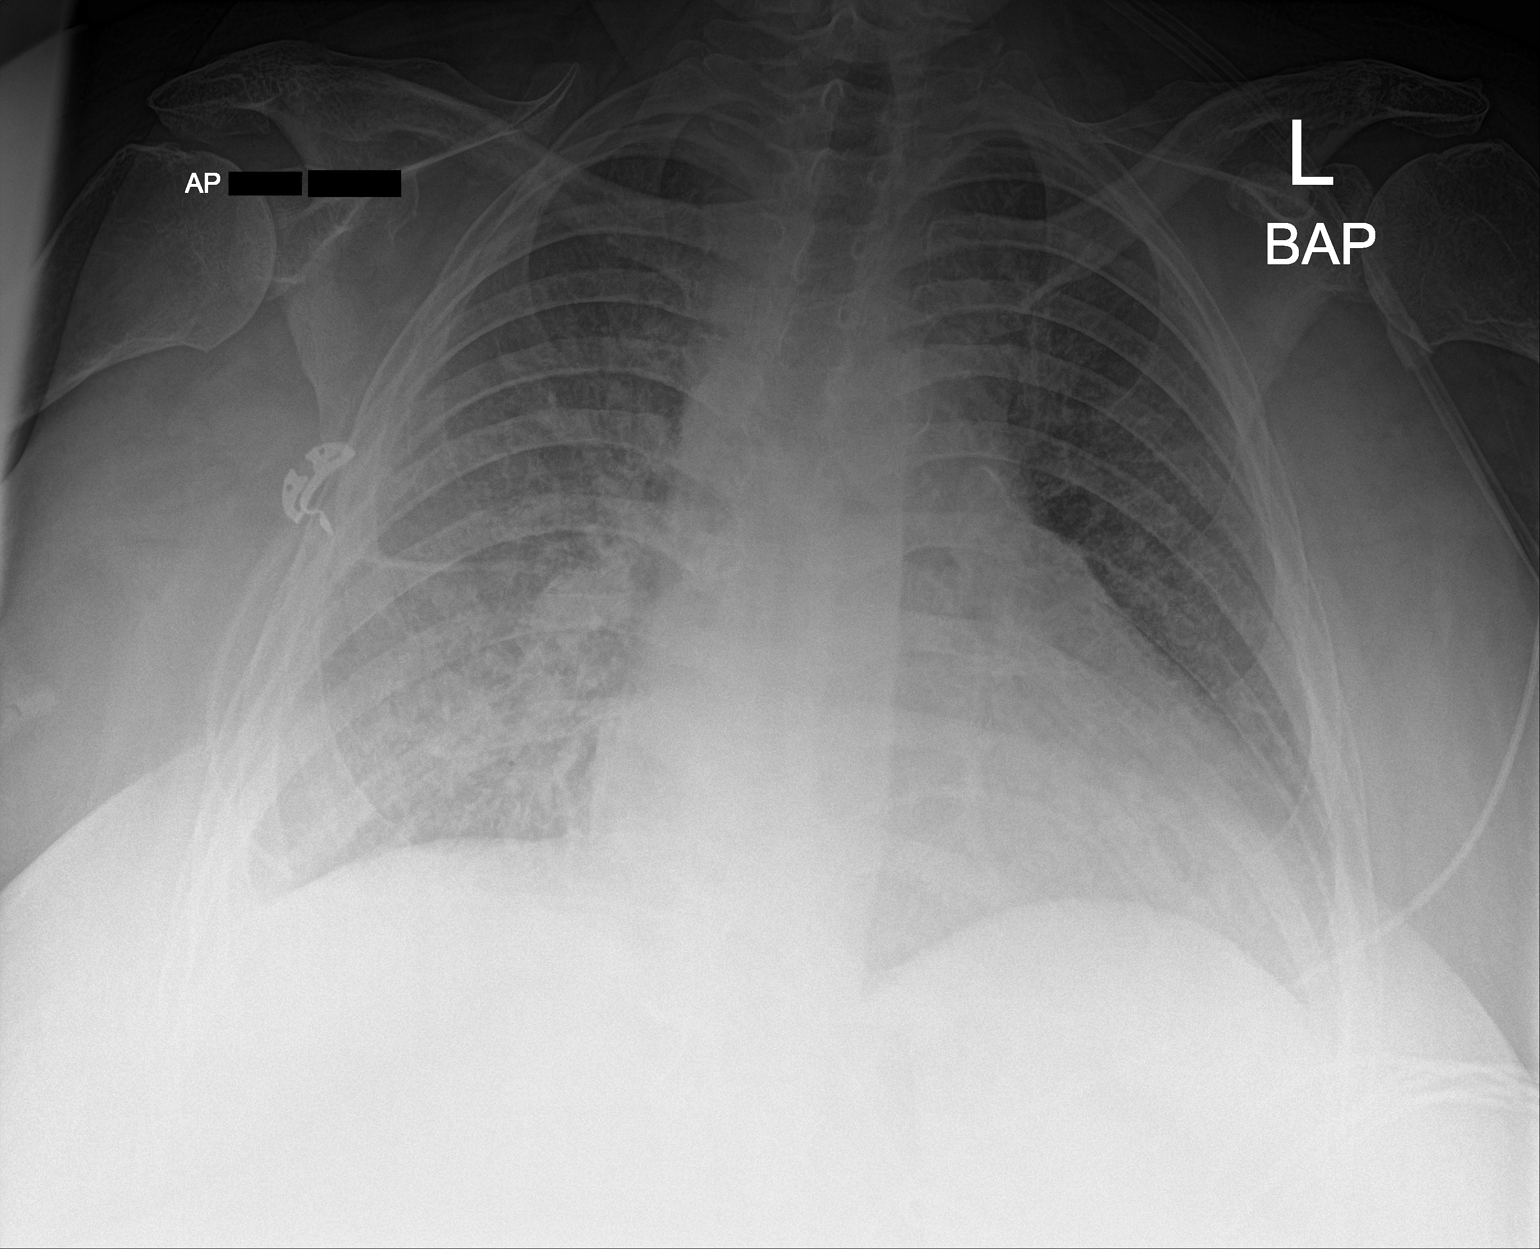

[chest ap (2 of 2)]
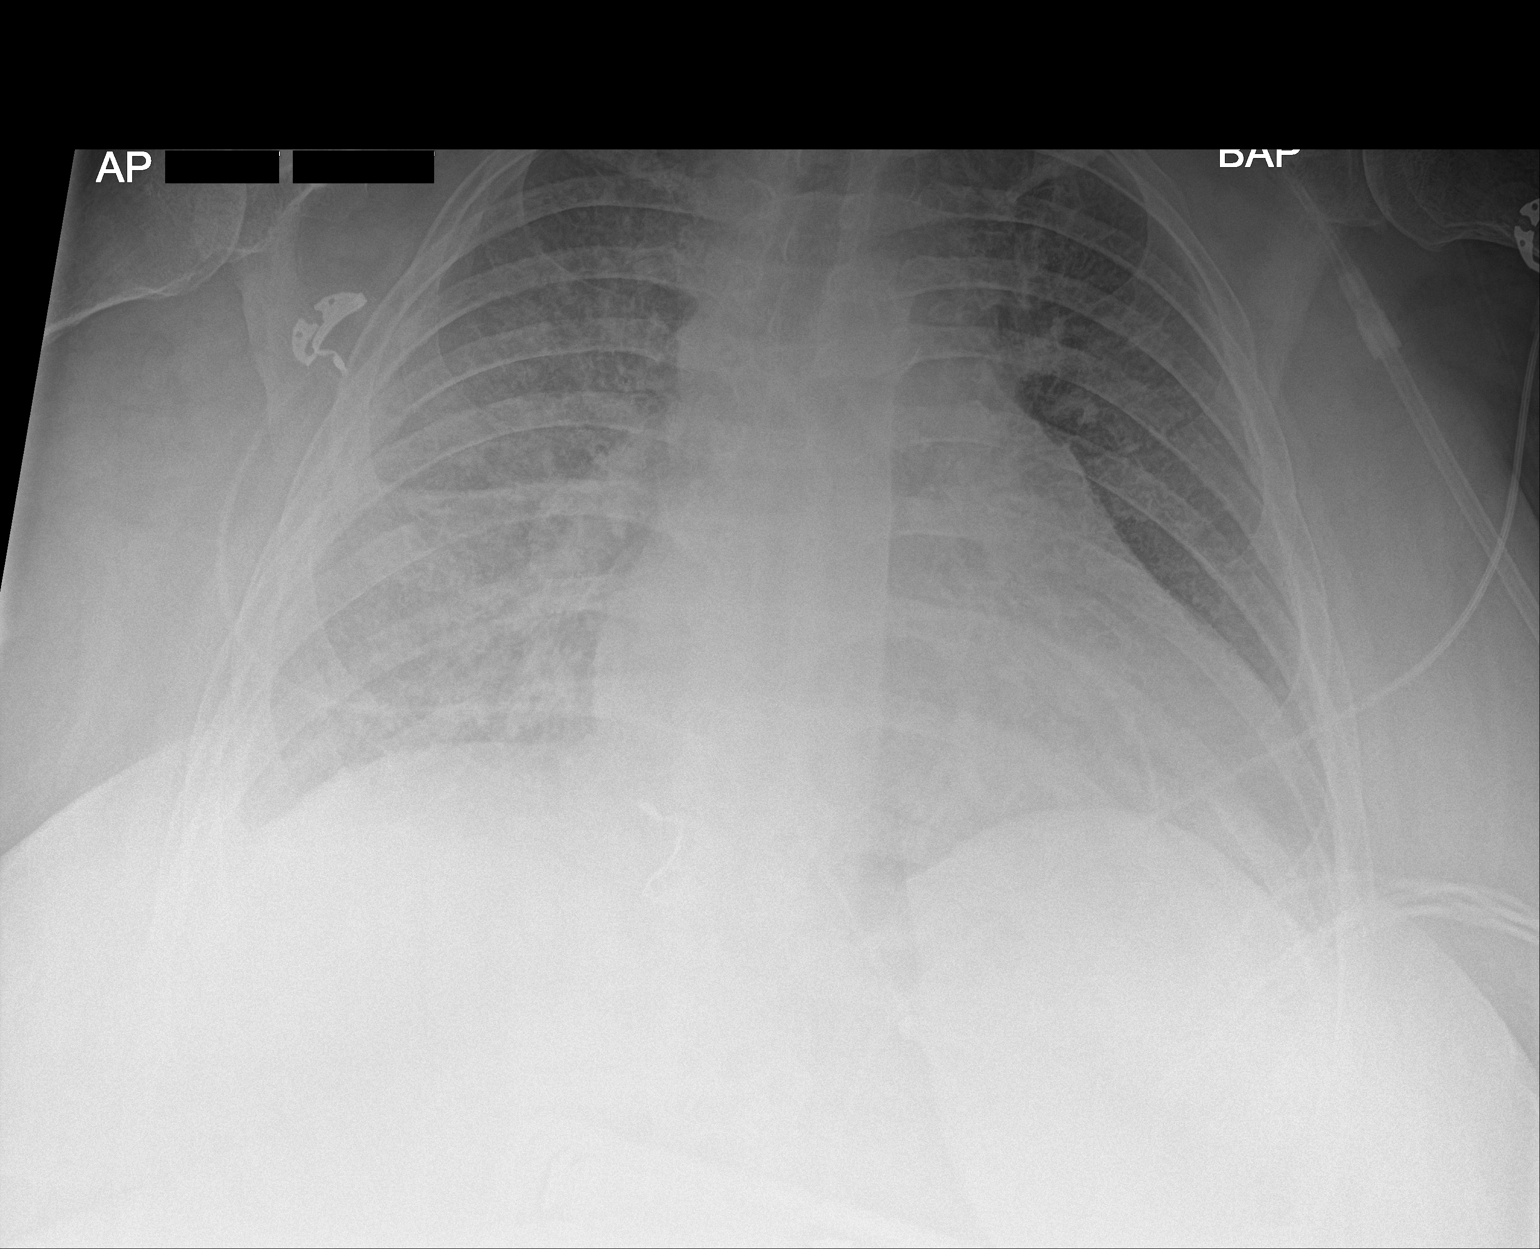

[2 of 2 positions shown; findings below may reference images not displayed]

FINDINGS: Stable cardiomegaly. Again noted is central pulmonary vascular
congestion and bilateral interstitial edema, stable. Probable small
right pleural effusion.
IMPRESSION: Continued CHF, stable appearance compared to chest x-ray of
01/22/2017.
